# Patient Record
Sex: Male | Born: 1975
Health system: Southern US, Community
[De-identification: ages and names within clinical notes are randomized; demographics above are authoritative.]

## PROBLEM LIST (undated history)

## (undated) DIAGNOSIS — I10 Essential (primary) hypertension: Secondary | ICD-10-CM

## (undated) DIAGNOSIS — J45909 Unspecified asthma, uncomplicated: Secondary | ICD-10-CM

## (undated) DIAGNOSIS — Z9119 Patient's noncompliance with other medical treatment and regimen: Secondary | ICD-10-CM

## (undated) DIAGNOSIS — Z91199 Patient's noncompliance with other medical treatment and regimen due to unspecified reason: Secondary | ICD-10-CM

## (undated) DIAGNOSIS — I5042 Chronic combined systolic (congestive) and diastolic (congestive) heart failure: Secondary | ICD-10-CM

## (undated) DIAGNOSIS — F1910 Other psychoactive substance abuse, uncomplicated: Secondary | ICD-10-CM

## (undated) DIAGNOSIS — I2510 Atherosclerotic heart disease of native coronary artery without angina pectoris: Secondary | ICD-10-CM

## (undated) HISTORY — PX: NECK SURGERY: SHX720

## (undated) HISTORY — PX: WISDOM TOOTH EXTRACTION: SHX21

---

## 2001-04-10 ENCOUNTER — Emergency Department (HOSPITAL_COMMUNITY): Admission: EM | Admit: 2001-04-10 | Discharge: 2001-04-10 | Payer: Self-pay | Admitting: Emergency Medicine

## 2002-04-14 ENCOUNTER — Emergency Department (HOSPITAL_COMMUNITY): Admission: EM | Admit: 2002-04-14 | Discharge: 2002-04-14 | Payer: Self-pay

## 2003-06-18 ENCOUNTER — Emergency Department (HOSPITAL_COMMUNITY): Admission: EM | Admit: 2003-06-18 | Discharge: 2003-06-18 | Payer: Self-pay | Admitting: Emergency Medicine

## 2005-04-10 ENCOUNTER — Emergency Department (HOSPITAL_COMMUNITY): Admission: EM | Admit: 2005-04-10 | Discharge: 2005-04-11 | Payer: Self-pay | Admitting: Emergency Medicine

## 2007-03-03 ENCOUNTER — Emergency Department (HOSPITAL_COMMUNITY): Admission: EM | Admit: 2007-03-03 | Discharge: 2007-03-03 | Payer: Self-pay | Admitting: Emergency Medicine

## 2007-08-27 ENCOUNTER — Other Ambulatory Visit: Payer: Self-pay

## 2007-08-27 ENCOUNTER — Emergency Department: Payer: Self-pay | Admitting: Emergency Medicine

## 2011-10-02 ENCOUNTER — Encounter (HOSPITAL_COMMUNITY): Payer: Self-pay | Admitting: *Deleted

## 2011-10-02 ENCOUNTER — Emergency Department (HOSPITAL_COMMUNITY)
Admission: EM | Admit: 2011-10-02 | Discharge: 2011-10-02 | Disposition: A | Discharge status: Home / Self Care | Payer: Self-pay | Attending: Emergency Medicine | Admitting: Emergency Medicine

## 2011-10-02 DIAGNOSIS — J020 Streptococcal pharyngitis: Secondary | ICD-10-CM

## 2011-10-02 DIAGNOSIS — I10 Essential (primary) hypertension: Secondary | ICD-10-CM

## 2011-10-02 DIAGNOSIS — J029 Acute pharyngitis, unspecified: Secondary | ICD-10-CM | POA: Insufficient documentation

## 2011-10-02 LAB — RAPID STREP SCREEN (MED CTR MEBANE ONLY): Streptococcus, Group A Screen (Direct): POSITIVE — AB

## 2011-10-02 MED ORDER — PENICILLIN G BENZATHINE 1200000 UNIT/2ML IM SUSP
1.2000 10*6.[IU] | Freq: Once | INTRAMUSCULAR | Status: AC
  Administered 2011-10-02: 1.2 10*6.[IU] via INTRAMUSCULAR
  Filled 2011-10-02: qty 2

## 2011-10-02 MED ORDER — SODIUM CHLORIDE 0.9 % IV BOLUS (SEPSIS): 1000.0000 mL | Freq: Once | INTRAVENOUS | Status: DC

## 2011-10-02 MED ORDER — LIDOCAINE VISCOUS 2 % MT SOLN: 20.0000 mL | OROMUCOSAL | Status: AC | PRN

## 2011-10-02 MED ORDER — HYDROCHLOROTHIAZIDE 25 MG PO TABS: 25.0000 mg | ORAL_TABLET | Freq: Every day | ORAL | Status: DC

## 2011-10-02 MED ORDER — METOPROLOL TARTRATE 25 MG PO TABS: 25.0000 mg | ORAL_TABLET | Freq: Two times a day (BID) | ORAL | Status: DC

## 2011-10-02 NOTE — ED Provider Notes (Signed)
Medical screening examination/treatment/procedure(s) were performed by non-physician practitioner and as supervising physician I was immediately available for consultation/collaboration.   Lyanne Co, MD 10/02/11 [phone]

## 2011-10-02 NOTE — ED Notes (Signed)
C/o sore throat, fever, night sweat, chills. Out of BP med x 1 month, bp 159/110 at ED

## 2011-10-02 NOTE — Discharge Instructions (Signed)
Strep Throat Strep throat is an infection of the throat caused by a bacteria named Streptococcus pyogenes. Your caregiver may call the infection streptococcal "tonsillitis" or "pharyngitis" depending on whether there are signs of inflammation in the tonsils or back of the throat. Strep throat is most common in children from 75 to 38 years old during the cold months of the year, but it can occur in people of any age during any season. This infection is spread from person to person (contagious) through coughing, sneezing, or other close contact. SYMPTOMS   Fever or chills.   Painful, swollen, red tonsils or throat.   Pain or difficulty when swallowing.   White or yellow spots on the tonsils or throat.   Swollen, tender lymph nodes or "glands" of the neck or under the jaw.   Red rash all over the body (rare).  DIAGNOSIS  Many different infections can cause the same symptoms. A test must be done to confirm the diagnosis so the right treatment can be given. A "rapid strep test" can help your caregiver make the diagnosis in a few minutes. If this test is not available, a light swab of the infected area can be used for a throat culture test. If a throat culture test is done, results are usually available in a day or two. TREATMENT  Strep throat is treated with antibiotic medicine. HOME CARE INSTRUCTIONS   Gargle with 1 tsp of salt in 1 cup of warm water, 3 to 4 times per day or as needed for comfort.   Family members who also have a sore throat or fever should be tested for strep throat and treated with antibiotics if they have the strep infection.   Make sure everyone in your household washes their hands well.   Do not share food, drinking cups, or personal items that could cause the infection to spread to others.   You may need to eat a soft food diet until your sore throat gets better.   Drink enough water and fluids to keep your urine clear or pale yellow. This will help prevent  dehydration.   Get plenty of rest.   Stay home from school, daycare, or work until you have been on antibiotics for 24 hours.   Only take over-the-counter or prescription medicines for pain, discomfort, or fever as directed by your caregiver.   If antibiotics are prescribed, take them as directed. Finish them even if you start to feel better.  SEEK MEDICAL CARE IF:   The glands in your neck continue to enlarge.   You develop a rash, cough, or earache.   You cough up green, yellow-brown, or bloody sputum.   You have pain or discomfort not controlled by medicines.   Your problems seem to be getting worse rather than better.  SEEK IMMEDIATE MEDICAL CARE IF:   You develop any new symptoms such as vomiting, severe headache, stiff or painful neck, chest pain, shortness of breath, or trouble swallowing.   You develop severe throat pain, drooling, or changes in your voice.   You develop swelling of the neck, or the skin on the neck becomes red and tender.   You have a fever.   You develop signs of dehydration, such as fatigue, dry mouth, and decreased urination.   You become increasingly sleepy, or you cannot wake up completely.  Document Released: 03/25/2000 Document Revised: 03/17/2011 Document Reviewed: 05/27/2010 [facility] Patient Information 2012 [place], [place].   RESOURCE GUIDE  Dental Problems  Patients with Medicaid:  [facility]                     [phone] [address]                                           Phone:  [phone]                                                  If unable to pay or uninsured, contact:  [facility] or [facility] to become qualified for the adult dental clinic.  Chronic Pain Problems Contact [facility]  [phone] Patients need to be referred by their primary care doctor.  Insufficient Money for Medicine Contact [employer] Way:  call "211" or [facility] (747)383-6987.  No  Primary Care Doctor Call [facility]  [phone] Other agencies that provide inexpensive medical care    [facility]  [phone]    [facility]  [phone]    [facility]  [phone]    [facility]  [phone]    [facility]  [phone]    [facility]  [phone]  Substance Abuse Resources [facility]  [phone] [facility] [phone] [address] [phone] Floydene Flock [phone] Residential & Outpatient Substance Abuse Program  [phone]  Psychological Services [facility] Behavioral Health  [phone] [facility]  [phone] [facility] Mental Health   [phone] (emergency services 4175512906)  Abuse/Neglect [facility] Child Abuse Hotline (772) 274-6349 [facility] Child Abuse Hotline 939-158-2053 (After Hours)  Emergency Shelter [facility] [phone]  Maternity [facility] [facility] [phone] [facility] [phone]  MRSA Hotline #:   434 441 2446    [facility] Resources  [facility]  [employer]                           [facility] [address] [place]                     [address]         [address]  Blondell Reveal Phone:  [phone]                                  Phone:  [phone]                   Phone:  [phone]  [facility] Phone:  [phone]  [facility] Child Abuse Hotline 518-612-2142 [phone] (After Hours)

## 2011-10-02 NOTE — ED Provider Notes (Signed)
History     CSN: [id number]  Arrival date & time 10/02/11  1715   First MD Initiated Contact with Patient 10/02/11 1734      5:45 PM HPI Patient reports a sore throat difficulty swallowing and fever for several days. Denies cough chest pain or shortness of breath. Also reports he is out of his blood pressure medication and he does not have a primary care doctor yet.  Patient is a 36 y.o. male presenting with pharyngitis. The history is provided by the patient.  Sore Throat This is a new problem. The current episode started in the past 7 days. The problem occurs constantly. The problem has been gradually worsening. Associated symptoms include chills, a fever, neck pain and a sore throat. Pertinent negatives include no abdominal pain, congestion, coughing, fatigue, headaches, nausea, rash, vomiting or weakness. Nothing aggravates the symptoms. He has tried nothing for the symptoms.    No past medical history on file.  No past surgical history on file.  No family history on file.  History  Substance Use Topics  . Smoking status: Not on file  . Smokeless tobacco: Not on file  . Alcohol Use: Not on file      Review of Systems  Constitutional: Positive for fever and chills. Negative for fatigue.  HENT: Positive for sore throat, trouble swallowing and neck pain. Negative for congestion.   Respiratory: Negative for cough.   Gastrointestinal: Negative for nausea, vomiting and abdominal pain.  Skin: Negative for rash.  Neurological: Negative for weakness and headaches.  All other systems reviewed and are negative.    Allergies  Review of patient's allergies indicates no known allergies.  Home Medications   Current Outpatient Rx  Name Route Sig Dispense Refill  . ALBUTEROL SULFATE HFA 108 (90 BASE) MCG/ACT IN AERS Inhalation Inhale 2 puffs into the lungs every 6 (six) hours as needed. Shortness of breath      BP 159/110  Pulse 119  Temp 100.3 F (37.9 C) (Oral)  Resp 20   SpO2 98%  Physical Exam  Constitutional: He is oriented to person, place, and time. He appears well-developed and well-nourished.  HENT:  Head: Normocephalic and atraumatic.  Right Ear: Tympanic membrane, external ear and ear canal normal.  Left Ear: Tympanic membrane, external ear and ear canal normal.  Nose: Nose normal.  Mouth/Throat: Uvula is midline and mucous membranes are normal. Posterior oropharyngeal erythema present. No oropharyngeal exudate.  Eyes: Conjunctivae are normal. Pupils are equal, round, and reactive to light.  Neck: Normal range of motion. Neck supple.  Cardiovascular: Normal rate, regular rhythm and normal heart sounds.   Pulmonary/Chest: Effort normal and breath sounds normal.  Abdominal: Soft. Bowel sounds are normal.  Lymphadenopathy:    He has no cervical adenopathy.  Neurological: He is alert and oriented to person, place, and time.  Skin: Skin is warm and dry. No rash noted. No erythema. No pallor.  Psychiatric: He has a normal mood and affect. His behavior is normal.    ED Course  Procedures  Results for orders placed during the hospital encounter of 10/02/11  RAPID STREP SCREEN      Component Value Range   Streptococcus, Group A Screen (Direct) POSITIVE (*) NEGATIVE    MDM   Patient given Bicillin. Also given prescription for Lopressor and HCTZ. Have given resource guide to find primary care Patient voices understanding and is ready for d/c      Thomasene Lot, PA-C 10/02/11 1822

## 2011-11-03 ENCOUNTER — Encounter (HOSPITAL_COMMUNITY): Payer: Self-pay | Admitting: *Deleted

## 2011-11-03 ENCOUNTER — Emergency Department (INDEPENDENT_AMBULATORY_CARE_PROVIDER_SITE_OTHER)
Admission: EM | Admit: 2011-11-03 | Discharge: 2011-11-03 | Disposition: A | Discharge status: Home / Self Care | Payer: Self-pay | Source: Home / Self Care | Attending: Emergency Medicine | Admitting: Emergency Medicine

## 2011-11-03 DIAGNOSIS — N342 Other urethritis: Secondary | ICD-10-CM

## 2011-11-03 MED ORDER — AZITHROMYCIN 250 MG PO TABS
ORAL_TABLET | ORAL | Status: AC
  Filled 2011-11-03: qty 4

## 2011-11-03 MED ORDER — CEFTRIAXONE SODIUM 250 MG IJ SOLR
250.0000 mg | Freq: Once | INTRAMUSCULAR | Status: AC
  Administered 2011-11-03: 250 mg via INTRAMUSCULAR

## 2011-11-03 MED ORDER — AZITHROMYCIN 250 MG PO TABS
1000.0000 mg | ORAL_TABLET | Freq: Once | ORAL | Status: AC
  Administered 2011-11-03: 1000 mg via ORAL

## 2011-11-03 MED ORDER — CEFTRIAXONE SODIUM 250 MG IJ SOLR
INTRAMUSCULAR | Status: AC
  Filled 2011-11-03: qty 250

## 2011-11-03 NOTE — ED Notes (Signed)
Pt  Has  Symptoms  Of  Clear  Penile  Discharge    X  1  Week           Wants  To  Be  rx  For poss  std

## 2011-11-03 NOTE — ED Provider Notes (Signed)
History     CSN: [id number]  Arrival date & time 11/03/11  1904   First MD Initiated Contact with Patient 11/03/11 2004      Chief Complaint  Patient presents with  . Penile Discharge    (Consider location/radiation/quality/duration/timing/severity/associated sxs/prior treatment) HPI Comments: Patient presents tonight at urgent care concerned that he might have acquired her a sexual transmitted illness as he is aware now that she a previous girlfriend was "falling around", patient describes that about a week ago he did notice a little bit of a clear discharge but sometimes expresses a tingling sensation when he urinates and some discomfort, although he describes as not every day. He is now with a new girlfriend that has actually been seen tonight here at urgent care for some gynecological concerns. He describes it he thinks he could have been exposed to an STD and was to be treated today. Patient denies any rashes, or pelvic pain.  Patient is a 36 y.o. male presenting with penile discharge. The history is provided by the patient.  Penile Discharge This is a new problem. The current episode started more than 1 week ago. Pertinent negatives include no abdominal pain. Exacerbated by: Urinating sometimes. He has tried nothing for the symptoms. The treatment provided no relief.    History reviewed. No pertinent past medical history.  History reviewed. No pertinent past surgical history.  No family history on file.  History  Substance Use Topics  . Smoking status: Unknown If Ever Smoked  . Smokeless tobacco: Not on file  . Alcohol Use: Yes      Review of Systems  Constitutional: Negative for chills, activity change and appetite change.  Gastrointestinal: Negative for abdominal pain.  Genitourinary: Positive for discharge. Negative for urgency, decreased urine volume, penile swelling, scrotal swelling, penile pain and testicular pain.    Allergies  Review of patient's allergies  indicates no known allergies.  Home Medications   Current Outpatient Rx  Name Route Sig Dispense Refill  . ALBUTEROL SULFATE HFA 108 (90 BASE) MCG/ACT IN AERS Inhalation Inhale 2 puffs into the lungs every 6 (six) hours as needed. Shortness of breath    . HYDROCHLOROTHIAZIDE 25 MG PO TABS Oral Take 1 tablet (25 mg total) by mouth daily. 30 tablet 0  . METOPROLOL TARTRATE 25 MG PO TABS Oral Take 1 tablet (25 mg total) by mouth 2 (two) times daily. 30 tablet 0    BP 131/97  Pulse 74  Temp 98.4 F (36.9 C) (Oral)  Resp 20  SpO2 100%  Physical Exam  Vitals reviewed. Constitutional: He appears well-developed and well-nourished.  Genitourinary: Penis normal. No penile tenderness.  Skin: No rash noted. No erythema.    ED Course  Procedures (including critical care time)   Labs Reviewed  GC/CHLAMYDIA PROBE AMP, GENITAL   No results found.   1. Urethritis       MDM  Patient reports penile discharge of clear character. Patient opted and wanted to be treated empirically. DNA probe obtained for both gonorrhea and chlamydia. Patient was treated today with both azithromycin 1 g and Septra zone 250 mg IM.        Jimmie Molly, MD 11/03/11 2052

## 2012-03-12 ENCOUNTER — Encounter (HOSPITAL_COMMUNITY): Payer: Self-pay | Admitting: Emergency Medicine

## 2012-03-12 ENCOUNTER — Emergency Department (HOSPITAL_COMMUNITY)
Admission: EM | Admit: 2012-03-12 | Discharge: 2012-03-12 | Disposition: A | Discharge status: Home / Self Care | Payer: Self-pay | Attending: Emergency Medicine | Admitting: Emergency Medicine

## 2012-03-12 DIAGNOSIS — G479 Sleep disorder, unspecified: Secondary | ICD-10-CM | POA: Insufficient documentation

## 2012-03-12 DIAGNOSIS — F17200 Nicotine dependence, unspecified, uncomplicated: Secondary | ICD-10-CM | POA: Insufficient documentation

## 2012-03-12 DIAGNOSIS — R110 Nausea: Secondary | ICD-10-CM | POA: Insufficient documentation

## 2012-03-12 DIAGNOSIS — J069 Acute upper respiratory infection, unspecified: Secondary | ICD-10-CM | POA: Insufficient documentation

## 2012-03-12 DIAGNOSIS — Z79899 Other long term (current) drug therapy: Secondary | ICD-10-CM | POA: Insufficient documentation

## 2012-03-12 DIAGNOSIS — R509 Fever, unspecified: Secondary | ICD-10-CM | POA: Insufficient documentation

## 2012-03-12 DIAGNOSIS — R05 Cough: Secondary | ICD-10-CM | POA: Insufficient documentation

## 2012-03-12 DIAGNOSIS — R630 Anorexia: Secondary | ICD-10-CM | POA: Insufficient documentation

## 2012-03-12 DIAGNOSIS — J3489 Other specified disorders of nose and nasal sinuses: Secondary | ICD-10-CM | POA: Insufficient documentation

## 2012-03-12 DIAGNOSIS — J029 Acute pharyngitis, unspecified: Secondary | ICD-10-CM | POA: Insufficient documentation

## 2012-03-12 DIAGNOSIS — I10 Essential (primary) hypertension: Secondary | ICD-10-CM | POA: Insufficient documentation

## 2012-03-12 DIAGNOSIS — R059 Cough, unspecified: Secondary | ICD-10-CM | POA: Insufficient documentation

## 2012-03-12 DIAGNOSIS — R5381 Other malaise: Secondary | ICD-10-CM | POA: Insufficient documentation

## 2012-03-12 HISTORY — DX: Essential (primary) hypertension: I10

## 2012-03-12 MED ORDER — ONDANSETRON 8 MG PO TBDP
8.0000 mg | ORAL_TABLET | Freq: Once | ORAL | Status: AC
Start: 2012-03-12 — End: 2012-03-12
  Administered 2012-03-12: 8 mg via ORAL
  Filled 2012-03-12: qty 1

## 2012-03-12 MED ORDER — KETOROLAC TROMETHAMINE 30 MG/ML IJ SOLN
30.0000 mg | Freq: Once | INTRAMUSCULAR | Status: AC
  Administered 2012-03-12: 30 mg via INTRAVENOUS
  Filled 2012-03-12: qty 1

## 2012-03-12 MED ORDER — SODIUM CHLORIDE 0.9 % IV BOLUS (SEPSIS)
1000.0000 mL | Freq: Once | INTRAVENOUS | Status: AC
  Administered 2012-03-12: 1000 mL via INTRAVENOUS

## 2012-03-12 MED ORDER — DEXAMETHASONE SODIUM PHOSPHATE 10 MG/ML IJ SOLN
10.0000 mg | Freq: Once | INTRAMUSCULAR | Status: AC
  Administered 2012-03-12: 10 mg via INTRAVENOUS
  Filled 2012-03-12: qty 1

## 2012-03-12 MED ORDER — BENZOCAINE (TOPICAL) 20 % EX AERO: INHALATION_SPRAY | Freq: Four times a day (QID) | CUTANEOUS | Status: DC | PRN

## 2012-03-12 MED ORDER — PREDNISONE 10 MG PO TABS: ORAL_TABLET | ORAL | Status: DC

## 2012-03-12 MED ORDER — HYDROCODONE-HOMATROPINE 5-1.5 MG/5ML PO SYRP
5.0000 mL | ORAL_SOLUTION | Freq: Four times a day (QID) | ORAL | Status: DC | PRN
Start: 2012-03-12 — End: 2012-05-10

## 2012-03-12 NOTE — ED Notes (Signed)
Acuity changed d/t pt needing IV access for IVF and meds.  Pt unable to swallow at this time d/t sore throat.

## 2012-03-12 NOTE — ED Provider Notes (Signed)
Medical screening examination/treatment/procedure(s) were performed by non-physician practitioner and as supervising physician I was immediately available for consultation/collaboration  Lydian Chavous, MD 03/12/12 1804 

## 2012-03-12 NOTE — ED Provider Notes (Signed)
History     CSN: [id number]  Arrival date & time 03/12/12  1235   First MD Initiated Contact with Patient 03/12/12 1309      Chief Complaint  Patient presents with  . Sore Throat  . Nausea    (Consider location/radiation/quality/duration/timing/severity/associated sxs/prior treatment) The history is provided by the patient. No language interpreter was used.   36 year old male presents the emergency room with 2 days of worsening sore throat and nausea.  Patient states that he is pain with swallowing and difficulty sleeping.  He has had some subjective fevers and generalized malaise, fatigue and decreased appetite.  He denies difficulty breathing, shortness of breath.  He denies otalgia.  He does have some rhinorrhea.  He has a dry cough.  Denies fevers, chills, myalgias, arthralgias. Denies DOE, SOB, chest tightness or pressure, radiation to left arm, jaw or back, or diaphoresis. Denies dysuria, flank pain, suprapubic pain, frequency, urgency, or hematuria. Denies headaches, light headedness, weakness, visual disturbances. Denies abdominal pain, nausea, vomiting, diarrhea or constipation.    Past Medical History  Diagnosis Date  . Hypertension     History reviewed. No pertinent past surgical history.  No family history on file.  History  Substance Use Topics  . Smoking status: Current Every Day Smoker -- 1.0 packs/day    Types: Cigarettes  . Smokeless tobacco: Not on file  . Alcohol Use: Yes     Comment: occassionally      Review of Systems Ten systems are reviewed and are negative for acute change except as noted in the HPI.   Allergies  Review of patient's allergies indicates no known allergies.  Home Medications   Current Outpatient Rx  Name  Route  Sig  Dispense  Refill  . ALBUTEROL SULFATE HFA 108 (90 BASE) MCG/ACT IN AERS   Inhalation   Inhale 2 puffs into the lungs every 6 (six) hours as needed. Shortness of breath         . HYDROCHLOROTHIAZIDE 25 MG  PO TABS   Oral   Take 1 tablet (25 mg total) by mouth daily.   30 tablet   0   . METOPROLOL TARTRATE 25 MG PO TABS   Oral   Take 1 tablet (25 mg total) by mouth 2 (two) times daily.   30 tablet   0     BP 140/104  Pulse 110  Temp 98.9 F (37.2 C) (Oral)  Resp 20  SpO2 97%  Physical Exam  HENT:  Head: No trismus in the jaw.  Mouth/Throat: Uvula is midline. No uvula swelling. Oropharyngeal exudate and posterior oropharyngeal erythema present. No posterior oropharyngeal edema or tonsillar abscesses.   Physical Exam  Nursing note and vitals reviewed. Constitutional: He appears well-developed and well-nourished. No distress.  HENT:  Head: Normocephalic and atraumatic.  Eyes: Conjunctivae normal are normal. No scleral icterus.  Neck: Normal range of motion. Neck supple.  Cardiovascular: Normal rate, regular rhythm and normal heart sounds.   Pulmonary/Chest: Effort normal and breath sounds normal. No respiratory distress.  Abdominal: Soft. There is no tenderness.  Musculoskeletal: He exhibits no edema.  Neurological: He is alert.  Skin: Skin is warm and dry. He is not diaphoretic.  Psychiatric: His behavior is normal.     ED Course  Procedures (including critical care time)   Labs Reviewed  RAPID STREP SCREEN   No results found.   1. Sore throat   2. URI (upper respiratory infection)       MDM  Filed Vitals:   03/12/12 1254 03/12/12 1400  BP: 140/104 147/98  Pulse: 110 103  Temp: 98.9 F (37.2 C)   TempSrc: Oral   Resp: 20 20  SpO2: 97% 100%   Patient with some HTN and tachycardia. He was given decadron and Toradol along with a liter of fluid.  He had significant relief of symptoms.  Patient's pulse was 80 by my palpation just before discharge.  I will treat patient with supportive care and symptom relief.  Discussed return precautions.         Arthor Captain, PA-C 03/12/12 1628

## 2012-03-12 NOTE — ED Notes (Signed)
Pt presenting to ed with c/o sore throat pt states difficulty with swallowing pt states he has positive nausea no vomiting pt states his stomach feels upset but denies pain. Pt denies diarrhea at this time. Pt states onset x 2 days

## 2012-03-12 NOTE — ED Notes (Signed)
Pt reports sore throat x 2 days with difficulty swallowing and nausea.

## 2012-03-12 NOTE — Progress Notes (Signed)
CARE MANAGEMENT ED NOTE 03/12/2012  Patient:  Tim [name], Tim [name]   Account Number:  [id number]  Date Initiated:  03/12/2012  Documentation initiated by:  Edd Arbour  Subjective/Objective Assessment:   36 yr old male self pay     Subjective/Objective Assessment Detail:   [facility] liaison spoke with him Pt refused offered  [facility] services to assist with finding a [place] self pay provider     Action/Plan:   referral to [facility] liaison   Action/Plan Detail:   Anticipated DC Date:  03/12/2012     Status Recommendation to Physician:   Result of Recommendation:    Other ED Services  Consult Working Plan    DC Planning Services  Other  GCCN / P4[facility] (established/new)  PCP issues  Outpatient Services - Pt will follow up    Choice offered to / List presented to:            Status of service:  Completed, signed off  ED Comments:   ED Comments Detail:

## 2012-05-10 ENCOUNTER — Encounter (HOSPITAL_COMMUNITY): Payer: Self-pay | Admitting: Emergency Medicine

## 2012-05-10 ENCOUNTER — Emergency Department (HOSPITAL_COMMUNITY)
Admission: EM | Admit: 2012-05-10 | Discharge: 2012-05-10 | Disposition: A | Discharge status: Home / Self Care | Payer: Self-pay | Attending: Emergency Medicine | Admitting: Emergency Medicine

## 2012-05-10 DIAGNOSIS — I10 Essential (primary) hypertension: Secondary | ICD-10-CM | POA: Insufficient documentation

## 2012-05-10 DIAGNOSIS — F17200 Nicotine dependence, unspecified, uncomplicated: Secondary | ICD-10-CM | POA: Insufficient documentation

## 2012-05-10 DIAGNOSIS — R369 Urethral discharge, unspecified: Secondary | ICD-10-CM | POA: Insufficient documentation

## 2012-05-10 DIAGNOSIS — Z79899 Other long term (current) drug therapy: Secondary | ICD-10-CM | POA: Insufficient documentation

## 2012-05-10 MED ORDER — LIDOCAINE HCL (PF) 1 % IJ SOLN
INTRAMUSCULAR | Status: AC
  Administered 2012-05-10: 2 mL
  Filled 2012-05-10: qty 5

## 2012-05-10 MED ORDER — AZITHROMYCIN 250 MG PO TABS
1000.0000 mg | ORAL_TABLET | Freq: Once | ORAL | Status: AC
  Administered 2012-05-10: 1000 mg via ORAL
  Filled 2012-05-10: qty 4

## 2012-05-10 MED ORDER — CEFTRIAXONE SODIUM 250 MG IJ SOLR
250.0000 mg | Freq: Once | INTRAMUSCULAR | Status: AC
  Administered 2012-05-10: 250 mg via INTRAMUSCULAR
  Filled 2012-05-10: qty 250

## 2012-05-10 MED ORDER — METRONIDAZOLE 500 MG PO TABS
2000.0000 mg | ORAL_TABLET | Freq: Once | ORAL | Status: AC
  Administered 2012-05-10: 2000 mg via ORAL
  Filled 2012-05-10: qty 4

## 2012-05-10 NOTE — ED Notes (Addendum)
Pt requesting STD check. Pt reports had sex some time in October and the condom came off. Pt reports having clear discharge from penis ever since. Pt also request prescription for his blood pressure medications.

## 2012-05-10 NOTE — ED Provider Notes (Signed)
History     CSN: [id number]  Arrival date & time 05/10/12  1030   First MD Initiated Contact with Patient 05/10/12 1045      Chief Complaint  Patient presents with  . Penile Discharge    (Consider location/radiation/quality/duration/timing/severity/associated sxs/prior treatment) HPI Tim [name] is a 37 y.o. male who presents with complaint of clear penile discharge. States symptoms started about a week ago. States no pain with urination, no penile pain, no pain with intercourse, no pain or swelling of the scrotum. Nothing makes symptoms better or worse. States his girlfriend is having vaginal discharge, and they are both here to be evaluated. Pt has no other complaints.  Past Medical History  Diagnosis Date  . Hypertension     History reviewed. No pertinent past surgical history.  No family history on file.  History  Substance Use Topics  . Smoking status: Current Every Day Smoker -- 1.0 packs/day    Types: Cigarettes  . Smokeless tobacco: Not on file  . Alcohol Use: Yes     Comment: occassionally      Review of Systems  Constitutional: Negative for fever and chills.  Respiratory: Negative.   Cardiovascular: Negative.   Gastrointestinal: Negative for abdominal pain.  Genitourinary: Positive for discharge. Negative for dysuria, hematuria, flank pain, penile swelling, scrotal swelling, penile pain and testicular pain.    Allergies  Review of patient's allergies indicates no known allergies.  Home Medications   Current Outpatient Rx  Name  Route  Sig  Dispense  Refill  . ALBUTEROL SULFATE HFA 108 (90 BASE) MCG/ACT IN AERS   Inhalation   Inhale 2 puffs into the lungs every 6 (six) hours as needed. Shortness of breath         . BENZOCAINE 20 % EX AERO   Topical   Apply topically 4 (four) times daily as needed for pain.   57 mL   0   . HYDROCHLOROTHIAZIDE 25 MG PO TABS   Oral   Take 1 tablet (25 mg total) by mouth daily.   30 tablet   0   .  HYDROCODONE-HOMATROPINE 5-1.5 MG/5ML PO SYRP   Oral   Take 5 mLs by mouth every 6 (six) hours as needed for cough.   120 mL   0   . METOPROLOL TARTRATE 25 MG PO TABS   Oral   Take 1 tablet (25 mg total) by mouth 2 (two) times daily.   30 tablet   0   . PREDNISONE 10 MG PO TABS      Take 3 tablets starting wed 12/4. Take 2 tablets every day for the next 2 days Take 1 tablet  For the last 2 days.   9 tablet   0     BP 141/87  Pulse 86  Temp 97.4 F (36.3 C) (Oral)  Resp 18  SpO2 99%  Physical Exam  Nursing note and vitals reviewed. Constitutional: He is oriented to person, place, and time. He appears well-developed and well-nourished. No distress.  Cardiovascular: Normal rate, regular rhythm and normal heart sounds.   Pulmonary/Chest: Effort normal and breath sounds normal. No respiratory distress. He has no wheezes. He has no rales.  Abdominal: Soft. Bowel sounds are normal. He exhibits no distension. There is no tenderness.  Genitourinary: No penile tenderness.       Normal bilateral scrotum and testes. Clear penile discharge  Neurological: He is alert and oriented to person, place, and time.  Skin: Skin is warm and  dry.  Psychiatric: He has a normal mood and affect.    ED Course  Procedures (including critical care time)    1. Penile discharge       MDM  Pt with clear penile discharge. GC/chlamydia cultures sent. He is otherwise non toxic. No pain in the scrotum or testes. Treated for potential STD with rocephin 250mg  IM and zithromax 1g PO. Will d/c home with follow up.         Lottie Mussel, PA 05/10/12 1559

## 2012-05-10 NOTE — ED Notes (Signed)
CC as per triage.

## 2012-05-11 LAB — GC/CHLAMYDIA PROBE AMP
CT Probe RNA: NEGATIVE
GC Probe RNA: NEGATIVE

## 2012-05-11 NOTE — ED Provider Notes (Signed)
Medical screening examination/treatment/procedure(s) were performed by non-physician practitioner and as supervising physician I was immediately available for consultation/collaboration.   Hanya Guerin, MD 05/11/12 0908 

## 2014-02-05 ENCOUNTER — Encounter (HOSPITAL_COMMUNITY): Payer: Self-pay | Admitting: Emergency Medicine

## 2014-02-05 ENCOUNTER — Other Ambulatory Visit (HOSPITAL_COMMUNITY)
Admission: RE | Admit: 2014-02-05 | Discharge: 2014-02-05 | Disposition: A | Discharge status: Home / Self Care | Payer: Self-pay | Source: Ambulatory Visit | Attending: Emergency Medicine | Admitting: Emergency Medicine

## 2014-02-05 ENCOUNTER — Emergency Department (INDEPENDENT_AMBULATORY_CARE_PROVIDER_SITE_OTHER)
Admission: EM | Admit: 2014-02-05 | Discharge: 2014-02-05 | Disposition: A | Discharge status: Home / Self Care | Payer: Self-pay | Source: Home / Self Care | Attending: Emergency Medicine | Admitting: Emergency Medicine

## 2014-02-05 DIAGNOSIS — R369 Urethral discharge, unspecified: Secondary | ICD-10-CM

## 2014-02-05 DIAGNOSIS — Z113 Encounter for screening for infections with a predominantly sexual mode of transmission: Secondary | ICD-10-CM | POA: Insufficient documentation

## 2014-02-05 DIAGNOSIS — G8929 Other chronic pain: Secondary | ICD-10-CM

## 2014-02-05 DIAGNOSIS — M25531 Pain in right wrist: Secondary | ICD-10-CM

## 2014-02-05 DIAGNOSIS — I10 Essential (primary) hypertension: Secondary | ICD-10-CM

## 2014-02-05 LAB — POCT URINALYSIS DIP (DEVICE)
BILIRUBIN URINE: NEGATIVE
Glucose, UA: NEGATIVE mg/dL
HGB URINE DIPSTICK: NEGATIVE
KETONES UR: NEGATIVE mg/dL
LEUKOCYTES UA: NEGATIVE
Nitrite: NEGATIVE
PH: 6.5 (ref 5.0–8.0)
Protein, ur: NEGATIVE mg/dL
SPECIFIC GRAVITY, URINE: 1.02 (ref 1.005–1.030)
Urobilinogen, UA: 1 mg/dL (ref 0.0–1.0)

## 2014-02-05 MED ORDER — AZITHROMYCIN 250 MG PO TABS
1000.0000 mg | ORAL_TABLET | Freq: Once | ORAL | Status: AC
  Administered 2014-02-05: 1000 mg via ORAL

## 2014-02-05 MED ORDER — AZITHROMYCIN 250 MG PO TABS
ORAL_TABLET | ORAL | Status: AC
  Filled 2014-02-05: qty 4

## 2014-02-05 MED ORDER — CEFTRIAXONE SODIUM 250 MG IJ SOLR
INTRAMUSCULAR | Status: AC
  Filled 2014-02-05: qty 250

## 2014-02-05 MED ORDER — MELOXICAM 15 MG PO TABS: 7.5000 mg | ORAL_TABLET | Freq: Every day | ORAL | Status: DC

## 2014-02-05 MED ORDER — AMLODIPINE BESYLATE 10 MG PO TABS: 10.0000 mg | ORAL_TABLET | Freq: Every day | ORAL | Status: DC

## 2014-02-05 MED ORDER — LIDOCAINE HCL (PF) 1 % IJ SOLN
INTRAMUSCULAR | Status: AC
  Filled 2014-02-05: qty 5

## 2014-02-05 MED ORDER — CEFTRIAXONE SODIUM 250 MG IJ SOLR
250.0000 mg | Freq: Once | INTRAMUSCULAR | Status: AC
  Administered 2014-02-05: 250 mg via INTRAMUSCULAR

## 2014-02-05 MED ORDER — HYDROCODONE-ACETAMINOPHEN 5-325 MG PO TABS
1.0000 | ORAL_TABLET | Freq: Four times a day (QID) | ORAL | Status: DC | PRN
Start: 2014-02-05 — End: 2017-08-31

## 2014-02-05 NOTE — ED Notes (Signed)
Pt reports  Pain  r    Wrist        X  1  Year      States  Old  Fracture  In past   Pt  Stated   Did  Not  Follow the  Plan of  Care  At that time    -            Pt  Also   Has  High  Blood pressure in  Past      Not  On  Any      meds      At this  Time        No      followup         He  Stated       Pt  Also  Reports      Clear  Penile  Discharge  For  sev        For   Several  Weeks

## 2014-02-05 NOTE — Discharge Instructions (Signed)
Wrist pain - Take meloxicam daily as needed.  Do not take with ibuprofen, advil, aleve, or motrin. - Use the norco as needed for severe pain. - Ice the wrist 2-3 times a day.  Discharge - We treated you today for gonorrhea and chlamydia. - If any of your tests come back positive, we will call you.  Blood pressure - Take amlodipine 10mg  once a day. - Please work on getting a primary care provider who can monitor your blood pressure.  Primary Care Offices that work with patients without [employer] [facility] - [phone] [facility] [phone] Internal Medicine - [phone]

## 2014-02-05 NOTE — ED Provider Notes (Signed)
CSN: [id number]     Arrival date & time 02/05/14  1406 History   First MD Initiated Contact with Patient 02/05/14 1723     Chief Complaint  Patient presents with  . Wrist Pain   (Consider location/radiation/quality/duration/timing/severity/associated sxs/prior Treatment) HPI He is a 38 year old man here for evaluation of right wrist pain, high blood pressure, penile discharge.  He reports having right wrist pain for over one year. About a year ago [place] and [place] done. They told him that one of the bones in his wrist had been broken and healed incorrectly. He states the wrist was doing well until about a month ago when it started to hurt. The last day or 2 it has been worse, with swelling. The pain is located on the radial aspect of the wrist. It will radiate to the ulnar side. He states he has been doing more heavy lifting in the last month. He denies any weakness, numbness, tingling in his hand. He does sometimes have a numb sensation in his wrist. Pain is worse with movement of the wrist. He has tried Tylenol and Motrin with minimal help. He has been icing the wrist which has helped with the swelling.  He states he has a history of high blood pressure. He has not been on any medication for over 18 months. He denies any chest pain, shortness of breath, headaches, vision changes.  He describes a clear penile discharge for the last 3 weeks. He denies any dysuria, pelvic pain, fevers, chills. He denies any rashes or ulcers on his genitals. He states he had similar symptoms several years ago when he was diagnosed with chlamydia.  Past Medical History  Diagnosis Date  . Hypertension    History reviewed. No pertinent past surgical history. History reviewed. No pertinent family history. History  Substance Use Topics  . Smoking status: Current Every Day Smoker -- 1.00 packs/day    Types: Cigarettes  . Smokeless tobacco: Not on file  . Alcohol Use: Yes    Comment: occassionally    Review of Systems  Constitutional: Negative.   HENT: Negative.   Respiratory: Negative.   Cardiovascular: Negative.   Gastrointestinal: Negative.   Genitourinary: Positive for discharge. Negative for dysuria, frequency, hematuria, penile pain and testicular pain.  Musculoskeletal:       Right wrist pain    Allergies  Review of patient's allergies indicates no known allergies.  Home Medications   Prior to Admission medications   Medication Sig Start Date End Date Taking? Authorizing Provider  albuterol (PROVENTIL HFA;VENTOLIN HFA) 108 (90 BASE) MCG/ACT inhaler Inhale 2 puffs into the lungs every 6 (six) hours as needed. Shortness of breath    Historical Provider, MD  amLODipine (NORVASC) 10 MG tablet Take 1 tablet (10 mg total) by mouth daily. 02/05/14   Charm RingsErin J Cashius Grandstaff, MD  HYDROcodone-acetaminophen (NORCO) 5-325 MG per tablet Take 1 tablet by mouth every 6 (six) hours as needed for moderate pain. 02/05/14   Charm RingsErin J Elliyah Liszewski, MD  meloxicam (MOBIC) 15 MG tablet Take 0.5 tablets (7.5 mg total) by mouth daily. 02/05/14   Charm RingsErin J Myrtle Haller, MD   BP 155/116  Pulse 71  Temp(Src) 98.1 F (36.7 C) (Oral)  Resp 16  Ht 6\' 2"  (1.88 m)  Wt 240 lb (108.863 kg)  BMI 30.80 kg/m2  SpO2 100% Physical Exam  Constitutional: He is oriented to person, place, and time. He appears well-developed and well-nourished. No distress.  HENT:  Head: Normocephalic and atraumatic.  Neck: Normal range of motion.  Cardiovascular: Normal rate, regular rhythm and normal heart sounds.   No murmur heard. Pulmonary/Chest: Effort normal and breath sounds normal. No respiratory distress. He has no wheezes. He has no rales.  Musculoskeletal:  Right wrist: Minimal swelling.  No erythema.  Full range of motion but with pain (both active and passive).  5/5 grip strength.  2+ radial pulse.  Brisk cap refill in digits.  Neurological: He is alert and oriented to person, place, and time.  Skin: Skin is  warm and dry.    ED Course  Procedures (including critical care time) Labs Review Labs Reviewed  URINALYSIS, DIPSTICK ONLY  POCT URINALYSIS DIP (DEVICE)  URINE CYTOLOGY ANCILLARY ONLY    Imaging Review No results found.   MDM   1. Chronic wrist pain, right   2. Essential hypertension   3. Penile discharge    Discussed symptomatic treatment of the wrist pain with meloxicam and ice. Provided prescription for Norco 5-3 25 mg, #20 to use as needed for pain. Strongly encouraged him to follow-up with an orthopedic surgeon to discuss definitive management.  We'll start amlodipine 10 mg daily for his high blood pressure. Provided phone numbers for several primary care offices that work with uninsured patients.  We'll presumptively treat him for gonorrhea and chlamydia with Rocephin 250 mg IM and azithromycin 1 g by mouth. Urine sent for gonorrhea and chlamydia. He declined HIV and RPR, stating he had these done within the last year.  Follow-up as needed.    Charm RingsErin J Chaye Misch, MD 02/05/14 1739

## 2014-02-06 LAB — URINE CYTOLOGY ANCILLARY ONLY
Chlamydia: NEGATIVE
Neisseria Gonorrhea: NEGATIVE

## 2014-06-19 ENCOUNTER — Other Ambulatory Visit (HOSPITAL_COMMUNITY)
Admission: RE | Admit: 2014-06-19 | Discharge: 2014-06-19 | Disposition: A | Discharge status: Home / Self Care | Payer: Self-pay | Source: Ambulatory Visit | Attending: Family Medicine | Admitting: Family Medicine

## 2014-06-19 ENCOUNTER — Emergency Department (INDEPENDENT_AMBULATORY_CARE_PROVIDER_SITE_OTHER)
Admission: EM | Admit: 2014-06-19 | Discharge: 2014-06-19 | Disposition: A | Discharge status: Home / Self Care | Payer: Self-pay | Source: Home / Self Care | Attending: Family Medicine | Admitting: Family Medicine

## 2014-06-19 ENCOUNTER — Encounter (HOSPITAL_COMMUNITY): Payer: Self-pay | Admitting: Emergency Medicine

## 2014-06-19 DIAGNOSIS — I10 Essential (primary) hypertension: Secondary | ICD-10-CM

## 2014-06-19 DIAGNOSIS — N342 Other urethritis: Secondary | ICD-10-CM

## 2014-06-19 DIAGNOSIS — M25531 Pain in right wrist: Secondary | ICD-10-CM

## 2014-06-19 DIAGNOSIS — Z113 Encounter for screening for infections with a predominantly sexual mode of transmission: Secondary | ICD-10-CM | POA: Insufficient documentation

## 2014-06-19 DIAGNOSIS — G8929 Other chronic pain: Secondary | ICD-10-CM

## 2014-06-19 MED ORDER — LIDOCAINE HCL (PF) 1 % IJ SOLN
INTRAMUSCULAR | Status: AC
  Filled 2014-06-19: qty 5

## 2014-06-19 MED ORDER — CEFTRIAXONE SODIUM 250 MG IJ SOLR
250.0000 mg | Freq: Once | INTRAMUSCULAR | Status: AC
  Administered 2014-06-19: 250 mg via INTRAMUSCULAR

## 2014-06-19 MED ORDER — AZITHROMYCIN 250 MG PO TABS
1000.0000 mg | ORAL_TABLET | Freq: Once | ORAL | Status: AC
  Administered 2014-06-19: 1000 mg via ORAL

## 2014-06-19 MED ORDER — CEFTRIAXONE SODIUM 250 MG IJ SOLR
INTRAMUSCULAR | Status: AC
  Filled 2014-06-19: qty 250

## 2014-06-19 MED ORDER — AMLODIPINE BESYLATE 10 MG PO TABS: 10.0000 mg | ORAL_TABLET | Freq: Every day | ORAL | Status: DC

## 2014-06-19 MED ORDER — AZITHROMYCIN 250 MG PO TABS
ORAL_TABLET | ORAL | Status: AC
  Filled 2014-06-19: qty 4

## 2014-06-19 NOTE — ED Notes (Signed)
Reports recently released from prison.   Pt is c/o right wrist pain with swelling. Unable to lift things with right hand.  States was to have surgery after release from prison but had no one to follow up with.  C/o  Penile discharge.  Nausea.  X 2 wks.  Possible exposure to std's.      Pt was taking BP meds but does not remember name.  Pt has been with out for several months.

## 2014-06-19 NOTE — ED Provider Notes (Signed)
CSN: [id number]     Arrival date & time 06/19/14  1200 History   First MD Initiated Contact with Patient 06/19/14 1324     Chief Complaint  Patient presents with  . Exposure to STD  . Hypertension  . Wrist Pain   (Consider location/radiation/quality/duration/timing/severity/associated sxs/prior Treatment) HPI Comments: Patient is a 39 year old man here for evaluation of chronic right wrist pain, high blood pressure, penile discharge.  He reports having right wrist pain for over one year. About a year ago he was seen by a physician in [place] while in prison and had an x-ray done. They told him that one of the bones in his wrist had been broken and healed incorrectly. The pain is located on the radial aspect of the wrist. It will radiate to the ulnar side.  He denies any weakness, numbness, tingling in his hand. He does sometimes have a numb sensation in his wrist. Pain is worse with movement of the wrist. He has tried Tylenol and Motrin with minimal help. Was seen for same at [facility] in Oct. 2015 and referred to orthopedist and has yet to follow up with suggested provider.  He states he has a history of high blood pressure. He has not been on any medication since a limited prescription of amlodipine was provided in Oct. 2015. At that time patient was provided with printed information regarding low cost options for primary care and he has yet to pursue any options for establishment with primary care provider. He denies any chest pain, shortness of breath, headaches, vision changes.  He describes a clear penile discharge for the last 2 weeks following unprotected intercourse. He denies any dysuria, pelvic pain, fevers, chills. He denies any rashes or ulcers on his genitals. He states he had similar symptoms several years ago when he was diagnosed with chlamydia.  Patient is a 39 y.o. male presenting with STD exposure, hypertension, and wrist pain. The history is provided by the patient.  Exposure to  STD  Hypertension  Wrist Pain    Past Medical History  Diagnosis Date  . Hypertension    History reviewed. No pertinent past surgical history. History reviewed. No pertinent family history. History  Substance Use Topics  . Smoking status: Current Every Day Smoker -- 1.00 packs/day    Types: Cigarettes  . Smokeless tobacco: Not on file  . Alcohol Use: Yes     Comment: occassionally    Review of Systems  All other systems reviewed and are negative.   Allergies  Review of patient's allergies indicates no known allergies.  Home Medications   Prior to Admission medications   Medication Sig Start Date End Date Taking? Authorizing Provider  albuterol (PROVENTIL HFA;VENTOLIN HFA) 108 (90 BASE) MCG/ACT inhaler Inhale 2 puffs into the lungs every 6 (six) hours as needed. Shortness of breath    Historical Provider, MD  amLODipine (NORVASC) 10 MG tablet Take 1 tablet (10 mg total) by mouth daily. 06/19/14   Audelia Hives Brahim Dolman, PA  HYDROcodone-acetaminophen (NORCO) 5-325 MG per tablet Take 1 tablet by mouth every 6 (six) hours as needed for moderate pain. 02/05/14   Melony Overly, MD  meloxicam (MOBIC) 15 MG tablet Take 0.5 tablets (7.5 mg total) by mouth daily. 02/05/14   Melony Overly, MD   BP 175/112 mmHg  Pulse 84  Temp(Src) 98.2 F (36.8 C) (Oral)  Resp 16  SpO2 100% Physical Exam  Constitutional: He appears well-developed and well-nourished. No distress.  Nursing note and vitals  reviewed. Physical Exam  Constitutional: He is oriented to person, place, and time. He appears well-developed and well-nourished. No distress.  HENT: normal Head: Normocephalic and atraumatic.  Neck: Normal range of motion.  Cardiovascular: Normal rate, regular rhythm and normal heart sounds.  No murmur heard. Pulmonary/Chest: Effort normal and breath sounds normal. No respiratory distress. He has no wheezes. He has no rales.  Musculoskeletal:  Right wrist: No swelling. No erythema.  Full range of motion but with pain (both active and passive). 5/5 grip strength. 2+ radial pulse. Brisk cap refill in digits. No CSM deficits. Neurological: He is alert and oriented to person, place, and time.  Skin: Skin is warm and dry.   ED Course  Procedures (including critical care time) Labs Review Labs Reviewed  URINE CYTOLOGY ANCILLARY ONLY    Imaging Review No results found.   MDM   1. Essential hypertension   2. Urethritis   3. Chronic wrist pain, right    Discussed symptomatic treatment of the wrist pain with tylenol or ibuprofen and ice. Strongly encouraged him to follow-up with an orthopedic surgeon or sports medicine to discuss definitive management. Ambulatory referral sent to [place] Patient met with patient access counselor while in clinic today  We'll restart amlodipine 10 mg daily for his high blood pressure. Provided phone numbers for several primary care offices that work with uninsured patients.  We'll presumptively treat him for gonorrhea and chlamydia with Rocephin 250 mg IM and azithromycin 1 g by mouth. Advised regarding safe sex practices Urine sent for gonorrhea and chlamydia. He declined HIV and RPR testing, stating he had these done within the last year.   Lutricia Feil, [place] 06/19/14 1410

## 2014-06-19 NOTE — Discharge Instructions (Signed)
Chronic Pain Chronic pain can be defined as pain that is off and on and lasts for 3-6 months or longer. Many things cause chronic pain, which can make it difficult to make a diagnosis. There are many treatment options available for chronic pain. However, finding a treatment that works well for you may require trying various approaches until the right one is found. Many people benefit from a combination of two or more types of treatment to control their pain. SYMPTOMS  Chronic pain can occur anywhere in the body and can range from mild to very severe. Some types of chronic pain include:  Headache.  Low back pain.  Cancer pain.  Arthritis pain.  Neurogenic pain. This is pain resulting from damage to nerves. People with chronic pain may also have other symptoms such as:  Depression.  Anger.  Insomnia.  Anxiety. DIAGNOSIS  Your health care provider will help diagnose your condition over time. In many cases, the initial focus will be on excluding possible conditions that could be causing the pain. Depending on your symptoms, your health care provider may order tests to diagnose your condition. Some of these tests may include:   Blood tests.   CT scan.   MRI.   X-rays.   Ultrasounds.   Nerve conduction studies.  You may need to see a specialist.  TREATMENT  Finding treatment that works well may take time. You may be referred to a pain specialist. He or she may prescribe medicine or therapies, such as:   Mindful meditation or yoga.  Shots (injections) of numbing or pain-relieving medicines into the spine or area of pain.  Local electrical stimulation.  Acupuncture.   Massage therapy.   Aroma, color, light, or sound therapy.   Biofeedback.   Working with a physical therapist to keep from getting stiff.   Regular, gentle exercise.   Cognitive or behavioral therapy.   Group support.  Sometimes, surgery may be recommended.  HOME CARE INSTRUCTIONS    Take all medicines as directed by your health care provider.   Lessen stress in your life by relaxing and doing things such as listening to calming music.   Exercise or be active as directed by your health care provider.   Eat a healthy diet and include things such as vegetables, fruits, fish, and lean meats in your diet.   Keep all follow-up appointments with your health care provider.   Attend a support group with others suffering from chronic pain. SEEK MEDICAL CARE IF:   Your pain gets worse.   You develop a new pain that was not there before.   You cannot tolerate medicines given to you by your health care provider.   You have new symptoms since your last visit with your health care provider.  SEEK IMMEDIATE MEDICAL CARE IF:   You feel weak.   You have decreased sensation or numbness.   You lose control of bowel or bladder function.   Your pain suddenly gets much worse.   You develop shaking.  You develop chills.  You develop confusion.  You develop chest pain.  You develop shortness of breath.  MAKE SURE YOU:  Understand these instructions.  Will watch your condition.  Will get help right away if you are not doing well or get worse. Document Released: 12/18/2001 Document Revised: 11/28/2012 Document Reviewed: 09/21/2012 [facility] Patient Information 2015 [place], [place]. This information is not intended to replace advice given to you by your health care provider. Make sure you discuss any  questions you have with your health care provider.  Hypertension Hypertension, commonly called high blood pressure, is when the force of blood pumping through your arteries is too strong. Your arteries are the blood vessels that carry blood from your heart throughout your body. A blood pressure reading consists of a higher number over a lower number, such as 110/72. The higher number (systolic) is the pressure inside your arteries when your heart pumps. The  lower number (diastolic) is the pressure inside your arteries when your heart relaxes. Ideally you want your blood pressure below 120/80. Hypertension forces your heart to work harder to pump blood. Your arteries may become narrow or stiff. Having hypertension puts you at risk for heart disease, stroke, and other problems.  RISK FACTORS Some risk factors for high blood pressure are controllable. Others are not.  Risk factors you cannot control include:   Race. You may be at higher risk if you are African American.  Age. Risk increases with age.  Gender. Men are at higher risk than women before age 68 years. After age 17, women are at higher risk than men. Risk factors you can control include:  Not getting enough exercise or physical activity.  Being overweight.  Getting too much fat, sugar, calories, or salt in your diet.  Drinking too much alcohol. SIGNS AND SYMPTOMS Hypertension does not usually cause signs or symptoms. Extremely high blood pressure (hypertensive crisis) may cause headache, anxiety, shortness of breath, and nosebleed. DIAGNOSIS  To check if you have hypertension, your health care provider will measure your blood pressure while you are seated, with your arm held at the level of your heart. It should be measured at least twice using the same arm. Certain conditions can cause a difference in blood pressure between your right and left arms. A blood pressure reading that is higher than normal on one occasion does not mean that you need treatment. If one blood pressure reading is high, ask your health care provider about having it checked again. TREATMENT  Treating high blood pressure includes making lifestyle changes and possibly taking medicine. Living a healthy lifestyle can help lower high blood pressure. You may need to change some of your habits. Lifestyle changes may include:  Following the DASH diet. This diet is high in fruits, vegetables, and whole grains. It is low  in salt, red meat, and added sugars.  Getting at least 2 hours of brisk physical activity every week.  Losing weight if necessary.  Not smoking.  Limiting alcoholic beverages.  Learning ways to reduce stress. If lifestyle changes are not enough to get your blood pressure under control, your health care provider may prescribe medicine. You may need to take more than one. Work closely with your health care provider to understand the risks and benefits. HOME CARE INSTRUCTIONS  Have your blood pressure rechecked as directed by your health care provider.   Take medicines only as directed by your health care provider. Follow the directions carefully. Blood pressure medicines must be taken as prescribed. The medicine does not work as well when you skip doses. Skipping doses also puts you at risk for problems.   Do not smoke.   Monitor your blood pressure at home as directed by your health care provider. SEEK MEDICAL CARE IF:   You think you are having a reaction to medicines taken.  You have recurrent headaches or feel dizzy.  You have swelling in your ankles.  You have trouble with your vision. [place]  CARE IF:  You develop a severe headache or confusion.  You have unusual weakness, numbness, or feel faint.  You have severe chest or abdominal pain.  You vomit repeatedly.  You have trouble breathing. MAKE SURE YOU:   Understand these instructions.  Will watch your condition.  Will get help right away if you are not doing well or get worse. Document Released: 03/28/2005 Document Revised: 08/12/2013 Document Reviewed: 01/18/2013 [facility] Patient Information 2015 [place], [place]. This information is not intended to replace advice given to you by your health care provider. Make sure you discuss any questions you have with your health care provider.  Managing Your High Blood Pressure Blood pressure is a measurement of how forceful your blood is pressing  against the walls of the arteries. Arteries are muscular tubes within the circulatory system. Blood pressure does not stay the same. Blood pressure rises when you are active, excited, or nervous; and it lowers during sleep and relaxation. If the numbers measuring your blood pressure stay above normal most of the time, you are at risk for health problems. High blood pressure (hypertension) is a long-term (chronic) condition in which blood pressure is elevated. A blood pressure reading is recorded as two numbers, such as 120 over 80 (or 120/80). The first, higher number is called the systolic pressure. It is a measure of the pressure in your arteries as the heart beats. The second, lower number is called the diastolic pressure. It is a measure of the pressure in your arteries as the heart relaxes between beats.  Keeping your blood pressure in a normal range is important to your overall health and prevention of health problems, such as heart disease and stroke. When your blood pressure is uncontrolled, your heart has to work harder than normal. High blood pressure is a very common condition in adults because blood pressure tends to rise with age. Men and women are equally likely to have hypertension but at different times in life. Before age 56, men are more likely to have hypertension. After 41 years of age, women are more likely to have it. Hypertension is especially common in African Americans. This condition often has no signs or symptoms. The cause of the condition is usually not known. Your caregiver can help you come up with a plan to keep your blood pressure in a normal, healthy range. BLOOD PRESSURE STAGES Blood pressure is classified into four stages: normal, prehypertension, stage 1, and stage 2. Your blood pressure reading will be used to determine what type of treatment, if any, is necessary. Appropriate treatment options are tied to these four stages:  Normal  Systolic pressure (mm Hg): below  120.  Diastolic pressure (mm Hg): below 80. Prehypertension  Systolic pressure (mm Hg): 120 to 139.  Diastolic pressure (mm Hg): 80 to 89. Stage1  Systolic pressure (mm Hg): 140 to 159.  Diastolic pressure (mm Hg): 90 to 99. Stage2  Systolic pressure (mm Hg): 160 or above.  Diastolic pressure (mm Hg): 100 or above. RISKS RELATED TO HIGH BLOOD PRESSURE Managing your blood pressure is an important responsibility. Uncontrolled high blood pressure can lead to:  A heart attack.  A stroke.  A weakened blood vessel (aneurysm).  Heart failure.  Kidney damage.  Eye damage.  Metabolic syndrome.  Memory and concentration problems. HOW TO MANAGE YOUR BLOOD PRESSURE Blood pressure can be managed effectively with lifestyle changes and medicines (if needed). Your caregiver will help you come up with a plan to bring your blood pressure  within a normal range. Your plan should include the following: Education  Read all information provided by your caregivers about how to control blood pressure.  Educate yourself on the latest guidelines and treatment recommendations. New research is always being done to further define the risks and treatments for high blood pressure. Lifestylechanges  Control your weight.  Avoid smoking.  Stay physically active.  Reduce the amount of salt in your diet.  Reduce stress.  Control any chronic conditions, such as high cholesterol or diabetes.  Reduce your alcohol intake. Medicines  Several medicines (antihypertensive medicines) are available, if needed, to bring blood pressure within a normal range. Communication  Review all the medicines you take with your caregiver because there may be side effects or interactions.  Talk with your caregiver about your diet, exercise habits, and other lifestyle factors that may be contributing to high blood pressure.  See your caregiver regularly. Your caregiver can help you create and adjust your plan  for managing high blood pressure. RECOMMENDATIONS FOR TREATMENT AND FOLLOW-UP  The following recommendations are based on current guidelines for managing high blood pressure in nonpregnant adults. Use these recommendations to identify the proper follow-up period or treatment option based on your blood pressure reading. You can discuss these options with your caregiver.  Systolic pressure of 120 to 139 or diastolic pressure of 80 to 89: Follow up with your caregiver as directed.  Systolic pressure of 140 to 160 or diastolic pressure of 90 to 100: Follow up with your caregiver within 2 months.  Systolic pressure above 160 or diastolic pressure above 100: Follow up with your caregiver within 1 month.  Systolic pressure above 180 or diastolic pressure above 110: Consider antihypertensive therapy; follow up with your caregiver within 1 week.  Systolic pressure above 200 or diastolic pressure above 120: Begin antihypertensive therapy; follow up with your caregiver within 1 week. Document Released: 12/21/2011 Document Reviewed: 12/21/2011 [facility] Patient Information 2015 [place], [place]. This information is not intended to replace advice given to you by your health care provider. Make sure you discuss any questions you have with your health care provider.  Urethritis Urethritis is an inflammation of the tube through which urine exits your bladder (urethra).  CAUSES Urethritis is often caused by an infection in your urethra. The infection can be viral, like herpes. The infection can also be bacterial, like gonorrhea. RISK FACTORS Risk factors of urethritis include:  Having sex without using a condom.  Having multiple sexual partners.  Having poor hygiene. SIGNS AND SYMPTOMS Symptoms of urethritis are less noticeable in women than in men. These symptoms include:  Burning feeling when you urinate (dysuria).  Discharge from your urethra.  Blood in your urine (hematuria).  Urinating more  than usual. DIAGNOSIS  To confirm a diagnosis of urethritis, your health care provider will do the following:  Ask about your sexual history.  Perform a physical exam.  Have you provide a sample of your urine for lab testing.  Use a cotton swab to gently collect a sample from your urethra for lab testing. TREATMENT  It is important to treat urethritis. Depending on the cause, untreated urethritis may lead to serious genital infections and possibly infertility. Urethritis caused by a bacterial infection is treated with antibiotic medicine. All sexual partners must be treated.  HOME CARE INSTRUCTIONS  Do not have sex until the test results are known and treatment is completed, even if your symptoms go away before you finish treatment.  If you were prescribed  an antibiotic, finish it all even if you start to feel better. SEEK MEDICAL CARE IF:   Your symptoms are not improved in 3 days.  Your symptoms are getting worse.  You develop abdominal pain or pelvic pain (in women).  You develop joint pain.  You have a fever. SEEK IMMEDIATE MEDICAL CARE IF:   You have severe pain in the belly, back, or side.  You have repeated vomiting. MAKE SURE YOU:  Understand these instructions.  Will watch your condition.  Will get help right away if you are not doing well or get worse. Document Released: 09/21/2000 Document Revised: 08/12/2013 Document Reviewed: 11/26/2012 [facility] Patient Information 2015 [place]. This information is not intended to replace advice given to you by your health care provider. Make sure you discuss any questions you have with your health care provider.

## 2014-06-20 LAB — URINE CYTOLOGY ANCILLARY ONLY
Chlamydia: NEGATIVE
Neisseria Gonorrhea: NEGATIVE
Trichomonas: NEGATIVE

## 2016-02-02 ENCOUNTER — Encounter (HOSPITAL_COMMUNITY): Payer: Self-pay | Admitting: Emergency Medicine

## 2016-02-02 ENCOUNTER — Emergency Department (HOSPITAL_COMMUNITY)
Admission: EM | Admit: 2016-02-02 | Discharge: 2016-02-02 | Disposition: A | Discharge status: Home / Self Care | Payer: Self-pay | Attending: Emergency Medicine | Admitting: Emergency Medicine

## 2016-02-02 DIAGNOSIS — Z23 Encounter for immunization: Secondary | ICD-10-CM | POA: Insufficient documentation

## 2016-02-02 DIAGNOSIS — Y939 Activity, unspecified: Secondary | ICD-10-CM | POA: Insufficient documentation

## 2016-02-02 DIAGNOSIS — Y999 Unspecified external cause status: Secondary | ICD-10-CM | POA: Insufficient documentation

## 2016-02-02 DIAGNOSIS — I10 Essential (primary) hypertension: Secondary | ICD-10-CM | POA: Insufficient documentation

## 2016-02-02 DIAGNOSIS — Y929 Unspecified place or not applicable: Secondary | ICD-10-CM | POA: Insufficient documentation

## 2016-02-02 DIAGNOSIS — F17210 Nicotine dependence, cigarettes, uncomplicated: Secondary | ICD-10-CM | POA: Insufficient documentation

## 2016-02-02 DIAGNOSIS — S51811A Laceration without foreign body of right forearm, initial encounter: Secondary | ICD-10-CM | POA: Insufficient documentation

## 2016-02-02 MED ORDER — BACITRACIN ZINC 500 UNIT/GM EX OINT
TOPICAL_OINTMENT | CUTANEOUS | Status: AC
  Filled 2016-02-02: qty 1.8

## 2016-02-02 MED ORDER — LIDOCAINE-EPINEPHRINE (PF) 2 %-1:200000 IJ SOLN
20.0000 mL | Freq: Once | INTRAMUSCULAR | Status: AC
  Administered 2016-02-02: 20 mL

## 2016-02-02 MED ORDER — TETANUS-DIPHTH-ACELL PERTUSSIS 5-2.5-18.5 LF-MCG/0.5 IM SUSP
0.5000 mL | Freq: Once | INTRAMUSCULAR | Status: AC
  Administered 2016-02-02: 0.5 mL via INTRAMUSCULAR
  Filled 2016-02-02: qty 0.5

## 2016-02-02 MED ORDER — LIDOCAINE-EPINEPHRINE (PF) 2 %-1:200000 IJ SOLN
INTRAMUSCULAR | Status: DC
Start: 2016-02-02 — End: 2016-02-02
  Filled 2016-02-02: qty 20
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
## 2016-02-02 NOTE — ED Provider Notes (Signed)
[facility]-EMERGENCY DEPT Provider Note   CSN: [id number] Arrival date & time: 02/02/16  1513     History   Chief Complaint Chief Complaint  Patient presents with  . Extremity Laceration    HPI Tim [name] is a 40 y.o. male.  The history is provided by the patient.  Laceration   The incident occurred less than 1 hour ago. The laceration is located on the right arm. The laceration is 3 cm in size. The laceration mechanism was a a razor. The pain is mild. He reports no foreign bodies present. His tetanus status is unknown.    Past Medical History:  Diagnosis Date  . Hypertension     There are no active problems to display for this patient.   History reviewed. No pertinent surgical history.     Home Medications    Prior to Admission medications   Medication Sig Start Date End Date Taking? Authorizing Provider  amLODipine (NORVASC) 10 MG tablet Take 1 tablet (10 mg total) by mouth daily. Patient not taking: Reported on 02/02/2016 06/19/14   Ria ClockJennifer Lee H Presson, PA  HYDROcodone-acetaminophen (NORCO) 5-325 MG per tablet Take 1 tablet by mouth every 6 (six) hours as needed for moderate pain. Patient not taking: Reported on 02/02/2016 02/05/14   Charm RingsErin J Honig, MD  meloxicam (MOBIC) 15 MG tablet Take 0.5 tablets (7.5 mg total) by mouth daily. Patient not taking: Reported on 02/02/2016 02/05/14   Charm RingsErin J Honig, MD    Family History No family history on file.  Social History Social History  Substance Use Topics  . Smoking status: Current Every Day Smoker    Packs/day: 1.00    Types: Cigarettes  . Smokeless tobacco: Not on file  . Alcohol use Yes     Comment: occassionally     Allergies   Review of patient's allergies indicates no known allergies.   Review of Systems Review of Systems  Skin: Positive for wound.     Physical Exam Updated Vital Signs There were no vitals taken for this visit.  Physical Exam  Constitutional: He is oriented to person,  place, and time. He appears well-developed and well-nourished. No distress.  HENT:  Head: Normocephalic and atraumatic.  Right Ear: External ear normal.  Left Ear: External ear normal.  Nose: Nose normal.  Mouth/Throat: Mucous membranes are normal. No trismus in the jaw.  Eyes: Conjunctivae and EOM are normal. No scleral icterus.  Neck: Normal range of motion and phonation normal.  Cardiovascular: Normal rate and regular rhythm.   Pulmonary/Chest: Effort normal. No stridor. No respiratory distress.  Abdominal: He exhibits no distension.  Musculoskeletal: Normal range of motion. He exhibits no edema.  Neurological: He is alert and oriented to person, place, and time.  Skin: Laceration (3 cm superficial laceration to proximal right forearm. no FB. no joint involvement) noted. He is not diaphoretic.  Psychiatric: He has a normal mood and affect. His behavior is normal.  Vitals reviewed.    ED Treatments / Results  Labs (all labs ordered are listed, but only abnormal results are displayed) Labs Reviewed - No data to display  EKG  EKG Interpretation None       Radiology No results found.  Procedures .Marland Kitchen.Laceration Repair Date/Time: 02/02/2016 5:13 PM Performed by: Nira ConnARDAMA, Aleka Twitty EDUARDO Authorized by: Nira ConnARDAMA, Derrico Zhong EDUARDO   Consent:    Consent obtained:  Verbal   Consent given by:  Patient   Risks discussed:  Pain and poor cosmetic result   Alternatives discussed:  No treatment Anesthesia (see MAR for exact dosages):    Anesthesia method:  Local infiltration   Local anesthetic:  Lidocaine 1% WITH epi Laceration details:    Location:  Shoulder/arm   Shoulder/arm location:  R lower arm   Length (cm):  3 Repair type:    Repair type:  Simple Pre-procedure details:    Preparation:  Patient was prepped and draped in usual sterile fashion Exploration:    Hemostasis achieved with:  Direct pressure   Wound exploration: wound explored through full range of motion and  entire depth of wound probed and visualized     Wound extent: no foreign bodies/material noted, no muscle damage noted, no nerve damage noted, no tendon damage noted and no vascular damage noted     Contaminated: no   Treatment:    Area cleansed with:  Betadine   Amount of cleaning:  Extensive   Irrigation solution:  Sterile saline   Irrigation volume:  500   Irrigation method:  Syringe   Visualized foreign bodies/material removed: no   Skin repair:    Repair method:  Sutures   Suture size:  4-0   Suture material:  Prolene   Suture technique:  Horizontal mattress   Number of sutures:  4 Approximation:    Approximation:  Close   Vermilion border: well-aligned   Post-procedure details:    Dressing:  Antibiotic ointment and non-adherent dressing   Patient tolerance of procedure:  Tolerated well, no immediate complications   (including critical care time)  Medications Ordered in ED Medications  lidocaine-EPINEPHrine (XYLOCAINE W/EPI) 2 %-1:200000 (PF) injection 20 mL (not administered)  lidocaine-EPINEPHrine (XYLOCAINE W/EPI) 2 %-1:200000 (PF) injection (not administered)  bacitracin 500 UNIT/GM ointment (not administered)  Tdap (BOOSTRIX) injection 0.5 mL (0.5 mLs Intramuscular Given 02/02/16 1525)     Initial Impression / Assessment and Plan / ED Course  I have reviewed the triage vital signs and the nursing notes.  Pertinent labs & imaging results that were available during my care of the patient were reviewed by me and considered in my medical decision making (see chart for details).  Clinical Course    Forearm laceration. Tetanus updated. Thoroughly irrigated and closed as above. No other injuries on exam. Patient to follow-up in 10-14 days for suture removal.  The patient is safe for discharge with strict return precautions.   Final Clinical Impressions(s) / ED Diagnoses   Final diagnoses:  Laceration of right forearm, initial encounter   Disposition:  Discharge  Condition: Good  I have discussed the results, Dx and Tx plan with the patient who expressed understanding and agree(s) with the plan. Discharge instructions discussed at great length. The patient was given strict return precautions who verbalized understanding of the instructions. No further questions at time of discharge.    Current Discharge Medication List      Follow Up: [facility] [address] [id number] mc [place] [address] [phone]  For suture removal      Nira Conn, MD 02/02/16 [phone]

## 2016-02-22 ENCOUNTER — Encounter (HOSPITAL_COMMUNITY): Payer: Self-pay | Admitting: *Deleted

## 2016-02-22 ENCOUNTER — Emergency Department (HOSPITAL_COMMUNITY)
Admission: EM | Admit: 2016-02-22 | Discharge: 2016-02-22 | Disposition: A | Discharge status: Home / Self Care | Payer: Self-pay | Attending: Emergency Medicine | Admitting: Emergency Medicine

## 2016-02-22 DIAGNOSIS — F17210 Nicotine dependence, cigarettes, uncomplicated: Secondary | ICD-10-CM | POA: Insufficient documentation

## 2016-02-22 DIAGNOSIS — I10 Essential (primary) hypertension: Secondary | ICD-10-CM | POA: Insufficient documentation

## 2016-02-22 DIAGNOSIS — Z4802 Encounter for removal of sutures: Secondary | ICD-10-CM | POA: Insufficient documentation

## 2016-02-22 DIAGNOSIS — Z79899 Other long term (current) drug therapy: Secondary | ICD-10-CM | POA: Insufficient documentation

## 2016-02-22 NOTE — Discharge Instructions (Signed)
Keep wound clean and keep area covered/protected from the sun for the next 1 year to help with the scar healing process. Ice and elevate for additional pain relief and swelling. Alternate between Ibuprofen and Tylenol for additional pain relief. Your blood pressure was noted to be high, eat a low salt diet. [facility] next 1-2 weeks to establish care and for ongoing management of your high blood pressure. Return to emergency department for emergent changing or worsening symptoms.

## 2016-02-22 NOTE — ED Provider Notes (Signed)
[facility]-EMERGENCY DEPT Provider Note   CSN: [id number] Arrival date & time: 02/22/16  1120  By signing my name below, I, Tim [name], attest that this documentation has been prepared under the direction and in the presence of Courvoisier Hamblen Camprubi-Soms, PA-C. Electronically Signed: Placido SouLogan [name], ED Scribe. 02/22/16. 12:07 PM.   History   Chief Complaint Chief Complaint  Patient presents with  . Suture / Staple Removal    HPI HPI Comments: Tim GrieveOctavius K [name] is a 40 y.o. male who presents to the Emergency Department requesting removal of four sutures which were placed on his right lower arm on 02/02/2016. Pt reports mild itching across the site but denies any pain, redness, warmth, or drainage from the area, or fevers/chills. Of note, pt confirms his h/o HTN (160/113 mmHg in triage) and denies he has a regular PCP or takes medication for his HTN, requesting referral to PCP today so he can have ongoing management of this issue. He denies any other associated symptoms at this time.   The history is provided by the patient and medical records. No language interpreter was used.  Suture / Staple Removal  This is a new problem. The current episode started more than 1 week ago. The problem occurs rarely. The problem has been resolved. Nothing aggravates the symptoms. Nothing relieves the symptoms. He has tried nothing for the symptoms. The treatment provided no relief.    Past Medical History:  Diagnosis Date  . Hypertension     There are no active problems to display for this patient.   History reviewed. No pertinent surgical history.   Home Medications    Prior to Admission medications   Medication Sig Start Date End Date Taking? Authorizing Provider  amLODipine (NORVASC) 10 MG tablet Take 1 tablet (10 mg total) by mouth daily. Patient not taking: Reported on 02/02/2016 06/19/14   Ria ClockJennifer Lee H Presson, PA  HYDROcodone-acetaminophen (NORCO) 5-325 MG per tablet Take 1 tablet by mouth  every 6 (six) hours as needed for moderate pain. Patient not taking: Reported on 02/02/2016 02/05/14   Charm RingsErin J Honig, MD  meloxicam (MOBIC) 15 MG tablet Take 0.5 tablets (7.5 mg total) by mouth daily. Patient not taking: Reported on 02/02/2016 02/05/14   Charm RingsErin J Honig, MD    Family History No family history on file.  Social History Social History  Substance Use Topics  . Smoking status: Current Every Day Smoker    Packs/day: 1.00    Types: Cigarettes  . Smokeless tobacco: Never Used  . Alcohol use Yes     Comment: occassionally     Allergies   Patient has no known allergies.   Review of Systems Review of Systems  Constitutional: Negative for chills and fever.  Musculoskeletal: Negative for arthralgias and myalgias.  Skin: Positive for wound. Negative for color change.  Allergic/Immunologic: Negative for immunocompromised state.    Physical Exam Updated Vital Signs BP (!) 160/113 (BP Location: Left Arm)   Pulse 93   Temp 98.1 F (36.7 C) (Oral)   Resp 18   SpO2 97%   Physical Exam  Constitutional: He is oriented to person, place, and time. Vital signs are normal. He appears well-developed and well-nourished.  Non-toxic appearance. No distress.  Afebrile, nontoxic, NAD  HENT:  Head: Normocephalic and atraumatic.  Mouth/Throat: Mucous membranes are normal.  Eyes: Conjunctivae and EOM are normal. Right eye exhibits no discharge. Left eye exhibits no discharge.  Neck: Normal range of motion. Neck supple.  Cardiovascular: Normal rate and intact  distal pulses.   Pulmonary/Chest: Effort normal. No respiratory distress.  Abdominal: Normal appearance. He exhibits no distension.  Musculoskeletal: Normal range of motion.  Neurological: He is alert and oriented to person, place, and time. He has normal strength. No sensory deficit.  Skin: Skin is warm, dry and intact. No rash noted.  Right forearm with well healed wound, 4 sutures intact, no surrounding erythema or warmth, no  drainage  Psychiatric: He has a normal mood and affect.  Nursing note and vitals reviewed.  ED Treatments / Results  Labs (all labs ordered are listed, but only abnormal results are displayed) Labs Reviewed - No data to display  EKG  EKG Interpretation None       Radiology No results found.  Procedures .Suture Removal Date/Time: 02/22/2016 12:12 PM Performed by: Marjean DonnaAMPRUBI-SOMS, Caleen Taaffe Authorized by: Allen DerryAMPRUBI-SOMS, Geraldyne Barraclough   Consent:    Consent obtained:  Verbal   Consent given by:  Patient   Risks discussed:  Pain and wound separation Location:    Location:  Upper extremity   Upper extremity location:  Arm   Arm location:  R lower arm Procedure details:    Wound appearance:  No signs of infection   Number of sutures removed:  4 Post-procedure details:    Patient tolerance of procedure:  Tolerated well, no immediate complications    DIAGNOSTIC STUDIES: Oxygen Saturation is 97% on RA, normal by my interpretation.    COORDINATION OF CARE: 12:07 PM Discussed next steps with pt. Pt verbalized understanding and is agreeable with the plan.    Medications Ordered in ED Medications - No data to display   Initial Impression / Assessment and Plan / ED Course  I have reviewed the triage vital signs and the nursing notes.  Pertinent labs & imaging results that were available during my care of the patient were reviewed by me and considered in my medical decision making (see chart for details).  Clinical Course     40 y.o. male here for staple/suture removal and wound check as above. Wound well appearing, ready for suture removal. Procedure tolerated well. Noted to have BP elevated, discussed f/up with [facility] to establish care for ongoing management of this. Scar minimization & return precautions given at dc. I explained the diagnosis and have given explicit precautions to return to the ER including for any other new or worsening symptoms. The patient understands and  accepts the medical plan as it's been dictated and I have answered their questions. Discharge instructions concerning home care and prescriptions have been given. The patient is STABLE and is discharged to home in good condition.   I personally performed the services described in this documentation, which was scribed in my presence. The recorded information has been reviewed and is accurate.   Final Clinical Impressions(s) / ED Diagnoses   Final diagnoses:  Visit for suture removal  Hypertension, unspecified type    New Prescriptions New Prescriptions   No medications on file     Allen DerryMercedes Camprubi-Soms, PA-C 02/22/16 1217    Tilden FossaElizabeth Rees, MD 02/24/16 [phone]

## 2016-02-22 NOTE — ED Triage Notes (Signed)
Pt here for suture removal from right forearm. Pt had 4 sutures placed 2 weeks ago. Pt states he has mild itching, denies pain.

## 2016-02-29 ENCOUNTER — Emergency Department (HOSPITAL_COMMUNITY)
Admission: EM | Admit: 2016-02-29 | Discharge: 2016-02-29 | Disposition: A | Discharge status: Home / Self Care | Payer: Self-pay | Attending: Emergency Medicine | Admitting: Emergency Medicine

## 2016-02-29 ENCOUNTER — Emergency Department (HOSPITAL_COMMUNITY): Payer: Self-pay

## 2016-02-29 ENCOUNTER — Encounter (HOSPITAL_COMMUNITY): Payer: Self-pay | Admitting: Emergency Medicine

## 2016-02-29 DIAGNOSIS — Y929 Unspecified place or not applicable: Secondary | ICD-10-CM | POA: Insufficient documentation

## 2016-02-29 DIAGNOSIS — Y939 Activity, unspecified: Secondary | ICD-10-CM | POA: Insufficient documentation

## 2016-02-29 DIAGNOSIS — Z79899 Other long term (current) drug therapy: Secondary | ICD-10-CM | POA: Insufficient documentation

## 2016-02-29 DIAGNOSIS — F17210 Nicotine dependence, cigarettes, uncomplicated: Secondary | ICD-10-CM | POA: Insufficient documentation

## 2016-02-29 DIAGNOSIS — Y999 Unspecified external cause status: Secondary | ICD-10-CM | POA: Insufficient documentation

## 2016-02-29 DIAGNOSIS — M79641 Pain in right hand: Secondary | ICD-10-CM

## 2016-02-29 DIAGNOSIS — I10 Essential (primary) hypertension: Secondary | ICD-10-CM | POA: Insufficient documentation

## 2016-02-29 DIAGNOSIS — S61451A Open bite of right hand, initial encounter: Secondary | ICD-10-CM | POA: Insufficient documentation

## 2016-02-29 DIAGNOSIS — W540XXA Bitten by dog, initial encounter: Secondary | ICD-10-CM | POA: Insufficient documentation

## 2016-02-29 MED ORDER — HYDROCODONE-ACETAMINOPHEN 5-325 MG PO TABS
1.0000 | ORAL_TABLET | Freq: Once | ORAL | Status: AC
  Administered 2016-02-29: 1 via ORAL
  Filled 2016-02-29: qty 1

## 2016-02-29 MED ORDER — RABIES VACCINE, PCEC IM SUSR
1.0000 mL | Freq: Once | INTRAMUSCULAR | Status: AC
  Administered 2016-02-29: 1 mL via INTRAMUSCULAR
  Filled 2016-02-29: qty 1

## 2016-02-29 MED ORDER — AMOXICILLIN-POT CLAVULANATE 875-125 MG PO TABS: 1.0000 | ORAL_TABLET | Freq: Two times a day (BID) | ORAL | 0 refills | Status: DC

## 2016-02-29 MED ORDER — HYDROCODONE-ACETAMINOPHEN 5-325 MG PO TABS: 1.0000 | ORAL_TABLET | Freq: Four times a day (QID) | ORAL | 0 refills | Status: DC | PRN

## 2016-02-29 MED ORDER — RABIES IMMUNE GLOBULIN 150 UNIT/ML IM INJ
20.0000 [IU]/kg | INJECTION | Freq: Once | INTRAMUSCULAR | Status: AC
  Administered 2016-02-29: 2325 [IU] via INTRAMUSCULAR
  Filled 2016-02-29: qty 15.5

## 2016-02-29 MED ORDER — NAPROXEN 500 MG PO TABS: 500.0000 mg | ORAL_TABLET | Freq: Two times a day (BID) | ORAL | 0 refills | Status: DC | PRN

## 2016-02-29 NOTE — ED Triage Notes (Signed)
Two puncture wounds to r/hand, -bleeding controlled. Pt stated that he was trying to break up a dog fight and the "pit bull" bit into r/hand, r/index linger. Large amount of swelling noted in r/hand and finger. Unable to move r/index finger. Does not know anything about the dog.

## 2016-02-29 NOTE — ED Provider Notes (Signed)
[facility] DEPT Provider Note   CSN: [id number] Arrival date & time: 02/29/16  1237   By signing my name below, I, Teofilo PodMatthew P. Jamison, attest that this documentation has been prepared under the direction and in the presence of [address]Lola Czerwonka, [employer] CorporationPA-C. Electronically Signed: Teofilo PodMatthew P. Jamison, ED Scribe. 02/29/2016. 1:04 PM.   History   Chief Complaint Chief Complaint  Patient presents with  . Animal Bite    dog bite to r/hand  . Hand Pain    puncture wounds to r/hand    The history is provided by the patient and medical records. No language interpreter was used.  Animal Bite  Contact animal:  Dog Location:  Hand Hand injury location:  R hand Time since incident:  2 hours Pain details:    Quality:  Aching and burning   Severity:  Severe   Timing:  Constant   Progression:  Unchanged Incident location:  Outside Provoked: provoked (breaking up a fight)   Notifications:  None Animal's rabies vaccination status:  Unknown Animal in possession: no   Tetanus status:  Up to date Relieved by:  Nothing Worsened by:  Activity Ineffective treatments:  None tried Associated symptoms: swelling   Associated symptoms: no numbness    HPI Comments:  Judie GrieveOctavius K Rogoff is a 40 y.o. male with a PMHx of HTN, who presents to the Emergency Department for evaluation of a dog bite to his right hand that he sustained 2 hours ago. Pt reports that he was trying to separate 2 dogs from fighting and another person's pit bull bit the lateral aspect of his right hand. Pt notes puncture wounds to the palm and dorsum of his right hand. Pt is unsure if the dog is up to date on its shots, and the animal is not in his possession; he doesn't know the owner; animal control has not been contacted. Pt describes the pain as 10/10 constant, "aching, burning" non-radiating R hand pain, worse with hand movement, and has used peroxide to clean the wound but with no other tx tried PTA. No other alleviating factors  noted. Pt reports associated weakness due to pain and swelling to the bite area. Tetanus updated 02/02/16. No known allergies to medication. Pt denies numbness, tingling, or bruising. Denies other injuries or complaints.    Past Medical History:  Diagnosis Date  . Hypertension     There are no active problems to display for this patient.   Past Surgical History:  Procedure Laterality Date  . WISDOM TOOTH EXTRACTION         Home Medications    Prior to Admission medications   Medication Sig Start Date End Date Taking? Authorizing Provider  amLODipine (NORVASC) 10 MG tablet Take 1 tablet (10 mg total) by mouth daily. Patient not taking: Reported on 02/02/2016 06/19/14   Ria ClockJennifer Lee H Presson, PA  HYDROcodone-acetaminophen (NORCO) 5-325 MG per tablet Take 1 tablet by mouth every 6 (six) hours as needed for moderate pain. Patient not taking: Reported on 02/02/2016 02/05/14   Charm RingsErin J Honig, MD  meloxicam (MOBIC) 15 MG tablet Take 0.5 tablets (7.5 mg total) by mouth daily. Patient not taking: Reported on 02/02/2016 02/05/14   Charm RingsErin J Honig, MD    Family History Family History  Problem Relation Age of Onset  . Hypertension Mother     Social History Social History  Substance Use Topics  . Smoking status: Current Every Day Smoker    Packs/day: 1.00    Types: Cigarettes  . Smokeless  tobacco: Never Used  . Alcohol use Yes     Comment: occassionally     Allergies   Patient has no known allergies.   Review of Systems Review of Systems  Musculoskeletal: Positive for arthralgias and joint swelling.  Skin: Positive for wound. Negative for color change.  Allergic/Immunologic: Negative for immunocompromised state.  Neurological: Positive for weakness (Due to pain). Negative for numbness.  Hematological: Does not bruise/bleed easily.    Physical Exam Updated Vital Signs BP (!) 185/121 (BP Location: Left Arm)   Pulse 83   Temp 98.3 F (36.8 C) (Oral)   Resp 18   Ht 6'  2" (1.88 m)   Wt 260 lb (117.9 kg)   SpO2 100%   BMI 33.38 kg/m  Recheck VS @ 1:21 PM: BP (!) 171/130   Physical Exam  Constitutional: He is oriented to person, place, and time. Vital signs are normal. He appears well-developed and well-nourished.  Non-toxic appearance. No distress.  Afebrile, nontoxic, NAD although appears to be in pain during evaluation of R hand; mildly hypertensive, similar to prior exams, and likely related to pain.   HENT:  Head: Normocephalic and atraumatic.  Mouth/Throat: Mucous membranes are normal.  Eyes: Conjunctivae and EOM are normal. Right eye exhibits no discharge. Left eye exhibits no discharge.  Neck: Normal range of motion. Neck supple.  Cardiovascular: Normal rate and intact distal pulses.   Pulmonary/Chest: Effort normal. No respiratory distress.  Abdominal: Normal appearance. He exhibits no distension.  Musculoskeletal:       Right hand: He exhibits decreased range of motion (due to pain), tenderness, bony tenderness, laceration and swelling. He exhibits normal capillary refill and no deformity. Normal sensation noted.  Right hand with moderate swelling around the thenar eminence, with 1 puncture wound to palmar aspect and 1 puncture wound to dorsal aspect of hand, bleeding controlled, with no bruising or erythema, no warmth, no crepitus or deformity felt, with moderate TTP along the first and second metacarpal region, and mildly limited ROM in the first and second digits due to pain although still able to flex and extend at all finger joints. Sensation grossly intact, distal pulses intact, grip strength mildly diminished due to pain, soft compartments. SEE PICTURES BELOW  Neurological: He is alert and oriented to person, place, and time. He has normal strength. No sensory deficit.  Skin: Skin is warm and dry. Laceration noted. No rash noted.  Right hand puncture wounds as mentioned above and pictured below  Psychiatric: He has a normal mood and affect.    Nursing note and vitals reviewed.        ED Treatments / Results  DIAGNOSTIC STUDIES:  Oxygen Saturation is 100% on RA, normal by my interpretation.    COORDINATION OF CARE:  1:04 PM Discussed treatment plan with pt at bedside and pt agreed to plan.   Labs (all labs ordered are listed, but only abnormal results are displayed) Labs Reviewed - No data to display  EKG  EKG Interpretation None       Radiology Dg Hand Complete Right  Result Date: 02/29/2016 CLINICAL DATA:  Dog bite to right hand between first and second fingers. EXAM: RIGHT HAND - COMPLETE 3+ VIEW COMPARISON:  None. FINDINGS: No acute bony abnormality. Specifically, no fracture, subluxation, or dislocation. Soft tissues are intact. No radiopaque foreign bodies. IMPRESSION: No acute bony abnormality or radiopaque foreign body. Electronically Signed   By: Charlett Nose M.D.   On: 02/29/2016 13:55    Procedures Procedures (  including critical care time)  Medications Ordered in ED Medications  rabies vaccine (RABAVERT) injection 1 mL (not administered)  rabies immune globulin (HYPERAB) injection 2,325 Units (not administered)  HYDROcodone-acetaminophen (NORCO/VICODIN) 5-325 MG per tablet 1 tablet (1 tablet Oral Given 02/29/16 1322)     Initial Impression / Assessment and Plan / ED Course  I have reviewed the triage vital signs and the nursing notes.  Pertinent labs & imaging results that were available during my care of the patient were reviewed by me and considered in my medical decision making (see chart for details).  Clinical Course     40 y.o. male here with dog bite to R hand sustained just PTA. Unknown if dog is UTD, unable to get ahold of the dog's owner, animal control not yet contacted. Will start rabies vaccines, discussed that if he can get ahold of the dog owner and the dog is UTD then he could discontinue shots, but if unable to do this then needs to complete rabies course. Tetanus UTD.  Moderate swelling and tenderness to R hand, 2 puncture wounds noted. Will irrigate thoroughly, give pain meds, and obtain xray, then reassess. Of note, pt hypertensive, likely from pain; will recheck BP shortly.   2:12 PM Xray neg. Wound irrigated with copious amount of saline through the puncture wounds. Will start on augmentin. Pain med rx given, discussed RICE therapy, f/up with [facility] or [facility] in 2-3 days for wound recheck and for ongoing management of injury, as well as [facility] so he can have ongoing management of his medical problems/HTN. Of note, BP improving on recheck after pain control. Discussed DASH diet. F/up with [facility] for rabies shots as previously mentioned. F/up with hand specialist in 1wk for recheck and ongoing management of hand injury. I explained the diagnosis and have given explicit precautions to return to the ER including for any other new or worsening symptoms. The patient understands and accepts the medical plan as it's been dictated and I have answered their questions. Discharge instructions concerning home care and prescriptions have been given. The patient is STABLE and is discharged to home in good condition.    I personally performed the services described in this documentation, which was scribed in my presence. The recorded information has been reviewed and is accurate.   Final Clinical Impressions(s) / ED Diagnoses   Final diagnoses:  Dog bite, initial encounter  Right hand pain  Essential hypertension    New Prescriptions New Prescriptions   AMOXICILLIN-CLAVULANATE (AUGMENTIN) 875-125 MG TABLET    Take 1 tablet by mouth 2 (two) times daily. One po bid x 7 days   HYDROCODONE-ACETAMINOPHEN (NORCO) 5-325 MG TABLET    Take 1 tablet by mouth every 6 (six) hours as needed for severe pain.   NAPROXEN (NAPROSYN) 500 MG TABLET    Take 1 tablet (500 mg total) by mouth 2 (two) times daily as needed for mild pain, moderate pain or headache (TAKE WITH  MEALS.).     Taima Rada Camprubi-Soms, PA-C 02/29/16 1414    Lorre NickAnthony Allen, MD 02/29/16 1700

## 2016-02-29 NOTE — Discharge Instructions (Signed)
Keep wounds clean with mild soap and water. Keep area covered with a topical antibiotic ointment and bandage, keep bandage dry. May use ace wrap to help with compression which will help with swelling. Ice and elevate for additional pain relief and swelling. Alternate between naprosyn and norco for additional pain relief, but don't drive while taking norco. Take antibiotic as directed and until completed. [facility] your primary care doctor or [facility] in approximately 2-3 days for wound recheck, and as directed on the schedule listed on the rabies hand out for your ongoing rabies shots (unless animal control can find the dog and quarantine it; keep in touch with animal control to find out if you need to continue your rabies shots). Follow up with [facility] in 1 week for recheck and to establish medical care, for ongoing management of your blood pressure and medical problems. Eat a low salt diet to help with your blood pressure control. Follow up with the hand specialist in 1 week for recheck of hand injury and ongoing management of injury. Monitor area for signs of infection to include, but not limited to: increasing pain, spreading redness, drainage/pus, worsening swelling, or fevers. Return to emergency department for emergent changing or worsening symptoms.

## 2016-02-29 NOTE — ED Triage Notes (Signed)
Pt tolerated rabies injections without reaction. Wound care reviewed with pt/family. Ice pack given with instructions. Animal control form completed at bedside.  Pt reports no relief from pain with paim medication

## 2016-03-02 NOTE — Progress Notes (Signed)
[facility] from [facility]'s wife.  Patient's wife reports unable to afford Augmentin and Naproxen.  EDCM found coupons from [email or web address] to assist with medication cost of which patient's wife was agreeable.  EDCM called and [facility] who provided fax number [phone].  [facility] with [facility] of receipt.  EDCM left voice message informing patient of above.  No further EDCM needs at this time.

## 2016-07-18 ENCOUNTER — Encounter (HOSPITAL_COMMUNITY): Payer: Self-pay | Admitting: Emergency Medicine

## 2016-07-18 DIAGNOSIS — J029 Acute pharyngitis, unspecified: Secondary | ICD-10-CM | POA: Insufficient documentation

## 2016-07-18 DIAGNOSIS — I10 Essential (primary) hypertension: Secondary | ICD-10-CM | POA: Insufficient documentation

## 2016-07-18 DIAGNOSIS — F17210 Nicotine dependence, cigarettes, uncomplicated: Secondary | ICD-10-CM | POA: Insufficient documentation

## 2016-07-18 DIAGNOSIS — Z79899 Other long term (current) drug therapy: Secondary | ICD-10-CM | POA: Insufficient documentation

## 2016-07-18 DIAGNOSIS — J45909 Unspecified asthma, uncomplicated: Secondary | ICD-10-CM | POA: Insufficient documentation

## 2016-07-18 DIAGNOSIS — N342 Other urethritis: Secondary | ICD-10-CM | POA: Insufficient documentation

## 2016-07-18 LAB — BASIC METABOLIC PANEL
Anion gap: 9 (ref 5–15)
BUN: 12 mg/dL (ref 6–20)
CHLORIDE: 104 mmol/L (ref 101–111)
CO2: 26 mmol/L (ref 22–32)
Calcium: 9.4 mg/dL (ref 8.9–10.3)
Creatinine, Ser: 1.38 mg/dL — ABNORMAL HIGH (ref 0.61–1.24)
GFR calc Af Amer: >60 mL/min (ref >60)
GFR calc non Af Amer: >60 mL/min (ref >60)
Glucose, Bld: 114 mg/dL — ABNORMAL HIGH (ref 65–99)
Potassium: 4.1 mmol/L (ref 3.5–5.1)
Sodium: 139 mmol/L (ref 135–145)

## 2016-07-18 LAB — CBC
HCT: 50.5 % (ref 39.0–52.0)
Hemoglobin: 17 g/dL (ref 13.0–17.0)
MCH: 30.7 pg (ref 26.0–34.0)
MCHC: 33.7 g/dL (ref 30.0–36.0)
MCV: 91.2 fL (ref 78.0–100.0)
PLATELETS: 257 10*3/uL (ref 150–400)
RBC: 5.54 MIL/uL (ref 4.22–5.81)
RDW: 16.3 % — AB (ref 11.5–15.5)
WBC: 15.1 10*3/uL — ABNORMAL HIGH (ref 4.0–10.5)

## 2016-07-18 LAB — RAPID STREP SCREEN (MED CTR MEBANE ONLY): STREPTOCOCCUS, GROUP A SCREEN (DIRECT): NEGATIVE

## 2016-07-18 MED ORDER — ACETAMINOPHEN 325 MG PO TABS
650.0000 mg | ORAL_TABLET | Freq: Once | ORAL | Status: AC | PRN
  Administered 2016-07-18: 650 mg via ORAL
  Filled 2016-07-18: qty 2

## 2016-07-18 NOTE — ED Triage Notes (Signed)
Pt c/o sore throat states he thinks he has strep throat. Difficulty swallowing onset this am. Pt reports swelling under his right arm. "I googled it and I think I have an STD or UTI." pt denies dysuria but has had some "leaking"

## 2016-07-19 ENCOUNTER — Emergency Department (HOSPITAL_COMMUNITY)
Admission: EM | Admit: 2016-07-19 | Discharge: 2016-07-19 | Disposition: A | Discharge status: Home / Self Care | Payer: Self-pay | Attending: Emergency Medicine | Admitting: Emergency Medicine

## 2016-07-19 DIAGNOSIS — R509 Fever, unspecified: Secondary | ICD-10-CM

## 2016-07-19 DIAGNOSIS — J029 Acute pharyngitis, unspecified: Secondary | ICD-10-CM

## 2016-07-19 DIAGNOSIS — N342 Other urethritis: Secondary | ICD-10-CM

## 2016-07-19 HISTORY — DX: Unspecified asthma, uncomplicated: J45.909

## 2016-07-19 LAB — URINALYSIS, ROUTINE W REFLEX MICROSCOPIC
Bilirubin Urine: NEGATIVE
GLUCOSE, UA: NEGATIVE mg/dL
HGB URINE DIPSTICK: NEGATIVE
Ketones, ur: NEGATIVE mg/dL
Leukocytes, UA: NEGATIVE
Nitrite: NEGATIVE
PROTEIN: NEGATIVE mg/dL
Specific Gravity, Urine: 1.003 — ABNORMAL LOW (ref 1.005–1.030)
pH: 6 (ref 5.0–8.0)

## 2016-07-19 LAB — RPR: RPR: NONREACTIVE

## 2016-07-19 LAB — HIV ANTIBODY (ROUTINE TESTING W REFLEX): HIV SCREEN 4TH GENERATION: NONREACTIVE

## 2016-07-19 MED ORDER — CEFTRIAXONE SODIUM 250 MG IJ SOLR
250.0000 mg | Freq: Once | INTRAMUSCULAR | Status: AC
  Administered 2016-07-19: 250 mg via INTRAMUSCULAR
  Filled 2016-07-19: qty 250

## 2016-07-19 MED ORDER — ACETAMINOPHEN 500 MG PO TABS
1000.0000 mg | ORAL_TABLET | Freq: Once | ORAL | Status: AC
  Administered 2016-07-19: 1000 mg via ORAL
  Filled 2016-07-19: qty 2

## 2016-07-19 MED ORDER — KETOROLAC TROMETHAMINE 30 MG/ML IJ SOLN
15.0000 mg | Freq: Once | INTRAMUSCULAR | Status: AC
  Administered 2016-07-19: 15 mg via INTRAVENOUS
  Filled 2016-07-19: qty 1

## 2016-07-19 MED ORDER — SODIUM CHLORIDE 0.9 % IV BOLUS (SEPSIS)
1000.0000 mL | Freq: Once | INTRAVENOUS | Status: AC
  Administered 2016-07-19: 1000 mL via INTRAVENOUS

## 2016-07-19 MED ORDER — AZITHROMYCIN 250 MG PO TABS
1000.0000 mg | ORAL_TABLET | Freq: Once | ORAL | Status: AC
  Administered 2016-07-19: 1000 mg via ORAL
  Filled 2016-07-19: qty 4

## 2016-07-19 MED ORDER — STERILE WATER FOR INJECTION IJ SOLN
INTRAMUSCULAR | Status: AC
  Administered 2016-07-19: 10 mL
  Filled 2016-07-19: qty 10

## 2016-07-19 NOTE — Discharge Instructions (Signed)
You can be discharged home and should treat your fever with Tylenol (1000 mg every 4 hours) and ibuprofen (600 mg every 8 hours). Push fluids. Return here with any persistent or worsening symptoms.

## 2016-07-19 NOTE — ED Provider Notes (Signed)
[facility]-EMERGENCY DEPT Provider Note   CSN: [id number] Arrival date & time: 07/18/16  2127     History   Chief Complaint Chief Complaint  Patient presents with  . Sore Throat  . Penile Discharge    HPI Tim [name] is a 41 y.o. male.  Patient presents with complaint of fever, sore throat, body aches and penile discharge for the past 3 days. No dysuria or flank pain. He reports abdominal discomfort across lower abdomen and extending to LUQ.  Sore throat started this morning without congestion or cough. No nausea or vomiting. No diarrhea.    The history is provided by the patient. No language interpreter was used.  Sore Throat  Associated symptoms include abdominal pain. Pertinent negatives include no chest pain and no shortness of breath.  Penile Discharge  Associated symptoms include abdominal pain. Pertinent negatives include no chest pain and no shortness of breath.    Past Medical History:  Diagnosis Date  . Asthma   . Hypertension     There are no active problems to display for this patient.   Past Surgical History:  Procedure Laterality Date  . WISDOM TOOTH EXTRACTION         Home Medications    Prior to Admission medications   Medication Sig Start Date End Date Taking? Authorizing Provider  amLODipine (NORVASC) 10 MG tablet Take 1 tablet (10 mg total) by mouth daily. Patient not taking: Reported on 02/02/2016 06/19/14   Mathis Fare Presson, PA  amoxicillin-clavulanate (AUGMENTIN) 875-125 MG tablet Take 1 tablet by mouth 2 (two) times daily. One po bid x 7 days 02/29/16   Kellogg, PA-C  HYDROcodone-acetaminophen (NORCO) 5-325 MG per tablet Take 1 tablet by mouth every 6 (six) hours as needed for moderate pain. Patient not taking: Reported on 02/02/2016 02/05/14   Charm Rings, MD  HYDROcodone-acetaminophen [facility]) 5-325 MG tablet Take 1 tablet by mouth every 6 (six) hours as needed for severe pain. 02/29/16   Mercedes Street, PA-C  meloxicam  (MOBIC) 15 MG tablet Take 0.5 tablets (7.5 mg total) by mouth daily. Patient not taking: Reported on 02/02/2016 02/05/14   Charm Rings, MD  naproxen (NAPROSYN) 500 MG tablet Take 1 tablet (500 mg total) by mouth 2 (two) times daily as needed for mild pain, moderate pain or headache (TAKE WITH MEALS.). 02/29/16   Mercedes Street, PA-C    Family History Family History  Problem Relation Age of Onset  . Hypertension Mother     Social History Social History  Substance Use Topics  . Smoking status: Current Every Day Smoker    Packs/day: 1.00    Types: Cigarettes  . Smokeless tobacco: Never Used  . Alcohol use Yes     Comment: occassionally     Allergies   Patient has no known allergies.   Review of Systems Review of Systems  Constitutional: Positive for chills and fever.  HENT: Positive for sore throat. Negative for congestion.   Eyes: Negative for discharge.  Respiratory: Negative.  Negative for cough and shortness of breath.   Cardiovascular: Negative.  Negative for chest pain.  Gastrointestinal: Positive for abdominal pain. Negative for nausea and vomiting.  Genitourinary: Positive for discharge. Negative for dysuria, scrotal swelling and testicular pain.  Musculoskeletal: Positive for myalgias. Negative for arthralgias.  Skin: Negative.  Negative for rash.  Neurological: Negative.  Negative for weakness.     Physical Exam Updated Vital Signs BP (!) 159/117 (BP Location: Right Arm)   Pulse  97   Temp (!) 101.4 F (38.6 C) (Oral)   Resp 18   SpO2 95%   Physical Exam  Constitutional: He is oriented to person, place, and time. He appears well-developed and well-nourished.  HENT:  Head: Normocephalic.  Mouth/Throat: Uvula is midline. No oropharyngeal exudate.  Eyes: Conjunctivae are normal.  Neck: Normal range of motion. Neck supple.  Cardiovascular: Normal rate and regular rhythm.   No murmur heard. Pulmonary/Chest: Effort normal and breath sounds normal. He has  no wheezes. He has no rales.  Abdominal: Soft. Bowel sounds are normal. There is tenderness (Minimal abdominal tenderness to palpation). There is no rebound and no guarding.  Genitourinary: Penis normal.  Genitourinary Comments: Circumcised penis. No discharge present. No testicular tenderness or scrotal swelling.   Musculoskeletal: Normal range of motion.  Lymphadenopathy:    He has no cervical adenopathy.       Right: No inguinal adenopathy present.       Left: No inguinal adenopathy present.  Neurological: He is alert and oriented to person, place, and time.  Skin: Skin is warm and dry. No rash noted.  Psychiatric: He has a normal mood and affect.     ED Treatments / Results  Labs (all labs ordered are listed, but only abnormal results are displayed) Labs Reviewed  URINALYSIS, ROUTINE W REFLEX MICROSCOPIC - Abnormal; Notable for the following:       Result Value   Color, Urine STRAW (*)    Specific Gravity, Urine 1.003 (*)    All other components within normal limits  BASIC METABOLIC PANEL - Abnormal; Notable for the following:    Glucose, Bld 114 (*)    Creatinine, Ser 1.38 (*)    All other components within normal limits  CBC - Abnormal; Notable for the following:    WBC 15.1 (*)    RDW 16.3 (*)    All other components within normal limits  RAPID STREP SCREEN (NOT AT [facility])  URINE CULTURE  CULTURE, GROUP A STREP ([facility])  RPR  HIV ANTIBODY (ROUTINE TESTING)  GC/CHLAMYDIA PROBE AMP ([place]) NOT AT [facility]    EKG  EKG Interpretation None       Radiology No results found.  Procedures Procedures (including critical care time)  Medications Ordered in ED Medications  sodium chloride 0.9 % bolus 1,000 mL (not administered)  ketorolac (TORADOL) 30 MG/ML injection 15 mg (not administered)  acetaminophen (TYLENOL) tablet 650 mg (650 mg Oral Given 07/18/16 2237)     Initial Impression / Assessment and Plan / ED Course  I have reviewed the triage vital signs and  the nursing notes.  Pertinent labs & imaging results that were available during my care of the patient were reviewed by me and considered in my medical decision making (see chart for details).     Patient seen and evaluated for sore throat, fever and penile discharge. Strep is negative. No purulent oropharyngeal exudates to suggest GC infection. His body aches are muscular and not specifically joint. He will be treated for STD given penile discharge and instructed on fever and sore throat care. REturn precautions discussed.   Final Clinical Impressions(s) / ED Diagnoses   Final diagnoses:  None   1. Fever 2. Urethritis 3. Pharyngitis  New Prescriptions New Prescriptions   No medications on file     Elpidio Anis, PA-C 07/27/16 0804    Layla Maw Ward, DO 07/29/16 2317

## 2016-07-20 LAB — GC/CHLAMYDIA PROBE AMP (~~LOC~~) NOT AT ARMC
Chlamydia: NEGATIVE
Neisseria Gonorrhea: NEGATIVE

## 2016-07-20 LAB — CULTURE, GROUP A STREP (THRC)

## 2016-07-20 LAB — URINE CULTURE: CULTURE: NO GROWTH

## 2016-11-30 ENCOUNTER — Emergency Department (HOSPITAL_COMMUNITY)
Admission: EM | Admit: 2016-11-30 | Discharge: 2016-11-30 | Disposition: A | Discharge status: Home / Self Care | Payer: Self-pay | Attending: Emergency Medicine | Admitting: Emergency Medicine

## 2016-11-30 ENCOUNTER — Encounter (HOSPITAL_COMMUNITY): Payer: Self-pay | Admitting: Emergency Medicine

## 2016-11-30 DIAGNOSIS — I10 Essential (primary) hypertension: Secondary | ICD-10-CM | POA: Insufficient documentation

## 2016-11-30 DIAGNOSIS — J45909 Unspecified asthma, uncomplicated: Secondary | ICD-10-CM | POA: Insufficient documentation

## 2016-11-30 DIAGNOSIS — K0889 Other specified disorders of teeth and supporting structures: Secondary | ICD-10-CM | POA: Insufficient documentation

## 2016-11-30 DIAGNOSIS — F17210 Nicotine dependence, cigarettes, uncomplicated: Secondary | ICD-10-CM | POA: Insufficient documentation

## 2016-11-30 DIAGNOSIS — R369 Urethral discharge, unspecified: Secondary | ICD-10-CM | POA: Insufficient documentation

## 2016-11-30 MED ORDER — KETOROLAC TROMETHAMINE 30 MG/ML IJ SOLN
30.0000 mg | Freq: Once | INTRAMUSCULAR | Status: AC
  Administered 2016-11-30: 30 mg via INTRAMUSCULAR
  Filled 2016-11-30: qty 1

## 2016-11-30 MED ORDER — CEFTRIAXONE SODIUM 250 MG IJ SOLR
250.0000 mg | Freq: Once | INTRAMUSCULAR | Status: AC
  Administered 2016-11-30: 250 mg via INTRAMUSCULAR
  Filled 2016-11-30: qty 250

## 2016-11-30 MED ORDER — AZITHROMYCIN 250 MG PO TABS
1000.0000 mg | ORAL_TABLET | Freq: Once | ORAL | Status: AC
  Administered 2016-11-30: 1000 mg via ORAL
  Filled 2016-11-30: qty 4

## 2016-11-30 MED ORDER — LIDOCAINE HCL (PF) 1 % IJ SOLN
INTRAMUSCULAR | Status: AC
  Administered 2016-11-30: 0.9 mL
  Filled 2016-11-30: qty 5

## 2016-11-30 MED ORDER — OXYCODONE-ACETAMINOPHEN 5-325 MG PO TABS
1.0000 | ORAL_TABLET | Freq: Once | ORAL | Status: AC
  Administered 2016-11-30: 1 via ORAL
  Filled 2016-11-30: qty 1

## 2016-11-30 MED ORDER — AMLODIPINE BESYLATE 10 MG PO TABS: 10.0000 mg | ORAL_TABLET | Freq: Every day | ORAL | 0 refills | Status: DC

## 2016-11-30 NOTE — ED Notes (Signed)
EDP aware of patient's blood pressure  

## 2016-11-30 NOTE — ED Provider Notes (Signed)
[facility]-EMERGENCY DEPT Provider Note   CSN: [id number] Arrival date & time: 11/30/16  1138     History   Chief Complaint Chief Complaint  Patient presents with  . Dental Pain  . Penile Discharge    HPI Tim [name] is a 41 y.o. male.  HPI   41 year old male presents today with complaints of dental pain. Patient reports right lower dental pain. He notes she's had problems in the past with an impacted tooth, but has not had removed. Patient notes symptoms worsened today, home medications not improving symptoms. Patient denies any swelling, fever, nausea or vomiting, or any other signs of infection.  Patient also noting 3 weeks of penile discharge, he denies testicular pain, abdominal pain.   Past Medical History:  Diagnosis Date  . Asthma   . Hypertension     There are no active problems to display for this patient.   Past Surgical History:  Procedure Laterality Date  . WISDOM TOOTH EXTRACTION         Home Medications    Prior to Admission medications   Medication Sig Start Date End Date Taking? Authorizing Provider  amLODipine (NORVASC) 10 MG tablet Take 1 tablet (10 mg total) by mouth daily. 11/30/16   Ephriam Turman, Tinnie Gens, PA-C  amoxicillin-clavulanate (AUGMENTIN) 875-125 MG tablet Take 1 tablet by mouth 2 (two) times daily. One po bid x 7 days Patient not taking: Reported on 07/19/2016 02/29/16   Street, [place], PA-C  HYDROcodone-acetaminophen (NORCO) 5-325 MG per tablet Take 1 tablet by mouth every 6 (six) hours as needed for moderate pain. Patient not taking: Reported on 02/02/2016 02/05/14   Charm Rings, MD  HYDROcodone-acetaminophen [facility]) 5-325 MG tablet Take 1 tablet by mouth every 6 (six) hours as needed for severe pain. Patient not taking: Reported on 07/19/2016 02/29/16   Street, [place], PA-C  meloxicam (MOBIC) 15 MG tablet Take 0.5 tablets (7.5 mg total) by mouth daily. Patient not taking: Reported on 02/02/2016 02/05/14   Charm Rings, MD    naproxen (NAPROSYN) 500 MG tablet Take 1 tablet (500 mg total) by mouth 2 (two) times daily as needed for mild pain, moderate pain or headache (TAKE WITH MEALS.). Patient not taking: Reported on 07/19/2016 02/29/16   Street, [place], PA-C    Family History Family History  Problem Relation Age of Onset  . Hypertension Mother     Social History Social History  Substance Use Topics  . Smoking status: Current Every Day Smoker    Packs/day: 1.00    Types: Cigarettes  . Smokeless tobacco: Never Used  . Alcohol use No     Comment: occassionally     Allergies   Patient has no known allergies.   Review of Systems Review of Systems  All other systems reviewed and are negative.    Physical Exam Updated Vital Signs BP (!) 170/124 (BP Location: Right Arm)   Pulse 77   Temp 98.9 F (37.2 C) (Oral)   Resp 16   Ht 6\' 2"  (1.88 m)   Wt 113.4 kg (250 lb)   SpO2 98%   BMI 32.10 kg/m   Physical Exam  Constitutional: He is oriented to person, place, and time. He appears well-developed and well-nourished.  HENT:  Head: Normocephalic and atraumatic.  Third molar impacted right lower nose surrounding tenderness, no surrounding fluctuance gumline palpated with no abnormalities full active range of motion of the jaw  Eyes: Pupils are equal, round, and reactive to light. Conjunctivae are normal. Right eye  exhibits no discharge. Left eye exhibits no discharge. No scleral icterus.  Neck: Normal range of motion. No JVD present. No tracheal deviation present.  Pulmonary/Chest: Effort normal. No stridor.  Neurological: He is alert and oriented to person, place, and time. Coordination normal.  Psychiatric: He has a normal mood and affect. His behavior is normal. Judgment and thought content normal.  Nursing note and vitals reviewed.    ED Treatments / Results  Labs (all labs ordered are listed, but only abnormal results are displayed) Labs Reviewed  GC/CHLAMYDIA PROBE AMP ([place])  NOT AT [facility]    EKG  EKG Interpretation None       Radiology No results found.  Procedures Procedures (including critical care time)  Medications Ordered in ED Medications  ketorolac (TORADOL) 30 MG/ML injection 30 mg (30 mg Intramuscular Given 11/30/16 1235)  oxyCODONE-acetaminophen (PERCOCET/ROXICET) 5-325 MG per tablet 1 tablet (1 tablet Oral Given 11/30/16 1234)  cefTRIAXone (ROCEPHIN) injection 250 mg (250 mg Intramuscular Given 11/30/16 1245)  azithromycin (ZITHROMAX) tablet 1,000 mg (1,000 mg Oral Given 11/30/16 1243)  lidocaine (PF) (XYLOCAINE) 1 % injection (0.9 mLs  Given 11/30/16 1245)  oxyCODONE-acetaminophen (PERCOCET/ROXICET) 5-325 MG per tablet 1 tablet (1 tablet Oral Given 11/30/16 1420)     Initial Impression / Assessment and Plan / ED Course  I have reviewed the triage vital signs and the nursing notes.  Pertinent labs & imaging results that were available during my care of the patient were reviewed by me and considered in my medical decision making (see chart for details).     41 year old male presents today with uncomplicated dental pain. He was treated here in the emergency room with improvement in symptoms. He will use over-the-counter medications at home and follow-up with dental resources. He has no signs of infection here today. Patient is also having penile discharge similar to previous STDs. He'll be treated prophylactically  patient given return precautions verbalized understanding and agreement to today's plan.  Final Clinical Impressions(s) / ED Diagnoses   Final diagnoses:  Penile discharge  Pain, dental  Hypertension, unspecified type    New Prescriptions Discharge Medication List as of 11/30/2016  2:06 PM       Eyvonne Mechanic, PA-C 11/30/16 1721    Geoffery Lyons, MD 12/01/16 [phone]

## 2016-11-30 NOTE — Discharge Instructions (Signed)
Please read attached information. If you experience any new or worsening signs or symptoms please return to the emergency room for evaluation. Please follow-up with your primary care provider or specialist as discussed. Please use medication prescribed only as directed and discontinue taking if you have any concerning signs or symptoms.   °

## 2016-11-30 NOTE — ED Triage Notes (Signed)
Patient complaining of dental pain x 2 weeks. Patient reports needing wisdom tooth extraction but never following up. Patient also reports clear penile discharge when he becomes aroused. Denies dysuria.Patient notably hypertensive. Reports a history of hypertension but hasn't followed up for medication.

## 2016-11-30 NOTE — ED Notes (Signed)
EDP at bedside  

## 2016-12-01 LAB — GC/CHLAMYDIA PROBE AMP (~~LOC~~) NOT AT ARMC
Chlamydia: NEGATIVE
Neisseria Gonorrhea: NEGATIVE

## 2017-03-28 ENCOUNTER — Ambulatory Visit (HOSPITAL_COMMUNITY)
Admission: EM | Admit: 2017-03-28 | Discharge: 2017-03-28 | Disposition: A | Discharge status: Home / Self Care | Payer: Self-pay | Attending: Family Medicine | Admitting: Family Medicine

## 2017-03-28 ENCOUNTER — Encounter (HOSPITAL_COMMUNITY): Payer: Self-pay | Admitting: Emergency Medicine

## 2017-03-28 DIAGNOSIS — R369 Urethral discharge, unspecified: Secondary | ICD-10-CM

## 2017-03-28 DIAGNOSIS — Z202 Contact with and (suspected) exposure to infections with a predominantly sexual mode of transmission: Secondary | ICD-10-CM | POA: Insufficient documentation

## 2017-03-28 DIAGNOSIS — I10 Essential (primary) hypertension: Secondary | ICD-10-CM

## 2017-03-28 DIAGNOSIS — F17210 Nicotine dependence, cigarettes, uncomplicated: Secondary | ICD-10-CM | POA: Insufficient documentation

## 2017-03-28 DIAGNOSIS — M79641 Pain in right hand: Secondary | ICD-10-CM

## 2017-03-28 MED ORDER — AZITHROMYCIN 250 MG PO TABS
1000.0000 mg | ORAL_TABLET | Freq: Once | ORAL | Status: AC
  Administered 2017-03-28: 1000 mg via ORAL

## 2017-03-28 MED ORDER — CEFTRIAXONE SODIUM 250 MG IJ SOLR
250.0000 mg | Freq: Once | INTRAMUSCULAR | Status: AC
  Administered 2017-03-28: 250 mg via INTRAMUSCULAR

## 2017-03-28 MED ORDER — AMLODIPINE BESYLATE 10 MG PO TABS: 10.0000 mg | ORAL_TABLET | Freq: Every day | ORAL | 3 refills | Status: DC

## 2017-03-28 MED ORDER — LIDOCAINE HCL (PF) 1 % IJ SOLN
INTRAMUSCULAR | Status: AC
  Filled 2017-03-28: qty 2

## 2017-03-28 MED ORDER — CEFTRIAXONE SODIUM 250 MG IJ SOLR
INTRAMUSCULAR | Status: AC
  Filled 2017-03-28: qty 250

## 2017-03-28 MED ORDER — AZITHROMYCIN 250 MG PO TABS
ORAL_TABLET | ORAL | Status: AC
  Filled 2017-03-28: qty 4

## 2017-03-28 NOTE — ED Provider Notes (Signed)
[facility]    CSN: [id number] Arrival date & time: 03/28/17  1405     History   Chief Complaint Chief Complaint  Patient presents with  . Exposure to STD  . Hand Pain  . Hypertension    HPI Tim [name] is a 41 y.o. male.   The history is provided by the patient. No language interpreter was used.  Exposure to STD  This is a new problem. The current episode started 2 days ago. The problem occurs constantly. The problem has been gradually worsening. Pertinent negatives include no headaches and no shortness of breath. Nothing aggravates the symptoms. Nothing relieves the symptoms. He has tried nothing for the symptoms.  Hand Pain  Pertinent negatives include no headaches and no shortness of breath.  Hypertension  This is a recurrent problem. The problem occurs constantly. The problem has been gradually worsening. Pertinent negatives include no headaches and no shortness of breath. Nothing aggravates the symptoms. Nothing relieves the symptoms. He has tried nothing for the symptoms. The treatment provided no relief.  Pt complains of being out of his blood pressure medications.  Pt also has a penile discharge.  He admits to possible std exposure. Pt also has chronic hand pain  Past Medical History:  Diagnosis Date  . Asthma   . Hypertension     There are no active problems to display for this patient.   Past Surgical History:  Procedure Laterality Date  . WISDOM TOOTH EXTRACTION         Home Medications    Prior to Admission medications   Medication Sig Start Date End Date Taking? Authorizing Provider  amLODipine (NORVASC) 10 MG tablet Take 1 tablet (10 mg total) by mouth daily. 11/30/16   Hedges, Tinnie GensJeffrey, PA-C  amoxicillin-clavulanate (AUGMENTIN) 875-125 MG tablet Take 1 tablet by mouth 2 (two) times daily. One po bid x 7 days Patient not taking: Reported on 07/19/2016 02/29/16   Street, [place], PA-C  HYDROcodone-acetaminophen (NORCO) 5-325 MG per  tablet Take 1 tablet by mouth every 6 (six) hours as needed for moderate pain. Patient not taking: Reported on 02/02/2016 02/05/14   Charm RingsHonig, Erin J, MD  HYDROcodone-acetaminophen [facility](NORCO) 5-325 MG tablet Take 1 tablet by mouth every 6 (six) hours as needed for severe pain. Patient not taking: Reported on 07/19/2016 02/29/16   Street, [place], PA-C  meloxicam (MOBIC) 15 MG tablet Take 0.5 tablets (7.5 mg total) by mouth daily. Patient not taking: Reported on 02/02/2016 02/05/14   Charm RingsHonig, Erin J, MD  naproxen (NAPROSYN) 500 MG tablet Take 1 tablet (500 mg total) by mouth 2 (two) times daily as needed for mild pain, moderate pain or headache (TAKE WITH MEALS.). Patient not taking: Reported on 07/19/2016 02/29/16   Street, [place], PA-C    Family History Family History  Problem Relation Age of Onset  . Hypertension Mother     Social History Social History   Tobacco Use  . Smoking status: Current Every Day Smoker    Packs/day: 1.00    Types: Cigarettes  . Smokeless tobacco: Never Used  Substance Use Topics  . Alcohol use: No    Comment: occassionally  . Drug use: No     Allergies   Patient has no known allergies.   Review of Systems Review of Systems  Respiratory: Negative for shortness of breath.   Neurological: Negative for headaches.  All other systems reviewed and are negative.    Physical Exam Triage Vital Signs ED Triage Vitals  Enc  Vitals Group     BP 03/28/17 1440 (!) 167/107     Pulse Rate 03/28/17 1440 73     Resp 03/28/17 1440 18     Temp 03/28/17 1440 98.1 F (36.7 C)     Temp Source 03/28/17 1440 Oral     SpO2 03/28/17 1440 97 %     Weight --      Height --      Head Circumference --      Peak Flow --      Pain Score 03/28/17 1441 7     Pain Loc --      Pain Edu? --      Excl. in GC? --    No data found.  Updated Vital Signs BP (!) 167/107 (BP Location: Right Arm)   Pulse 73   Temp 98.1 F (36.7 C) (Oral)   Resp 18   SpO2 97%   Visual  Acuity Right Eye Distance:   Left Eye Distance:   Bilateral Distance:    Right Eye Near:   Left Eye Near:    Bilateral Near:     Physical Exam  Constitutional: He appears well-developed and well-nourished.  HENT:  Head: Normocephalic and atraumatic.  Eyes: Conjunctivae are normal.  Neck: Neck supple.  Cardiovascular: Normal rate and regular rhythm.  No murmur heard. Pulmonary/Chest: Effort normal and breath sounds normal. No respiratory distress.  Abdominal: Soft. There is no tenderness.  Musculoskeletal: He exhibits no edema.  Neurological: He is alert.  Skin: Skin is warm and dry.  Psychiatric: He has a normal mood and affect.  Nursing note and vitals reviewed.    UC Treatments / Results  Labs (all labs ordered are listed, but only abnormal results are displayed) Labs Reviewed  URINE CYTOLOGY ANCILLARY ONLY    EKG  EKG Interpretation None       Radiology No results found.  Procedures Procedures (including critical care time)  Medications Ordered in UC Medications - No data to display   Initial Impression / Assessment and Plan / UC Course  I have reviewed the triage vital signs and the nursing notes.  Pertinent labs & imaging results that were available during my care of the patient were reviewed by me and considered in my medical decision making (see chart for details).       Final Clinical Impressions(s) / UC Diagnoses   Final diagnoses:  Penile discharge  Hypertension, unspecified type    ED Discharge Orders    None       Controlled Substance Prescriptions [place] Controlled Substance Registry consulted? Not Applicable  An After Visit Summary was printed and given to the patient. An After Visit Summary was printed and given to the patient. Meds ordered this encounter  Medications  . cefTRIAXone (ROCEPHIN) injection 250 mg    Order Specific Question:   Antibiotic Indication:    Answer:   STD  . azithromycin (ZITHROMAX) tablet 1,000 mg    . amLODipine (NORVASC) 10 MG tablet    Sig: Take 1 tablet (10 mg total) by mouth daily.    Dispense:  30 tablet    Refill:  3    Order Specific Question:   Supervising Provider    Answer:   Eustace MooreMURRAY, LAURA W [[id number]]     Elson AreasSofia, Aleta Manternach K, PA-C 03/28/17 1517

## 2017-03-28 NOTE — ED Triage Notes (Signed)
Pt sts out of htn meds, having right hand pain after having dog bite 3 months ago; pt also sts penile discharge

## 2017-03-29 LAB — URINE CYTOLOGY ANCILLARY ONLY
Chlamydia: NEGATIVE
NEISSERIA GONORRHEA: NEGATIVE

## 2017-08-06 ENCOUNTER — Other Ambulatory Visit: Payer: Self-pay

## 2017-08-06 ENCOUNTER — Encounter (HOSPITAL_COMMUNITY): Payer: Self-pay | Admitting: Emergency Medicine

## 2017-08-06 ENCOUNTER — Emergency Department (HOSPITAL_COMMUNITY): Payer: Self-pay

## 2017-08-06 ENCOUNTER — Emergency Department (HOSPITAL_COMMUNITY)
Admission: EM | Admit: 2017-08-06 | Discharge: 2017-08-07 | Disposition: A | Discharge status: Home / Self Care | Payer: Self-pay | Attending: Emergency Medicine | Admitting: Emergency Medicine

## 2017-08-06 DIAGNOSIS — I10 Essential (primary) hypertension: Secondary | ICD-10-CM | POA: Insufficient documentation

## 2017-08-06 DIAGNOSIS — S61512A Laceration without foreign body of left wrist, initial encounter: Secondary | ICD-10-CM | POA: Insufficient documentation

## 2017-08-06 DIAGNOSIS — S62347A Nondisplaced fracture of base of fifth metacarpal bone. left hand, initial encounter for closed fracture: Secondary | ICD-10-CM | POA: Insufficient documentation

## 2017-08-06 DIAGNOSIS — Y929 Unspecified place or not applicable: Secondary | ICD-10-CM | POA: Insufficient documentation

## 2017-08-06 DIAGNOSIS — Z79899 Other long term (current) drug therapy: Secondary | ICD-10-CM | POA: Insufficient documentation

## 2017-08-06 DIAGNOSIS — J45909 Unspecified asthma, uncomplicated: Secondary | ICD-10-CM | POA: Insufficient documentation

## 2017-08-06 DIAGNOSIS — Y999 Unspecified external cause status: Secondary | ICD-10-CM | POA: Insufficient documentation

## 2017-08-06 DIAGNOSIS — S62307A Unspecified fracture of fifth metacarpal bone, left hand, initial encounter for closed fracture: Secondary | ICD-10-CM

## 2017-08-06 DIAGNOSIS — F17210 Nicotine dependence, cigarettes, uncomplicated: Secondary | ICD-10-CM | POA: Insufficient documentation

## 2017-08-06 DIAGNOSIS — Y939 Activity, unspecified: Secondary | ICD-10-CM | POA: Insufficient documentation

## 2017-08-06 MED ORDER — LIDOCAINE-EPINEPHRINE (PF) 2 %-1:200000 IJ SOLN
30.0000 mL | Freq: Once | INTRAMUSCULAR | Status: AC
  Administered 2017-08-06: 30 mL via INTRADERMAL
  Filled 2017-08-06: qty 40

## 2017-08-06 MED ORDER — ACETAMINOPHEN 500 MG PO TABS
1000.0000 mg | ORAL_TABLET | Freq: Once | ORAL | Status: AC
Start: 2017-08-06 — End: 2017-08-06
  Administered 2017-08-06: 1000 mg via ORAL
  Filled 2017-08-06: qty 2

## 2017-08-06 MED ORDER — IBUPROFEN 800 MG PO TABS
800.0000 mg | ORAL_TABLET | Freq: Once | ORAL | Status: AC
  Administered 2017-08-06: 800 mg via ORAL
  Filled 2017-08-06: qty 1
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
## 2017-08-07 MED ORDER — AMLODIPINE BESYLATE 10 MG PO TABS: 10.0000 mg | ORAL_TABLET | Freq: Every day | ORAL | 0 refills | Status: DC

## 2017-08-07 MED ORDER — DICLOFENAC SODIUM ER 100 MG PO TB24: 100.0000 mg | ORAL_TABLET | Freq: Every day | ORAL | 0 refills | Status: DC

## 2017-08-07 MED ORDER — BACITRACIN ZINC 500 UNIT/GM EX OINT
TOPICAL_OINTMENT | CUTANEOUS | Status: AC
  Administered 2017-08-07: 2
  Filled 2017-08-07: qty 1.8

## 2017-08-07 NOTE — ED Provider Notes (Signed)
LACERATION REPAIR Performed by: Demetrios Loll Authorized by: Demetrios Loll Consent: Verbal consent obtained. Risks and benefits: risks, benefits and alternatives were discussed Consent given by: patient Patient identity confirmed: provided demographic data Prepped and Draped in normal sterile fashion Wound explored  Laceration Location: Left wrist  Laceration Length: 6.5 cm  No Foreign Bodies seen or palpated  Anesthesia: local infiltration  Local anesthetic: lidocaine 1 % with epinephrine  Anesthetic total: 10 ml  Irrigation method: syringe Amount of cleaning: standard  Skin closure: Complex.  Required 2 figure-of-eight internal sutures for hemostasis.  Injection of lidocaine to help with hemostasis.  Number of sutures: Two 4-0 Vicryl Rapide figure-of-eight and internal.  Seven 4-0 Prolene horizontal mattress.  Technique: Figure-of-eight and horizontal mattress  Patient tolerance: Patient tolerated the procedure well with no immediate complications.    Wallace Keller 08/07/17 2158    Palumbo, April, MD 08/08/17 0104

## 2017-08-31 ENCOUNTER — Encounter (HOSPITAL_COMMUNITY): Payer: Self-pay | Admitting: Emergency Medicine

## 2017-08-31 ENCOUNTER — Ambulatory Visit (INDEPENDENT_AMBULATORY_CARE_PROVIDER_SITE_OTHER): Payer: Self-pay

## 2017-08-31 ENCOUNTER — Ambulatory Visit (HOSPITAL_COMMUNITY)
Admission: EM | Admit: 2017-08-31 | Discharge: 2017-08-31 | Disposition: A | Discharge status: Home / Self Care | Payer: Self-pay | Attending: Family Medicine | Admitting: Family Medicine

## 2017-08-31 DIAGNOSIS — I10 Essential (primary) hypertension: Secondary | ICD-10-CM

## 2017-08-31 DIAGNOSIS — F17210 Nicotine dependence, cigarettes, uncomplicated: Secondary | ICD-10-CM | POA: Insufficient documentation

## 2017-08-31 DIAGNOSIS — Z791 Long term (current) use of non-steroidal anti-inflammatories (NSAID): Secondary | ICD-10-CM | POA: Insufficient documentation

## 2017-08-31 DIAGNOSIS — Z760 Encounter for issue of repeat prescription: Secondary | ICD-10-CM

## 2017-08-31 DIAGNOSIS — R369 Urethral discharge, unspecified: Secondary | ICD-10-CM

## 2017-08-31 DIAGNOSIS — Z8249 Family history of ischemic heart disease and other diseases of the circulatory system: Secondary | ICD-10-CM | POA: Insufficient documentation

## 2017-08-31 DIAGNOSIS — Z113 Encounter for screening for infections with a predominantly sexual mode of transmission: Secondary | ICD-10-CM

## 2017-08-31 DIAGNOSIS — Z79899 Other long term (current) drug therapy: Secondary | ICD-10-CM | POA: Insufficient documentation

## 2017-08-31 DIAGNOSIS — X58XXXA Exposure to other specified factors, initial encounter: Secondary | ICD-10-CM | POA: Insufficient documentation

## 2017-08-31 DIAGNOSIS — M25532 Pain in left wrist: Secondary | ICD-10-CM

## 2017-08-31 DIAGNOSIS — S62347A Nondisplaced fracture of base of fifth metacarpal bone. left hand, initial encounter for closed fracture: Secondary | ICD-10-CM

## 2017-08-31 DIAGNOSIS — J45909 Unspecified asthma, uncomplicated: Secondary | ICD-10-CM | POA: Insufficient documentation

## 2017-08-31 MED ORDER — IBUPROFEN 800 MG PO TABS
ORAL_TABLET | ORAL | Status: AC
  Filled 2017-08-31: qty 1

## 2017-08-31 MED ORDER — HYDROCODONE-ACETAMINOPHEN 5-325 MG PO TABS: 1.0000 | ORAL_TABLET | Freq: Four times a day (QID) | ORAL | 0 refills | Status: DC | PRN

## 2017-08-31 MED ORDER — KETOROLAC TROMETHAMINE 60 MG/2ML IM SOLN: 60.0000 mg | Freq: Once | INTRAMUSCULAR | Status: DC

## 2017-08-31 MED ORDER — DEXAMETHASONE SODIUM PHOSPHATE 10 MG/ML IJ SOLN: 10.0000 mg | Freq: Once | INTRAMUSCULAR | Status: DC

## 2017-08-31 MED ORDER — IBUPROFEN 800 MG PO TABS: 800.0000 mg | ORAL_TABLET | Freq: Three times a day (TID) | ORAL | 0 refills | Status: DC

## 2017-08-31 MED ORDER — AZITHROMYCIN 250 MG PO TABS
1000.0000 mg | ORAL_TABLET | Freq: Once | ORAL | Status: AC
  Administered 2017-08-31: 1000 mg via ORAL

## 2017-08-31 MED ORDER — STERILE WATER FOR INJECTION IJ SOLN
INTRAMUSCULAR | Status: AC
  Filled 2017-08-31: qty 10

## 2017-08-31 MED ORDER — CEFTRIAXONE SODIUM 250 MG IJ SOLR
INTRAMUSCULAR | Status: AC
  Filled 2017-08-31: qty 250

## 2017-08-31 MED ORDER — AMLODIPINE BESYLATE 10 MG PO TABS: 10.0000 mg | ORAL_TABLET | Freq: Every day | ORAL | 2 refills | Status: DC

## 2017-08-31 MED ORDER — CEFTRIAXONE SODIUM 250 MG IJ SOLR
250.0000 mg | Freq: Once | INTRAMUSCULAR | Status: AC
  Administered 2017-08-31: 250 mg via INTRAMUSCULAR

## 2017-08-31 MED ORDER — AZITHROMYCIN 250 MG PO TABS
ORAL_TABLET | ORAL | Status: AC
  Filled 2017-08-31: qty 4

## 2017-08-31 MED ORDER — IBUPROFEN 800 MG PO TABS
800.0000 mg | ORAL_TABLET | Freq: Once | ORAL | Status: AC
  Administered 2017-08-31: 800 mg via ORAL

## 2017-08-31 NOTE — Discharge Instructions (Signed)
Hand Pain Fracture is not fully healed, please wear splint for the next 4 weeks.  Follow-up here or with orthopedics to ensure healing.  Please take anti-inflammatories-Tylenol and ibuprofen to help with swelling and pain.  Please use hydrocodone for severe pain sparingly Blood Pressure I have refilled your blood pressure medicine. Your blood pressure was elevated today in clinic. Please be sure to take blood pressure medications as prescribed. Please monitor your blood pressure at home or when you go to a [facility]. Please follow up with your primary care doctor to recheck blood pressure and discuss any need for medication changes.   Please go to Emergency Room if you start to experience severe headache, vision changes, decreased urine production, chest pain, shortness of breath, speech slurring, one sided weakness.
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
## 2017-08-31 NOTE — ED Notes (Signed)
Ortho tech contacted. 

## 2017-08-31 NOTE — ED Triage Notes (Signed)
Pt broke his wrist a month ago and supposed to follow up with ortho but cant afford it. Pt also c/o penile discharge. Pt also out of his BP meds.

## 2017-09-01 LAB — URINE CYTOLOGY ANCILLARY ONLY
CHLAMYDIA, DNA PROBE: NEGATIVE
NEISSERIA GONORRHEA: NEGATIVE
Trichomonas: NEGATIVE

## 2017-09-18 ENCOUNTER — Encounter (HOSPITAL_COMMUNITY): Payer: Self-pay

## 2017-09-18 ENCOUNTER — Ambulatory Visit (HOSPITAL_COMMUNITY)
Admission: EM | Admit: 2017-09-18 | Discharge: 2017-09-18 | Disposition: A | Discharge status: Home / Self Care | Payer: Self-pay | Attending: Family Medicine | Admitting: Family Medicine

## 2017-09-18 DIAGNOSIS — Z202 Contact with and (suspected) exposure to infections with a predominantly sexual mode of transmission: Secondary | ICD-10-CM | POA: Insufficient documentation

## 2017-09-18 DIAGNOSIS — M79643 Pain in unspecified hand: Secondary | ICD-10-CM | POA: Insufficient documentation

## 2017-09-18 DIAGNOSIS — Z79899 Other long term (current) drug therapy: Secondary | ICD-10-CM | POA: Insufficient documentation

## 2017-09-18 DIAGNOSIS — Z8249 Family history of ischemic heart disease and other diseases of the circulatory system: Secondary | ICD-10-CM | POA: Insufficient documentation

## 2017-09-18 DIAGNOSIS — F17210 Nicotine dependence, cigarettes, uncomplicated: Secondary | ICD-10-CM | POA: Insufficient documentation

## 2017-09-18 DIAGNOSIS — I10 Essential (primary) hypertension: Secondary | ICD-10-CM | POA: Insufficient documentation

## 2017-09-18 DIAGNOSIS — Z9889 Other specified postprocedural states: Secondary | ICD-10-CM | POA: Insufficient documentation

## 2017-09-18 DIAGNOSIS — M79642 Pain in left hand: Secondary | ICD-10-CM

## 2017-09-18 MED ORDER — CEFTRIAXONE SODIUM 250 MG IJ SOLR
INTRAMUSCULAR | Status: AC
  Filled 2017-09-18: qty 250

## 2017-09-18 MED ORDER — AZITHROMYCIN 250 MG PO TABS
ORAL_TABLET | ORAL | Status: AC
  Filled 2017-09-18: qty 4

## 2017-09-18 MED ORDER — AZITHROMYCIN 250 MG PO TABS
1000.0000 mg | ORAL_TABLET | Freq: Once | ORAL | Status: AC
  Administered 2017-09-18: 1000 mg via ORAL

## 2017-09-18 MED ORDER — CEFTRIAXONE SODIUM 250 MG IJ SOLR
250.0000 mg | Freq: Once | INTRAMUSCULAR | Status: AC
  Administered 2017-09-18: 250 mg via INTRAMUSCULAR
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
## 2017-09-18 NOTE — ED Provider Notes (Signed)
[facility]    CSN: [id number] Arrival date & time: 09/18/17  1152     History   Chief Complaint Chief Complaint  Patient presents with  . Exposure to STD  . Hand Pain    HPI Tim [name] is a 42 y.o. male.   HPI  Patient was seen recently for hand pain.  He had a fracture.  He will wear his brace.  Is not healing properly.  He is here today with no brace.  He states his hand still hurts. He was also treated for STD exposure.  He was given Rocephin and Zithromax.  His testing was negative for GC and chlamydia.  He wishes testing again.  He states his significant other was not tested, and she is claiming symptoms.  He feels well.  No discharge, dysuria, fever, or rash.  Past Medical History:  Diagnosis Date  . Asthma   . Hypertension     There are no active problems to display for this patient.   Past Surgical History:  Procedure Laterality Date  . WISDOM TOOTH EXTRACTION         Home Medications    Prior to Admission medications   Medication Sig Start Date End Date Taking? Authorizing Provider  amLODipine (NORVASC) 10 MG tablet Take 1 tablet (10 mg total) by mouth daily. 08/31/17 09/30/17 Yes Wieters, Hallie C, PA-C  ibuprofen (ADVIL,MOTRIN) 800 MG tablet Take 1 tablet (800 mg total) by mouth 3 (three) times daily. 08/31/17   Wieters, Junius CreamerHallie C, PA-C    Family History Family History  Problem Relation Age of Onset  . Hypertension Mother     Social History Social History   Tobacco Use  . Smoking status: Current Every Day Smoker    Packs/day: 1.00    Types: Cigarettes  . Smokeless tobacco: Never Used  Substance Use Topics  . Alcohol use: Yes    Comment: occassionally  . Drug use: No     Allergies   Patient has no known allergies.   Review of Systems Review of Systems  Constitutional: Negative for chills and fever.  HENT: Negative for ear pain and sore throat.   Eyes: Negative for pain and visual disturbance.  Respiratory:  Negative for cough and shortness of breath.   Cardiovascular: Negative for chest pain and palpitations.  Gastrointestinal: Negative for abdominal pain and vomiting.  Genitourinary: Negative for dysuria and hematuria.  Musculoskeletal: Negative for arthralgias and back pain.       Pain left hand  Skin: Negative for color change and rash.  Neurological: Negative for seizures and syncope.  All other systems reviewed and are negative.    Physical Exam Triage Vital Signs ED Triage Vitals [09/18/17 1247]  Enc Vitals Group     BP (!) 168/107     Pulse Rate 73     Resp 18     Temp 97.7 F (36.5 C)     Temp src      SpO2 100 %     Weight      Height      Head Circumference      Peak Flow      Pain Score 7     Pain Loc      Pain Edu?      Excl. in GC?    No data found.  Updated Vital Signs BP (!) 168/107   Pulse 73   Temp 97.7 F (36.5 C)   Resp 18   SpO2 100%  Visual Acuity Right Eye Distance:   Left Eye Distance:   Bilateral Distance:    Right Eye Near:   Left Eye Near:    Bilateral Near:     Physical Exam  Constitutional: He appears well-developed and well-nourished. No distress.  HENT:  Head: Normocephalic and atraumatic.  Mouth/Throat: Oropharynx is clear and moist.  Eyes: Pupils are equal, round, and reactive to light. Conjunctivae are normal.  Neck: Normal range of motion.  Cardiovascular: Normal rate, regular rhythm and normal heart sounds.  Pulmonary/Chest: Effort normal and breath sounds normal. No respiratory distress.  Abdominal: Soft. He exhibits no distension.  Musculoskeletal: Normal range of motion. He exhibits no edema.  Left hand has some minor swelling and deformity of the fifth metacarpal, volar.  Grip strength is good.  No rotation of the finger.  Neurological: He is alert.  Skin: Skin is warm and dry.     UC Treatments / Results  Labs (all labs ordered are listed, but only abnormal results are displayed) Labs Reviewed  URINE  CYTOLOGY ANCILLARY ONLY    EKG None  Radiology No results found.  Procedures Procedures (including critical care time)  Medications Ordered in UC Medications  cefTRIAXone (ROCEPHIN) injection 250 mg (250 mg Intramuscular Given 09/18/17 1345)  azithromycin (ZITHROMAX) tablet 1,000 mg (1,000 mg Oral Given 09/18/17 1345)    Initial Impression / Assessment and Plan / UC Course  I have reviewed the triage vital signs and the nursing notes.  Pertinent labs & imaging results that were available during my care of the patient were reviewed by me and considered in my medical decision making (see chart for details).     Explained that at this point is probably not indicated to put him in any other ulnar gutter splint.  He does not seem to wear them anyway.  I am going to put him in a wrist brace to discourage him with heavy use of his hand. Final Clinical Impressions(s) / UC Diagnoses   Final diagnoses:  Exposure to STD     Discharge Instructions     Wear brace as needed for comfort We did lab testing during this visit.  If there are any abnormal findings that require change in medicine or indicate a positive result, you will be notified.  If all of your tests are normal, you will not be called.      ED Prescriptions    None     Controlled Substance Prescriptions [place] Controlled Substance Registry consulted? Not Applicable   Eustace Moore, MD 09/18/17 1415

## 2017-09-18 NOTE — Discharge Instructions (Signed)
Wear brace as needed for comfort We did lab testing during this visit.  If there are any abnormal findings that require change in medicine or indicate a positive result, you will be notified.  If all of your tests are normal, you will not be called.

## 2017-09-19 LAB — URINE CYTOLOGY ANCILLARY ONLY
CHLAMYDIA, DNA PROBE: NEGATIVE
NEISSERIA GONORRHEA: NEGATIVE
Trichomonas: NEGATIVE

## 2017-09-22 ENCOUNTER — Observation Stay (HOSPITAL_COMMUNITY)
Admission: EM | Admit: 2017-09-22 | Discharge: 2017-09-23 | Disposition: A | Discharge status: Home / Self Care | Payer: Self-pay | Attending: Family Medicine | Admitting: Family Medicine

## 2017-09-22 ENCOUNTER — Encounter (HOSPITAL_COMMUNITY): Payer: Self-pay

## 2017-09-22 ENCOUNTER — Other Ambulatory Visit: Payer: Self-pay

## 2017-09-22 DIAGNOSIS — T405X1A Poisoning by cocaine, accidental (unintentional), initial encounter: Principal | ICD-10-CM | POA: Insufficient documentation

## 2017-09-22 DIAGNOSIS — T510X1A Toxic effect of ethanol, accidental (unintentional), initial encounter: Secondary | ICD-10-CM | POA: Insufficient documentation

## 2017-09-22 DIAGNOSIS — R55 Syncope and collapse: Secondary | ICD-10-CM | POA: Insufficient documentation

## 2017-09-22 DIAGNOSIS — T50904A Poisoning by unspecified drugs, medicaments and biological substances, undetermined, initial encounter: Secondary | ICD-10-CM

## 2017-09-22 DIAGNOSIS — R7989 Other specified abnormal findings of blood chemistry: Secondary | ICD-10-CM

## 2017-09-22 DIAGNOSIS — Y92009 Unspecified place in unspecified non-institutional (private) residence as the place of occurrence of the external cause: Secondary | ICD-10-CM | POA: Insufficient documentation

## 2017-09-22 DIAGNOSIS — Z8249 Family history of ischemic heart disease and other diseases of the circulatory system: Secondary | ICD-10-CM | POA: Insufficient documentation

## 2017-09-22 DIAGNOSIS — I10 Essential (primary) hypertension: Secondary | ICD-10-CM | POA: Insufficient documentation

## 2017-09-22 DIAGNOSIS — F1410 Cocaine abuse, uncomplicated: Secondary | ICD-10-CM | POA: Insufficient documentation

## 2017-09-22 DIAGNOSIS — T50901A Poisoning by unspecified drugs, medicaments and biological substances, accidental (unintentional), initial encounter: Secondary | ICD-10-CM | POA: Diagnosis present

## 2017-09-22 DIAGNOSIS — I248 Other forms of acute ischemic heart disease: Secondary | ICD-10-CM | POA: Insufficient documentation

## 2017-09-22 DIAGNOSIS — R778 Other specified abnormalities of plasma proteins: Secondary | ICD-10-CM

## 2017-09-22 DIAGNOSIS — F17210 Nicotine dependence, cigarettes, uncomplicated: Secondary | ICD-10-CM | POA: Insufficient documentation

## 2017-09-22 DIAGNOSIS — I472 Ventricular tachycardia: Secondary | ICD-10-CM | POA: Insufficient documentation

## 2017-09-22 HISTORY — DX: Patient's noncompliance with other medical treatment and regimen: Z91.19

## 2017-09-22 HISTORY — DX: Other psychoactive substance abuse, uncomplicated: F19.10

## 2017-09-22 HISTORY — DX: Patient's noncompliance with other medical treatment and regimen due to unspecified reason: Z91.199

## 2017-09-22 LAB — COMPREHENSIVE METABOLIC PANEL
ALT: 28 U/L (ref 17–63)
AST: 30 U/L (ref 15–41)
Albumin: 4.6 g/dL (ref 3.5–5.0)
Alkaline Phosphatase: 71 U/L (ref 38–126)
Anion gap: 9 (ref 5–15)
BILIRUBIN TOTAL: 0.5 mg/dL (ref 0.3–1.2)
BUN: 11 mg/dL (ref 6–20)
CO2: 27 mmol/L (ref 22–32)
Calcium: 8.9 mg/dL (ref 8.9–10.3)
Chloride: 106 mmol/L (ref 101–111)
Creatinine, Ser: 1.09 mg/dL (ref 0.61–1.24)
GFR calc Af Amer: >60 mL/min (ref >60)
Glucose, Bld: 111 mg/dL — ABNORMAL HIGH (ref 65–99)
POTASSIUM: 3.5 mmol/L (ref 3.5–5.1)
Sodium: 142 mmol/L (ref 135–145)
TOTAL PROTEIN: 7.5 g/dL (ref 6.5–8.1)

## 2017-09-22 LAB — TROPONIN I
TROPONIN I: 0.05 ng/mL — AB (ref <0.03)
TROPONIN I: 0.06 ng/mL — AB (ref <0.03)
TROPONIN I: 0.04 ng/mL — AB (ref <0.03)
Troponin I: 0.08 ng/mL (ref <0.03)

## 2017-09-22 LAB — CBC WITH DIFFERENTIAL/PLATELET
Basophils Absolute: 0 10*3/uL (ref 0.0–0.1)
Basophils Relative: 0 %
Eosinophils Absolute: 0.1 10*3/uL (ref 0.0–0.7)
Eosinophils Relative: 2 %
HEMATOCRIT: 50.1 % (ref 39.0–52.0)
Hemoglobin: 17.1 g/dL — ABNORMAL HIGH (ref 13.0–17.0)
Lymphocytes Relative: 16 %
Lymphs Abs: 1.2 10*3/uL (ref 0.7–4.0)
MCH: 32.4 pg (ref 26.0–34.0)
MCHC: 34.1 g/dL (ref 30.0–36.0)
MCV: 95.1 fL (ref 78.0–100.0)
MONO ABS: 0.6 10*3/uL (ref 0.1–1.0)
Monocytes Relative: 9 %
NEUTROS ABS: 5.3 10*3/uL (ref 1.7–7.7)
Neutrophils Relative %: 73 %
Platelets: 202 10*3/uL (ref 150–400)
RBC: 5.27 MIL/uL (ref 4.22–5.81)
RDW: 16.5 % — AB (ref 11.5–15.5)
WBC: 7.2 10*3/uL (ref 4.0–10.5)

## 2017-09-22 LAB — RAPID URINE DRUG SCREEN, HOSP PERFORMED
Amphetamines: NOT DETECTED
BENZODIAZEPINES: NOT DETECTED
Cocaine: POSITIVE — AB
Opiates: NOT DETECTED
Tetrahydrocannabinol: NOT DETECTED

## 2017-09-22 LAB — ETHANOL: ALCOHOL ETHYL (B): 21 mg/dL — AB (ref <10)

## 2017-09-22 MED ORDER — HYDRALAZINE HCL 20 MG/ML IJ SOLN: 10.0000 mg | Freq: Four times a day (QID) | INTRAMUSCULAR | Status: DC | PRN

## 2017-09-22 MED ORDER — CLONIDINE HCL 0.1 MG PO TABS
0.2000 mg | ORAL_TABLET | Freq: Once | ORAL | Status: AC
  Administered 2017-09-22: 0.2 mg via ORAL
  Filled 2017-09-22: qty 2

## 2017-09-22 MED ORDER — ASPIRIN 325 MG PO TABS
325.0000 mg | ORAL_TABLET | Freq: Every day | ORAL | Status: DC
  Administered 2017-09-22: 325 mg via ORAL
  Filled 2017-09-22: qty 1

## 2017-09-22 MED ORDER — THIAMINE HCL 100 MG/ML IJ SOLN
INTRAVENOUS | Status: DC
  Administered 2017-09-22: 16:00:00 via INTRAVENOUS
  Filled 2017-09-22 (×3): qty 1000

## 2017-09-22 MED ORDER — FOLIC ACID 1 MG PO TABS: 1.0000 mg | ORAL_TABLET | Freq: Every day | ORAL | Status: DC

## 2017-09-22 MED ORDER — THIAMINE HCL 100 MG/ML IJ SOLN: Freq: Once | INTRAVENOUS | Status: DC

## 2017-09-22 MED ORDER — THIAMINE HCL 100 MG/ML IJ SOLN
Freq: Every day | INTRAVENOUS | Status: DC
  Filled 2017-09-22: qty 1000

## 2017-09-22 MED ORDER — VITAMIN B-1 100 MG PO TABS: 100.0000 mg | ORAL_TABLET | Freq: Every day | ORAL | Status: DC

## 2017-09-22 MED ORDER — AMLODIPINE BESYLATE 10 MG PO TABS
10.0000 mg | ORAL_TABLET | Freq: Every day | ORAL | Status: DC
  Administered 2017-09-22 – 2017-09-23 (×2): 10 mg via ORAL
  Filled 2017-09-22 (×2): qty 1
  Filled 2017-09-22: qty 2

## 2017-09-22 MED ORDER — ENOXAPARIN SODIUM 40 MG/0.4ML ~~LOC~~ SOLN
40.0000 mg | SUBCUTANEOUS | Status: DC
  Administered 2017-09-22: 40 mg via SUBCUTANEOUS
  Filled 2017-09-22: qty 0.4

## 2017-09-22 MED ORDER — ADULT MULTIVITAMIN W/MINERALS CH
1.0000 | ORAL_TABLET | Freq: Every day | ORAL | Status: DC
  Administered 2017-09-22 – 2017-09-23 (×2): 1 via ORAL
  Filled 2017-09-22 (×2): qty 1

## 2017-09-22 NOTE — H&P (Signed)
History and Physical    Tim [name]:[id number] DOB: [birth year] DOA: 09/22/2017  PCP: Patient, No Pcp Per Patient coming from: HOME  Chief Complaint: DRUG OD  HPI: Tim [name] is a 42 y.o. male with medical history significant of HTN,ASTHMA admitted with drug overdose.  Patient lives at home  with his girlfriend.  When I saw him he was awake and alert but he was not sure why he is in the hospital.  However he remembers drinking for cups of 4% alcohol last night and took some cocaine.  He reports that he normally does not do all this but he took both last night.  And then his girlfriend was trying to wake him up and he was unresponsive so she called EMS and EMS initiated CPR and gave him Narcan and brought him to the hospital.  Became more alert after the Narcan.  He does not remember anything else.  At this time he denies any chest pain shortness of breath nausea vomiting diarrhea fever chills.  No headaches changes with his vision.   ED Course: Patient received aspirin and was placed on a clonidine patch.  Labs were significant for sodium 142 potassium 3.5 BUN 11 creatinine 1.09 liver function test normal troponin level was 0.05 the second 1 was 0.08 white count 7.2 hemoglobin 17 platelet count 202.  The ED physician spoke to cardiology PA  on call and was told to admit the patient and they will see her tomorrow. Review of Systems: As per HPI otherwise all other systems reviewed and are negative  Ambulatory Status at baseline he ambulates he is not working at this time he used to work at [employer].  He smokes cigarettes, he does drink alcohol, and  he also uses drugs like cocaine.  Past Medical History:  Diagnosis Date  . Asthma   . Hypertension     Past Surgical History:  Procedure Laterality Date  . WISDOM TOOTH EXTRACTION      Social History   Socioeconomic History  . Marital status: Single    Spouse name: Not on file  . Number of children: Not on file    . Years of education: Not on file  . Highest education level: Not on file  Occupational History  . Not on file  Social Needs  . Financial resource strain: Not on file  . Food insecurity:    Worry: Not on file    Inability: Not on file  . Transportation needs:    Medical: Not on file    Non-medical: Not on file  Tobacco Use  . Smoking status: [name] Every Day Smoker    Packs/day: 1.00    Types: Cigarettes  . Smokeless tobacco: Never Used  Substance and Sexual Activity  . Alcohol use: Yes    Comment: occassionally  . Drug use: No  . Sexual activity: Not on file  Lifestyle  . Physical activity:    Days per week: Not on file    Minutes per session: Not on file  . Stress: Not on file  Relationships  . Social connections:    Talks on phone: Not on file    Gets together: Not on file    Attends religious service: Not on file    Active member of club or organization: Not on file    Attends meetings of clubs or organizations: Not on file    Relationship status: Not on file  . Intimate partner violence:    Fear of [name]  or ex partner: Not on file    Emotionally abused: Not on file    Physically abused: Not on file    Forced sexual activity: Not on file  Other Topics Concern  . Not on file  Social History Narrative  . Not on file    No Known Allergies  Family History  Problem Relation Age of Onset  . Hypertension Mother       Prior to Admission medications   Medication Sig Start Date End Date Taking? Authorizing Provider  amLODipine (NORVASC) 10 MG tablet Take 1 tablet (10 mg total) by mouth daily. 08/31/17 09/30/17 Yes Wieters, Hallie C, PA-C  ibuprofen (ADVIL,MOTRIN) 800 MG tablet Take 1 tablet (800 mg total) by mouth 3 (three) times daily. 08/31/17  Yes Lew DawesWieters, Hallie C, [place]PA-C    Physical Exam: Vitals:   09/22/17 0802 09/22/17 1057 09/22/17 1315 09/22/17 1356  BP:  (!) 175/129 (!) 131/91 (!) 143/105  Pulse:  (!) 112 (!) 101 89  Resp:  18 16 16   Temp:       TempSrc:      SpO2:  96% 97% 98%  Weight: 108.9 kg (240 lb)     Height:         General:  Appears calm and comfortable Eyes:  PERRL, EOMI, normal lids, iris ENT:  grossly normal hearing, lips & tongue.Oral  Mucosa dry Neck:  no LAD, masses or thyromegaly Cardiovascular:  RRR, no m/r/g. No LE edema.  Respiratory:  CTA bilaterally, no w/r/r. Normal respiratory effort. Abdomen: soft, ntnd, NABS Skin:  no rash or induration seen on limited exam Musculoskeletal:  grossly normal tone BUE/BLE, good ROM, no bony abnormality Psychiatric:  grossly normal mood and affect, speech fluent and appropriate, AOx3 Neurologic:  CN 2-12 grossly intact, moves all extremities in coordinated fashion, sensation intact  Labs on Admission: I have personally reviewed following labs and imaging studies  CBC: Recent Labs  Lab 09/22/17 0852  WBC 7.2  NEUTROABS 5.3  HGB 17.1*  HCT 50.1  MCV 95.1  PLT 202   Basic Metabolic Panel: Recent Labs  Lab 09/22/17 0852  NA 142  K 3.5  CL 106  CO2 27  GLUCOSE 111*  BUN 11  CREATININE 1.09  CALCIUM 8.9   GFR: Estimated Creatinine Clearance: 113.1 mL/min (by C-G formula based on SCr of 1.09 mg/dL). Liver Function Tests: Recent Labs  Lab 09/22/17 0852  AST 30  ALT 28  ALKPHOS 71  BILITOT 0.5  PROT 7.5  ALBUMIN 4.6   No results for input(s): LIPASE, AMYLASE in the last 168 hours. No results for input(s): AMMONIA in the last 168 hours. Coagulation Profile: No results for input(s): INR, PROTIME in the last 168 hours. Cardiac Enzymes: Recent Labs  Lab 09/22/17 0852 09/22/17 1215  TROPONINI 0.05* 0.08*   BNP (last 3 results) No results for input(s): PROBNP in the last 8760 hours. HbA1C: No results for input(s): HGBA1C in the last 72 hours. CBG: No results for input(s): GLUCAP in the last 168 hours. Lipid Profile: No results for input(s): CHOL, HDL, LDLCALC, TRIG, CHOLHDL, LDLDIRECT in the last 72 hours. Thyroid Function Tests: No  results for input(s): TSH, T4TOTAL, FREET4, T3FREE, THYROIDAB in the last 72 hours. Anemia Panel: No results for input(s): VITAMINB12, FOLATE, FERRITIN, TIBC, IRON, RETICCTPCT in the last 72 hours. Urine analysis:    Component Value Date/Time   COLORURINE STRAW (A) 07/19/2016 0126   APPEARANCEUR CLEAR 07/19/2016 0126   LABSPEC 1.003 (L) 07/19/2016 0126  PHURINE 6.0 07/19/2016 0126   GLUCOSEU NEGATIVE 07/19/2016 0126   HGBUR NEGATIVE 07/19/2016 0126   BILIRUBINUR NEGATIVE 07/19/2016 0126   KETONESUR NEGATIVE 07/19/2016 0126   PROTEINUR NEGATIVE 07/19/2016 0126   UROBILINOGEN 1.0 02/05/2014 1728   NITRITE NEGATIVE 07/19/2016 0126   LEUKOCYTESUR NEGATIVE 07/19/2016 0126    Creatinine Clearance: Estimated Creatinine Clearance: 113.1 mL/min (by C-G formula based on SCr of 1.09 mg/dL).  Sepsis Labs: @LABRCNTIP (procalcitonin:4,lacticidven:4) )No results found for this or any previous visit (from the past 240 hour(s)).   Radiological Exams on Admission: No results found.  EKG: Independently reviewed. T inversions in lateral leads Assessment/Plan Active Problems:   * No active hospital problems. *   1]Drug overdose with cocaine and alcohol-patient was unresponsive at home received CPR and Narcan by EMS.  Patient does not remember what happened.  However he remembers taking alcohol and cocaine prior to sleeping last night.  Reports that he normally does not do this but he did both last night.  He denies any chest pain at this time.  Will keep him on CIWA protocol.  Urine drug screen is positive for cocaine.  I will also given some fluid IV fluids.  2] elevated troponin/cocaine abuse-patient is asymptomatic.  EKG showed some T inversion in lateral  leads.  Will follow EKG and troponin levels.  [facility] has been called by ED physician Dr. Adriana Simas he spoke to [facility] physician assistant.  Who will see the patient tomorrow.  Avoid beta-blockers.  3] history of hypertension patient takes Norvasc at  home restart 10 mg daily.    DVT prophylaxis:lovenox Code Status: full Family Communication:sister and brother in the room Disposition Plan: if stable dc in 24 hours Consults called: edp called [facility] pa Admission status: observation   Alwyn Ren MD [facility]  If 7PM-7AM, please contact night-coverage [email or web address] Password [facility]  09/22/2017, 2:36 PM

## 2017-09-22 NOTE — ED Triage Notes (Signed)
EMS reports from home family called not responsive, apneic, on arrival EMS reports Fire gave 1 IM and 1 IV Narcan with response. Pt denies opiate use, family states possibly snorted cocaine and heroin, EMS reports pinpoint pupils on scene, currently A&O x 4.  BP 200/130 HR 90 Resp 20 Sp02 100 RA  CBG 200  18ga RAC

## 2017-09-22 NOTE — ED Notes (Signed)
ED TO INPATIENT HANDOFF REPORT  Name/Age/Gender Tim [name] 42 y.o. male  Code Status    Code Status Orders  (From admission, onward)        Start     Ordered   09/22/17 1459  Full code  Continuous     09/22/17 1502    Code Status History    This patient has a current code status but no historical code status.      Home/SNF/Other Home  Chief Complaint OD  Level of Care/Admitting Diagnosis ED Disposition    ED Disposition Condition Comment   Admit  Hospital Area: [facility] [[id number]]  Level of Care: Telemetry [5]  Admit to tele based on following criteria: Monitor for Ischemic changes  Diagnosis: OD (overdose of drug) [166063]  Admitting Physician: Georgette Shell [[id number]]  Attending Physician: Georgette Shell [[id number]]  PT Class (Do Not Modify): Observation [104]  PT Acc Code (Do Not Modify): Observation [10022]       Medical History Past Medical History:  Diagnosis Date  . Asthma   . Elevated troponin I level 09/22/2017  . Hypertension     Allergies No Known Allergies  IV Location/Drains/Wounds Patient Lines/Drains/Airways Status   Active Line/Drains/Airways    Name:   Placement date:   Placement time:   Site:   Days:   Peripheral IV 09/22/17 Right Antecubital   09/22/17    0803    Antecubital   less than 1          Labs/Imaging Results for orders placed or performed during the hospital encounter of 09/22/17 (from the past 48 hour(s))  Urine rapid drug screen (hosp performed)     Status: Abnormal   Collection Time: 09/22/17  8:37 AM  Result Value Ref Range   Opiates NONE DETECTED NONE DETECTED   Cocaine POSITIVE (A) NONE DETECTED   Benzodiazepines NONE DETECTED NONE DETECTED   Amphetamines NONE DETECTED NONE DETECTED   Tetrahydrocannabinol NONE DETECTED NONE DETECTED   Barbiturates (A) NONE DETECTED    Result not available. Reagent lot number recalled by manufacturer.    Comment: Performed at [facility], [place] [address] [place], [place] [address]  CBC with Differential     Status: Abnormal   Collection Time: 09/22/17  8:52 AM  Result Value Ref Range   WBC 7.2 4.0 - 10.5 K/uL   RBC 5.27 4.22 - 5.81 MIL/uL   Hemoglobin 17.1 (H) 13.0 - 17.0 g/dL   HCT 50.1 39.0 - 52.0 %   MCV 95.1 78.0 - 100.0 fL   MCH 32.4 26.0 - 34.0 pg   MCHC 34.1 30.0 - 36.0 g/dL   RDW 16.5 (H) 11.5 - 15.5 %   Platelets 202 150 - 400 K/uL   Neutrophils Relative % 73 %   Neutro Abs 5.3 1.7 - 7.7 K/uL   Lymphocytes Relative 16 %   Lymphs Abs 1.2 0.7 - 4.0 K/uL   Monocytes Relative 9 %   Monocytes Absolute 0.6 0.1 - 1.0 K/uL   Eosinophils Relative 2 %   Eosinophils Absolute 0.1 0.0 - 0.7 K/uL   Basophils Relative 0 %   Basophils Absolute 0.0 0.0 - 0.1 K/uL    Comment: Performed at [facility], [place] [address] [place], [place] [address]  Comprehensive metabolic panel     Status: Abnormal   Collection Time: 09/22/17  8:52 AM  Result Value Ref Range   Sodium 142 135 - 145 mmol/L  Potassium 3.5 3.5 - 5.1 mmol/L   Chloride 106 101 - 111 mmol/L   CO2 27 22 - 32 mmol/L   Glucose, Bld 111 (H) 65 - 99 mg/dL   BUN 11 6 - 20 mg/dL   Creatinine, Ser 1.09 0.61 - 1.24 mg/dL   Calcium 8.9 8.9 - 10.3 mg/dL   Total Protein 7.5 6.5 - 8.1 g/dL   Albumin 4.6 3.5 - 5.0 g/dL   AST 30 15 - 41 U/L   ALT 28 17 - 63 U/L   Alkaline Phosphatase 71 38 - 126 U/L   Total Bilirubin 0.5 0.3 - 1.2 mg/dL   GFR calc non Af Amer >60 >60 mL/min   GFR calc Af Amer >60 >60 mL/min    Comment: (NOTE) The eGFR has been calculated using the CKD EPI equation. This calculation has not been validated in all clinical situations. eGFR's persistently <60 mL/min signify possible Chronic Kidney Disease.    Anion gap 9 5 - 15    Comment: Performed at [facility], [place] [address], [place], [place] [address]  Ethanol     Status: Abnormal   Collection Time: 09/22/17  8:52 AM  Result Value Ref  Range   Alcohol, Ethyl (B) 21 (H) <10 mg/dL    Comment: (NOTE) Lowest detectable limit for serum alcohol is 10 mg/dL. For medical purposes only. Performed at [facility], [place] [address] [place], [place] [address]   Troponin I     Status: Abnormal   Collection Time: 09/22/17  8:52 AM  Result Value Ref Range   Troponin I 0.05 (HH) <0.03 ng/mL    Comment: CRITICAL RESULT CALLED TO, READ BACK BY AND VERIFIED WITH: Asher Muir [id number] 09/22/2017 HILL K Performed at [facility], [place] [address] [place], [place] [address]   Troponin I     Status: Abnormal   Collection Time: 09/22/17 12:15 PM  Result Value Ref Range   Troponin I 0.08 (HH) <0.03 ng/mL    Comment: CRITICAL VALUE NOTED.  VALUE IS CONSISTENT WITH PREVIOUSLY REPORTED AND CALLED VALUE. Performed at [facility], [place] [address], [place], [place] [address]   Troponin I     Status: Abnormal   Collection Time: 09/22/17  3:03 PM  Result Value Ref Range   Troponin I 0.06 (HH) <0.03 ng/mL    Comment: CRITICAL VALUE NOTED.  VALUE IS CONSISTENT WITH PREVIOUSLY REPORTED AND CALLED VALUE. Performed at [facility], [place] [address], [place], [place] [address]    No results found.  Pending Labs Unresulted Labs (From admission, onward)   Start     Ordered   09/23/17 0500  Comprehensive metabolic panel  Tomorrow morning,   R     09/22/17 1502   09/23/17 0500  CBC  Tomorrow morning,   R     09/22/17 1502   09/22/17 1500  Troponin I  Now then every 6 hours,   R     09/22/17 1502   09/22/17 1457  HIV antibody (Routine Testing)  Once,   R     09/22/17 1502      Vitals/Pain Today's Vitals   09/22/17 1356 09/22/17 1500 09/22/17 1501 09/22/17 1609  BP: (!) 143/105 119/75 119/75 (!) 171/111  Pulse: 89 100 98 (!) 103  Resp: _0 Temp:      TempSrc:      SpO2: 98% 93% 96% 99%  Weight:  Height:      PainSc:        Isolation Precautions No active  isolations  Medications Medications  aspirin tablet 325 mg (325 mg Oral Given 09/22/17 1346)  enoxaparin (LOVENOX) injection 40 mg (has no administration in time range)  multivitamin with minerals tablet 1 tablet (1 tablet Oral Given 09/22/17 1536)  hydrALAZINE (APRESOLINE) injection 10 mg (has no administration in time range)  sodium chloride 0.9 % 1,000 mL with thiamine 797 mg, folic acid 1 mg, multivitamins adult 10 mL infusion ( Intravenous New Bag/Given 09/22/17 1537)  amLODipine (NORVASC) tablet 10 mg (10 mg Oral Given 09/22/17 1536)  cloNIDine (CATAPRES) tablet 0.2 mg (0.2 mg Oral Given 09/22/17 1056)    Mobility walks

## 2017-09-22 NOTE — ED Notes (Signed)
[facility] Expected date: 09/22/17 Expected time: 7:54 AM Means of arrival: Ambulance Comments: EMS-OD

## 2017-09-22 NOTE — ED Provider Notes (Signed)
[facility]-EMERGENCY DEPT Provider Note   CSN: [id number] Arrival date & time: 09/22/17  0754     History   Chief Complaint Chief Complaint  Patient presents with  . Drug Overdose    HPI Tim GrieveOctavius K [name] is a 42 y.o. male.  Level 5 caveat for uncertainty of history.  History obtained from patient, girlfriend, EMS.  Patient said he drank alcohol last night and might have taken some drugs, but he is uncertain.  His girlfriend stated that he was not breathing in the middle the night.  CPR was initiated by roommate.  EMS arrived and patient was given Narcan IM and IV.  He seemed to have a positive response to this.  Patient does not remember using heroin last night.  He could have used cocaine.  Past medical history hypertension which is been poorly controlled.     Past Medical History:  Diagnosis Date  . Asthma   . Hypertension     There are no active problems to display for this patient.   Past Surgical History:  Procedure Laterality Date  . WISDOM TOOTH EXTRACTION          Home Medications    Prior to Admission medications   Medication Sig Start Date End Date Taking? Authorizing Provider  amLODipine (NORVASC) 10 MG tablet Take 1 tablet (10 mg total) by mouth daily. 08/31/17 09/30/17 Yes Wieters, Hallie C, PA-C  ibuprofen (ADVIL,MOTRIN) 800 MG tablet Take 1 tablet (800 mg total) by mouth 3 (three) times daily. 08/31/17  Yes Wieters, Junius CreamerHallie C, PA-C    Family History Family History  Problem Relation Age of Onset  . Hypertension Mother     Social History Social History   Tobacco Use  . Smoking status: Current Every Day Smoker    Packs/day: 1.00    Types: Cigarettes  . Smokeless tobacco: Never Used  Substance Use Topics  . Alcohol use: Yes    Comment: occassionally  . Drug use: No     Allergies   Patient has no known allergies.   Review of Systems Review of Systems  Unable to perform ROS: Acuity of condition     Physical  Exam Updated Vital Signs BP (!) 143/105   Pulse 89   Temp 98.2 F (36.8 C) (Oral)   Resp 16   Ht 6\' 1"  (1.854 m)   Wt 108.9 kg (240 lb)   SpO2 98%   BMI 31.66 kg/m   Physical Exam  Constitutional: He is oriented to person, place, and time.  Sleepy but easily aroused.  HENT:  Head: Normocephalic and atraumatic.  Eyes: Conjunctivae are normal.  Neck: Neck supple.  Cardiovascular: Normal rate and regular rhythm.  Pulmonary/Chest: Effort normal and breath sounds normal.  Abdominal: Soft. Bowel sounds are normal.  Musculoskeletal: Normal range of motion.  Neurological: He is alert and oriented to person, place, and time.  Skin: Skin is warm and dry.  Psychiatric:  Flat affect  Nursing note and vitals reviewed.    ED Treatments / Results  Labs (all labs ordered are listed, but only abnormal results are displayed) Labs Reviewed  CBC WITH DIFFERENTIAL/PLATELET - Abnormal; Notable for the following components:      Result Value   Hemoglobin 17.1 (*)    RDW 16.5 (*)    All other components within normal limits  COMPREHENSIVE METABOLIC PANEL - Abnormal; Notable for the following components:   Glucose, Bld 111 (*)    All other components within normal limits  ETHANOL - Abnormal; Notable for the following components:   Alcohol, Ethyl (B) 21 (*)    All other components within normal limits  RAPID URINE DRUG SCREEN, HOSP PERFORMED - Abnormal; Notable for the following components:   Cocaine POSITIVE (*)    Barbiturates   (*)    Value: Result not available. Reagent lot number recalled by manufacturer.   All other components within normal limits  TROPONIN I - Abnormal; Notable for the following components:   Troponin I 0.05 (*)    All other components within normal limits  TROPONIN I - Abnormal; Notable for the following components:   Troponin I 0.08 (*)    All other components within normal limits    EKG EKG Interpretation  Date/Time:  Friday September 22 2017 08:56:55  EDT Ventricular Rate:  106 PR Interval:  142 QRS Duration: 98 QT Interval:  350 QTC Calculation: 464 R Axis:   42 Text Interpretation:  Sinus tachycardia Moderate voltage criteria for LVH, may be normal variant ST & T wave abnormality, consider inferior ischemia Abnormal ECG Confirmed by Donnetta Hutching ([id number]) on 09/22/2017 1:22:17 PM   Radiology No results found.  Procedures Procedures (including critical care time)  Medications Ordered in ED Medications  aspirin tablet 325 mg (325 mg Oral Given 09/22/17 1346)  cloNIDine (CATAPRES) tablet 0.2 mg (0.2 mg Oral Given 09/22/17 1056)     Initial Impression / Assessment and Plan / ED Course  I have reviewed the triage vital signs and the nursing notes.  Pertinent labs & imaging results that were available during my care of the patient were reviewed by me and considered in my medical decision making (see chart for details).   Patient presents with apnea which resulted in CPR.  Uncertain etiology of apnea.  He appears normal on physical exam.  Drug screen positive for cocaine, but no opiates.  EKG shows new T wave inversion inferiorly and laterally.  Troponin elevated x2 different occasions, 0.05 and 0.08.  Discussed with cardiology.  Admit to general medicine.   CRITICAL CARE Performed by: Donnetta Hutching Total critical care time: 30 minutes Critical care time was exclusive of separately billable procedures and treating other patients. Critical care was necessary to treat or prevent imminent or life-threatening deterioration. Critical care was time spent personally by me on the following activities: development of treatment plan with patient and/or surrogate as well as nursing, discussions with consultants, evaluation of patient's response to treatment, examination of patient, obtaining history from patient or surrogate, ordering and performing treatments and interventions, ordering and review of laboratory studies, ordering and review of  radiographic studies, pulse oximetry and re-evaluation of patient's condition.    Final Clinical Impressions(s) / ED Diagnoses   Final diagnoses:  Elevated troponin  Hypertension, unspecified type    ED Discharge Orders    None       Donnetta Hutching, MD 09/22/17 1426

## 2017-09-23 ENCOUNTER — Observation Stay (HOSPITAL_BASED_OUTPATIENT_CLINIC_OR_DEPARTMENT_OTHER): Payer: Self-pay

## 2017-09-23 ENCOUNTER — Encounter (HOSPITAL_COMMUNITY): Payer: Self-pay | Admitting: Internal Medicine

## 2017-09-23 DIAGNOSIS — R748 Abnormal levels of other serum enzymes: Secondary | ICD-10-CM

## 2017-09-23 DIAGNOSIS — R55 Syncope and collapse: Secondary | ICD-10-CM

## 2017-09-23 DIAGNOSIS — I351 Nonrheumatic aortic (valve) insufficiency: Secondary | ICD-10-CM

## 2017-09-23 DIAGNOSIS — I10 Essential (primary) hypertension: Secondary | ICD-10-CM

## 2017-09-23 DIAGNOSIS — I472 Ventricular tachycardia: Secondary | ICD-10-CM

## 2017-09-23 DIAGNOSIS — I5040 Unspecified combined systolic (congestive) and diastolic (congestive) heart failure: Secondary | ICD-10-CM

## 2017-09-23 DIAGNOSIS — F1910 Other psychoactive substance abuse, uncomplicated: Secondary | ICD-10-CM

## 2017-09-23 HISTORY — PX: TRANSTHORACIC ECHOCARDIOGRAM: SHX275

## 2017-09-23 LAB — COMPREHENSIVE METABOLIC PANEL
ALBUMIN: 3.7 g/dL (ref 3.5–5.0)
ALT: 22 U/L (ref 17–63)
ANION GAP: 7 (ref 5–15)
AST: 23 U/L (ref 15–41)
Alkaline Phosphatase: 73 U/L (ref 38–126)
BUN: 13 mg/dL (ref 6–20)
CO2: 28 mmol/L (ref 22–32)
Calcium: 8.5 mg/dL — ABNORMAL LOW (ref 8.9–10.3)
Chloride: 106 mmol/L (ref 101–111)
Creatinine, Ser: 1.15 mg/dL (ref 0.61–1.24)
GFR calc Af Amer: >60 mL/min (ref >60)
GFR calc non Af Amer: >60 mL/min (ref >60)
GLUCOSE: 105 mg/dL — AB (ref 65–99)
POTASSIUM: 4.1 mmol/L (ref 3.5–5.1)
SODIUM: 141 mmol/L (ref 135–145)
TOTAL PROTEIN: 6.2 g/dL — AB (ref 6.5–8.1)
Total Bilirubin: 0.3 mg/dL (ref 0.3–1.2)

## 2017-09-23 LAB — CBC
HCT: 45.5 % (ref 39.0–52.0)
Hemoglobin: 14.8 g/dL (ref 13.0–17.0)
MCH: 31.5 pg (ref 26.0–34.0)
MCHC: 32.5 g/dL (ref 30.0–36.0)
MCV: 96.8 fL (ref 78.0–100.0)
PLATELETS: 195 10*3/uL (ref 150–400)
RBC: 4.7 MIL/uL (ref 4.22–5.81)
RDW: 16.7 % — AB (ref 11.5–15.5)
WBC: 6.3 10*3/uL (ref 4.0–10.5)

## 2017-09-23 LAB — ECHOCARDIOGRAM COMPLETE
HEIGHTINCHES: 73 in
WEIGHTICAEL: 3840 [oz_av]

## 2017-09-23 LAB — TROPONIN I: Troponin I: 0.03 ng/mL (ref <0.03)

## 2017-09-23 LAB — HIV ANTIBODY (ROUTINE TESTING W REFLEX): HIV SCREEN 4TH GENERATION: NONREACTIVE

## 2017-09-23 MED ORDER — LISINOPRIL 5 MG PO TABS: 5.0000 mg | ORAL_TABLET | Freq: Every day | ORAL | 0 refills | Status: DC

## 2017-09-23 NOTE — Progress Notes (Signed)
Tim [name] to be D/C'd Home per MD order.  Discussed prescriptions and follow up appointments with the patient. Prescriptions given to patient, medication list explained in detail. Pt verbalized understanding.  Allergies as of 09/23/2017   No Known Allergies     Medication List    TAKE these medications   amLODipine 10 MG tablet Commonly known as:  NORVASC Take 1 tablet (10 mg total) by mouth daily.   ibuprofen 800 MG tablet Commonly known as:  ADVIL,MOTRIN Take 1 tablet (800 mg total) by mouth 3 (three) times daily.   lisinopril 5 MG tablet Commonly known as:  PRINIVIL,ZESTRIL Take 1 tablet (5 mg total) by mouth daily.       Vitals:   09/23/17 0441 09/23/17 1336  BP: (!) 133/91 (!) 143/97  Pulse: 61 78  Resp: 16 16  Temp: 98.1 F (36.7 C) 98.2 F (36.8 C)  SpO2: 99% 100%    Skin clean, dry and intact without evidence of skin break down, no evidence of skin tears noted. IV catheter discontinued intact. Site without signs and symptoms of complications. Dressing and pressure applied. Pt denies pain at this time. No complaints noted.  An After Visit Summary was printed and given to the patient. Patient escorted via WC, and D/C home via private auto.  Tim [name] BSN, RN [employer] [facility] Phone [phone]

## 2017-09-23 NOTE — Consult Note (Signed)
CARDIOLOGY CONSULT NOTE     Primary Care Physician: Patient, No Pcp Per Referring Physician:  Dr Jerolyn CenterMathews  Admit Date: 09/22/2017  Reason for consultation:  + troponin  Tim BabinskiOctavius K XXXParker is a 42 y.o. male with a h/o hypertension, polysubstance abuse and medical noncompliance now admitted after overdose.  He reports "partying a little bit" yesterday.  He drank ETOH and used cocaine.  He then fell asleep.  He is clear that he did not have syncope. He was subsequently found unresponsive and difficult to arouse by his girlfriend.  [facility].  He was noted to have significant elevation in BP upon arrival.  He has been observed overnight, without symptoms.  He had asymptomatic 11 beat NSVT.  Today, he denies symptoms of palpitations, chest pain, shortness of breath, orthopnea, PND, lower extremity edema, dizziness, presyncope, syncope, or neurologic sequela. He reports being active typically without CV symptoms.  The patient is tolerating medications without difficulties and is otherwise without complaint today.   Past Medical History:  Diagnosis Date  . Asthma   . Hypertension   . Noncompliance   . Polysubstance abuse [facility](HCC)    Past Surgical History:  Procedure Laterality Date  . WISDOM TOOTH EXTRACTION      . amLODipine  10 mg Oral Daily  . aspirin  325 mg Oral Daily  . enoxaparin (LOVENOX) injection  40 mg Subcutaneous Q24H  . multivitamin with minerals  1 tablet Oral Daily   . banana bag IV 1000 mL      No Known Allergies  Social History   Socioeconomic History  . Marital status: Single    Spouse name: Not on file  . Number of children: Not on file  . Years of education: Not on file  . Highest education level: Not on file  Occupational History  . Not on file  Social Needs  . Financial resource strain: Not on file  . Food insecurity:    Worry: Not on file    Inability: Not on file  . Transportation needs:    Medical: Not on file   Non-medical: Not on file  Tobacco Use  . Smoking status: Current Every Day Smoker    Packs/day: 1.00    Types: Cigarettes  . Smokeless tobacco: Never Used  Substance and Sexual Activity  . Alcohol use: Yes    Comment: occassionally  . Drug use: No  . Sexual activity: Not on file  Lifestyle  . Physical activity:    Days per week: Not on file    Minutes per session: Not on file  . Stress: Not on file  Relationships  . Social connections:    Talks on phone: Not on file    Gets together: Not on file    Attends religious service: Not on file    Active member of club or organization: Not on file    Attends meetings of clubs or organizations: Not on file    Relationship status: Not on file  . Intimate partner violence:    Fear of current or ex partner: Not on file    Emotionally abused: Not on file    Physically abused: Not on file    Forced sexual activity: Not on file  Other Topics Concern  . Not on file  Social History Narrative   Previously incarcerated    Family History  Problem Relation Age of Onset  . Hypertension Mother     ROS- All systems are reviewed  and negative except as per the HPI above  Physical Exam: Telemetry: sinus rhythm, single 11 beat NSVT episode noted Vitals:   09/22/17 1609 09/22/17 1644 09/22/17 2159 09/23/17 0441  BP: (!) 171/111 (!) 142/99 (!) 147/101 (!) 133/91  Pulse: (!) 103 69 98 61  Resp: 16 19 18 16   Temp:  98.3 F (36.8 C) 98.4 F (36.9 C) 98.1 F (36.7 C)  TempSrc:  Oral Oral Oral  SpO2: 99% 97% 100% 99%  Weight:      Height:        GEN- The patient is well appearing, alert and oriented x 3 today.   Head- normocephalic, atraumatic Eyes-  Sclera clear, conjunctiva pink Ears- hearing intact Oropharynx- clear Neck- supple, no JVP Lymph- no cervical lymphadenopathy Lungs- Clear to ausculation bilaterally, normal work of breathing Heart- Regular rate and rhythm, no murmurs, rubs or gallops, PMI not laterally displaced GI-  soft, NT, ND, + BS Extremities- no clubbing, cyanosis, or edema MS- no significant deformity or atrophy Skin- no rash or lesion Psych- euthymic mood, full affect Neuro- strength and sensation are intact  EKG:  Sinus rhythm with LVH and repolarization changes  Labs:   Lab Results  Component Value Date   WBC 6.3 09/23/2017   HGB 14.8 09/23/2017   HCT 45.5 09/23/2017   MCV 96.8 09/23/2017   PLT 195 09/23/2017    Recent Labs  Lab 09/23/17 0314  NA 141  K 4.1  CL 106  CO2 28  BUN 13  CREATININE 1.15  CALCIUM 8.5*  PROT 6.2*  BILITOT 0.3  ALKPHOS 73  ALT 22  AST 23  GLUCOSE 105*   Lab Results  Component Value Date   TROPONINI 0.03 (HH) 09/23/2017   No results found for: CHOL No results found for: HDL No results found for: LDLCALC No results found for: TRIG No results found for: CHOLHDL No results found for: LDLDIRECT     ASSESSMENT AND PLAN:   1. Hypertension Improved with norvasc Would continue this on discharge Lifestyle modification with avoidance of ETOH and cocaine is necessary Echo to evaluate for structural changes  2. Polysubstance abuse Lifestyle modification with avoidance of ETOH and cocaine is necessary  3. Trivial elevation in trop Not consistent with ACS Likely demand event from cocaine No further inpatient CV workup planned Stop ASA  4. NSVT Asymptomatic Echo If low risk, no further CV workup planned  Anticipate discharge later today, post echo If normal echo, no further CV follow-up is advised  Hillis Range, MD 09/23/2017  8:22 AM

## 2017-09-23 NOTE — Progress Notes (Signed)
Echo reviewed, with significant reduction in cardiac function noted.  Pt now feels well and declines further inpatient workup per my discussion with Dr Sharion DoveZapata.  I will therefore schedule outpatient follow-up with general cardiology for further assessment.  Plans for discharge today are noted.  I have advised initiation of ACE inhibitor.  Once no longer using cocaine, would start coreg.  Hillis RangeJames Oyinkansola Truax MD, [facility] 09/23/2017 2:53 PM

## 2017-09-23 NOTE — Progress Notes (Signed)
  Echocardiogram 2D Echocardiogram has been performed.  Leta JunglingCooper, Shaylon Aden M 09/23/2017, 9:14 AM

## 2017-09-23 NOTE — Discharge Summary (Addendum)
Physician Discharge Summary  Tim [name]  LKG:[id number]  DOB: 09/13/1975  DOA: 09/22/2017 PCP: Patient, No Pcp Per  Admit date: 09/22/2017 Discharge date: 09/23/2017  Admitted From: Home  Disposition: Home   Recommendations for Outpatient Follow-up:  1. Need to establish care with PCP, information for the community health center given 2. Cardiology follow up, for CHF management   Discharge Condition: Stable CODE STATUS: Full code Diet recommendation: Heart Healthy   Brief/Interim Summary: For full details see H&P/Progress note, but in brief, Tim [name] is a 42 year old male with medical history of hypertension asthma who presented to the emergency department after his girlfriend found him unresponsive.  CPR was performed by roommate. EMS was alert, patient was given Narcan with positive response. Subsequently brought to the emergency department for further evaluation.  Patient reported using cocaine prior to the event, but denies heroine.  Upon ED evaluation appeared normal on physical exam, UDS positive for cocaine and opiates, EKG showed new T wave inversion, with mild elevated troponin.  Patient was admitted to rule out ACS induced by cocaine.  Subjective: Patient seen and examined, he is chest pain-free.  Denies shortness of breath, palpitation and weakness.  Tolerating diet well and ambulating with no issues.  Patient is anxious to go home.  Discharge Diagnoses/Hospital Course:  Drug overdose/syncope Patient was drinking alcohol and using cocaine. EMS provided Narcan with mild response.  Cocaine induced demand ischemia causing syncopal episode. Resolved.  Lifestyle modification with avoidance of EtOH and cocaine  Hypertension Stable with Norvasc Continue Norvasc, avoid beta-blockers for now given cocaine use.   Mild elevated troponin Felt to be secondary due to demand ischemia from cocaine Cardiology evaluated patient and no further work-up  recommended Echocardiogram diffuse hypokinesis, worse in anterior and anterolateral myocardium. LVEF 25-30%  New Systolic and diastolic dysfunction - not on acute exacerbation  Multifactorial EtOH, drug abuse, long standing uncontrolled HTN  Case discussed with cardiology, arranging follow up as outpatient this week  I have discussed results with patient and he wants to go home, since he is asymptomatic will discharge patient on Lisinopril 5 mg daily, will hold on beta-blockers for now given cocaine use, he eventually will benefit from beta-blockers. Stressed the importance of not drinking alcohol, using cocaine or any other drugs.  Patient will need cardiac work-up as an outpatient.  Patient verbalized understanding and assured that he will follow-up with cardiology.   Polysubstance abuse Cessation discussed  All other chronic medical condition were stable during the hospitalization.  On the day of the discharge the patient's vitals were stable, and no other acute medical condition were reported by patient. the patient was felt safe to be discharge to home  Discharge Instructions  You were cared for by a hospitalist during your hospital stay. If you have any questions about your discharge medications or the care you received while you were in the hospital after you are discharged, you can call the unit and asked to speak with the hospitalist on call if the hospitalist that took care of you is not available. Once you are discharged, your primary care physician will handle any further medical issues. Please note that NO REFILLS for any discharge medications will be authorized once you are discharged, as it is imperative that you return to your primary care physician (or establish a relationship with a primary care physician if you do not have one) for your aftercare needs so that they can reassess your need for medications and monitor your lab  values.  Discharge Instructions    Call MD for:   difficulty breathing, headache or visual disturbances   Complete by:  As directed    Call MD for:  extreme fatigue   Complete by:  As directed    Call MD for:  hives   Complete by:  As directed    Call MD for:  persistant dizziness or light-headedness   Complete by:  As directed    Call MD for:  persistant nausea and vomiting   Complete by:  As directed    Call MD for:  redness, tenderness, or signs of infection (pain, swelling, redness, odor or green/yellow discharge around incision site)   Complete by:  As directed    Call MD for:  severe uncontrolled pain   Complete by:  As directed    Call MD for:  temperature >100.4   Complete by:  As directed    Diet - low sodium heart healthy   Complete by:  As directed    Increase activity slowly   Complete by:  As directed      Allergies as of 09/23/2017   No Known Allergies     Medication List    TAKE these medications   amLODipine 10 MG tablet Commonly known as:  NORVASC Take 1 tablet (10 mg total) by mouth daily.   ibuprofen 800 MG tablet Commonly known as:  ADVIL,MOTRIN Take 1 tablet (800 mg total) by mouth 3 (three) times daily.   lisinopril 5 MG tablet Commonly known as:  PRINIVIL,ZESTRIL Take 1 tablet (5 mg total) by mouth daily.      Follow-up Information    Hillis Range, MD. Call on 09/25/2017.   Specialty:  Cardiology Why:  Call to comfirm appointmet. Hospital follow up for new CHF  Contact information: [address] [place] [address] [phone]        [facility]. Call on 09/25/2017.   Why:  Call to establish care with primary care doctor Contact information: [address] [place] [address] [phone]         No Known Allergies  Consultations:  Cardiology   Procedures/Studies: Dg Hand Complete Left  Result Date: 08/31/2017 CLINICAL DATA:  Fracture 1 month ago, metal bed railing fell off truck and struck his LEFT hand  while moving furniture, pain at fourth and fifth metacarpals EXAM: LEFT HAND - COMPLETE 3+ VIEW COMPARISON:  08/06/2017 FINDINGS: Osseous mineralization normal. Joint spaces preserved. Subacute minimally displaced fracture identified at base of fifth metacarpal. No additional fracture, dislocation, or bone destruction. IMPRESSION: Subacute minimally displaced fracture at base of LEFT fifth metacarpal. Electronically Signed   By: Ulyses Southward M.D.   On: 08/31/2017 16:54       Discharge Exam: Vitals:   09/23/17 0441 09/23/17 1336  BP: (!) 133/91 (!) 143/97  Pulse: 61 78  Resp: 16 16  Temp: 98.1 F (36.7 C) 98.2 F (36.8 C)  SpO2: 99% 100%   Vitals:   09/22/17 1644 09/22/17 2159 09/23/17 0441 09/23/17 1336  BP: (!) 142/99 (!) 147/101 (!) 133/91 (!) 143/97  Pulse: 69 98 61 78  Resp: 19 18 16 16   Temp: 98.3 F (36.8 C) 98.4 F (36.9 C) 98.1 F (36.7 C) 98.2 F (36.8 C)  TempSrc: Oral Oral Oral Oral  SpO2: 97% 100% 99% 100%  Weight:      Height:        General: Pt is alert, awake, not  in acute distress Cardiovascular: RRR, S1/S2 +, no rubs, no gallops Respiratory: CTA bilaterally, no wheezing, no rhonchi Abdominal: Soft, NT, ND, bowel sounds + Extremities: no edema, no cyanosis  The results of significant diagnostics from this hospitalization (including imaging, microbiology, ancillary and laboratory) are listed below for reference.     Microbiology: No results found for this or any previous visit (from the past 240 hour(s)).   Labs: BNP (last 3 results) No results for input(s): BNP in the last 8760 hours. Basic Metabolic Panel: Recent Labs  Lab 09/22/17 0852 09/23/17 0314  NA 142 141  K 3.5 4.1  CL 106 106  CO2 27 28  GLUCOSE 111* 105*  BUN 11 13  CREATININE 1.09 1.15  CALCIUM 8.9 8.5*   Liver Function Tests: Recent Labs  Lab 09/22/17 0852 09/23/17 0314  AST 30 23  ALT 28 22  ALKPHOS 71 73  BILITOT 0.5 0.3  PROT 7.5 6.2*  ALBUMIN 4.6 3.7   No results  for input(s): LIPASE, AMYLASE in the last 168 hours. No results for input(s): AMMONIA in the last 168 hours. CBC: Recent Labs  Lab 09/22/17 0852 09/23/17 0314  WBC 7.2 6.3  NEUTROABS 5.3  --   HGB 17.1* 14.8  HCT 50.1 45.5  MCV 95.1 96.8  PLT 202 195   Cardiac Enzymes: Recent Labs  Lab 09/22/17 0852 09/22/17 1215 09/22/17 1503 09/22/17 2111 09/23/17 0314  TROPONINI 0.05* 0.08* 0.06* 0.04* 0.03*   BNP: Invalid input(s): POCBNP CBG: No results for input(s): GLUCAP in the last 168 hours. D-Dimer No results for input(s): DDIMER in the last 72 hours. Hgb A1c No results for input(s): HGBA1C in the last 72 hours. Lipid Profile No results for input(s): CHOL, HDL, LDLCALC, TRIG, CHOLHDL, LDLDIRECT in the last 72 hours. Thyroid function studies No results for input(s): TSH, T4TOTAL, T3FREE, THYROIDAB in the last 72 hours.  Invalid input(s): FREET3 Anemia work up No results for input(s): VITAMINB12, FOLATE, FERRITIN, TIBC, IRON, RETICCTPCT in the last 72 hours. Urinalysis    Component Value Date/Time   COLORURINE STRAW (A) 07/19/2016 0126   APPEARANCEUR CLEAR 07/19/2016 0126   LABSPEC 1.003 (L) 07/19/2016 0126   PHURINE 6.0 07/19/2016 0126   GLUCOSEU NEGATIVE 07/19/2016 0126   HGBUR NEGATIVE 07/19/2016 0126   BILIRUBINUR NEGATIVE 07/19/2016 0126   KETONESUR NEGATIVE 07/19/2016 0126   PROTEINUR NEGATIVE 07/19/2016 0126   UROBILINOGEN 1.0 02/05/2014 1728   NITRITE NEGATIVE 07/19/2016 0126   LEUKOCYTESUR NEGATIVE 07/19/2016 0126   Sepsis Labs Invalid input(s): PROCALCITONIN,  WBC,  LACTICIDVEN Microbiology No results found for this or any previous visit (from the past 240 hour(s)).   Time coordinating discharge: 40 minutes  SIGNED:  Latrelle Dodrill, MD  [facility] 09/23/2017, 2:46 PM  Pager please text page via  [email or web address]  Note - This record has been created using [employer]. Chart creation errors have been sought, but may not always have been  located. Such creation errors do not reflect on the standard of medical care.

## 2017-09-23 NOTE — Progress Notes (Signed)
11 beat run v-tack at 0230 asymptomatic troponin 0.3/abnormal  ekg  On call aware

## 2017-09-25 ENCOUNTER — Telehealth: Payer: Self-pay | Admitting: Internal Medicine

## 2017-09-25 NOTE — Telephone Encounter (Signed)
Informed the pt that per his discharge instructions from the hospital, he is to continue to taking amlodipine and take new regimen of lisinopril.  Pt verbalized understanding and agrees with this plan.

## 2017-09-25 NOTE — Telephone Encounter (Signed)
Pt seen in ER was taking Amlodipine and was given Lisinopril  , wants to know if he is to take both or one or the other? pls advise [phone]

## 2017-10-04 ENCOUNTER — Ambulatory Visit (INDEPENDENT_AMBULATORY_CARE_PROVIDER_SITE_OTHER): Payer: Self-pay | Admitting: Cardiology

## 2017-10-04 ENCOUNTER — Encounter: Payer: Self-pay | Admitting: Cardiology

## 2017-10-04 VITALS — BP 130/86 | HR 80 | Ht 73.0 in | Wt 230.0 lb

## 2017-10-04 DIAGNOSIS — I10 Essential (primary) hypertension: Secondary | ICD-10-CM

## 2017-10-04 DIAGNOSIS — R748 Abnormal levels of other serum enzymes: Secondary | ICD-10-CM

## 2017-10-04 DIAGNOSIS — R778 Other specified abnormalities of plasma proteins: Secondary | ICD-10-CM

## 2017-10-04 DIAGNOSIS — R7989 Other specified abnormal findings of blood chemistry: Secondary | ICD-10-CM

## 2017-10-04 DIAGNOSIS — F1910 Other psychoactive substance abuse, uncomplicated: Secondary | ICD-10-CM | POA: Insufficient documentation

## 2017-10-04 DIAGNOSIS — T50901S Poisoning by unspecified drugs, medicaments and biological substances, accidental (unintentional), sequela: Secondary | ICD-10-CM

## 2017-10-04 DIAGNOSIS — I420 Dilated cardiomyopathy: Secondary | ICD-10-CM | POA: Insufficient documentation

## 2017-10-04 NOTE — Patient Instructions (Signed)
NO MEDICATIONS  CHANGES     SCHEDULE AT [address] Your physician has requested that you have en exercise stress myoview. For further information please visit [email or web address] Please follow instruction sheet, as given.    Your physician recommends that you schedule a follow-up appointment in 1 MONTH WITH DR HARDING.     Cardiac Nuclear Scan A cardiac nuclear scan is a test that measures blood flow to the heart when a person is resting and when he or she is exercising. The test looks for problems such as:  Not enough blood reaching a portion of the heart.  The heart muscle not working normally.  You may need this test if:  You have heart disease.  You have had abnormal lab results.  You have had heart surgery or angioplasty.  You have chest pain.  You have shortness of breath.  In this test, a radioactive dye (tracer) is injected into your bloodstream. After the tracer has traveled to your heart, an imaging device is used to measure how much of the tracer is absorbed by or distributed to various areas of your heart. This procedure is usually done at a hospital and takes 2-4 hours. Tell a health care provider about:  Any allergies you have.  All medicines you are taking, including vitamins, herbs, eye drops, creams, and over-the-counter medicines.  Any problems you or family members have had with the use of anesthetic medicines.  Any blood disorders you have.  Any surgeries you have had.  Any medical conditions you have.  Whether you are pregnant or may be pregnant. What are the risks? Generally, this is a safe procedure. However, problems may occur, including:  Serious chest pain and heart attack. This is only a risk if the stress portion of the test is done.  Rapid heartbeat.  Sensation of warmth in your chest. This usually passes quickly.  What happens before the procedure?  Ask your health care provider about changing or stopping  your regular medicines. This is especially important if you are taking diabetes medicines or blood thinners.  Remove your jewelry on the day of the procedure. What happens during the procedure?  An IV tube will be inserted into one of your veins.  Your health care provider will inject a small amount of radioactive tracer through the tube.  You will wait for 20-40 minutes while the tracer travels through your bloodstream.  Your heart activity will be monitored with an electrocardiogram (ECG).  You will lie down on an exam table.  Images of your heart will be taken for about 15-20 minutes.  You may be asked to exercise on a treadmill or stationary bike. While you exercise, your heart's activity will be monitored with an ECG, and your blood pressure will be checked. If you are unable to exercise, you may be given a medicine to increase blood flow to parts of your heart.  When blood flow to your heart has peaked, a tracer will again be injected through the IV tube.  After 20-40 minutes, you will get back on the exam table and have more images taken of your heart.  When the procedure is over, your IV tube will be removed. The procedure may vary among health care providers and hospitals. Depending on the type of tracer used, scans may need to be repeated 3-4 hours later. What happens after the procedure?  Unless your health care provider tells you otherwise, you may return to your normal schedule, including diet,  activities, and medicines.  Unless your health care provider tells you otherwise, you may increase your fluid intake. This will help flush the contrast dye from your body. Drink enough fluid to keep your urine clear or pale yellow.  It is up to you to get your test results. Ask your health care provider, or the department that is doing the test, when your results will be ready. Summary  A cardiac nuclear scan measures the blood flow to the heart when a person is resting and when  he or she is exercising.  You may need this test if you are at risk for heart disease.  Tell your health care provider if you are pregnant.  Unless your health care provider tells you otherwise, increase your fluid intake. This will help flush the contrast dye from your body. Drink enough fluid to keep your urine clear or pale yellow. This information is not intended to replace advice given to you by your health care provider. Make sure you discuss any questions you have with your health care provider. Document Released: 04/22/2004 Document Revised: 03/30/2016 Document Reviewed: 03/06/2013 Elsevier Interactive Patient Education  2017 [employer]

## 2017-10-04 NOTE — Progress Notes (Addendum)
PCP: Patient, No Pcp Per  Clinic Note: Chief Complaint  Patient presents with  . Hospitalization Follow-up    "accidental" OTC OD -- + Troponin  . Cardiomyopathy    EF 20-25% by Echo    HPI: Tim [name] is a 42 y.o. male with a PMH below who presents today for hospital follow-up. History of hypertension, asthma and polysubstance (cocaine, opiates, alcohol) abuse.  Tim [name] was recently hospitalized from June 14-15, 2018 unresponsive brief episode of CPR.  Was responsive to Narcan.  Is positive for cocaine and opiates.  EKG showed new T wave inversions ACS induced by cocaine.  Transthoracic echo revealed EF 25 to 30% with diffuse hypokinesis worse in the anterior and anterolateral wall. -->  He was started on 5 mg lisinopril.  Apparently there was a spell of 11 beats nonsustained V. Tach (NSVT).  Patient declined further inpatient evaluation and therefore was discharged with plans for outpatient follow-up. --He was seen in consultation by Dr. Johney Frame.  Elevated troponin (0.08 with flat trend) was thought to be related to demand ischemia from cocaine.  Other hospitalizations:  Multiple ER visits - most recently 6/14.  Studies Personally Reviewed - (if available, images/films reviewed: From Epic Chart or Care Everywhere)  Transthoracic Echo September 23, 2017: EF 20 to 25% with diffuse hypokinesis.  GR 1 DD.  Hypokinesis was more noted in the anterior and anterolateral wall  Interval History: Tim [name] has never had nor has he had since his hospitalization, any issues of chest tightness or pressure with rest or exertion.  He says that now that his blood pressure is been treated a lot of the shortness of breath and headache will issues he was noticing are better.  For the most part he is felt nauseated and not well when his blood pressure was high.  Basically since his hospitalization he has not had any issues with chest pain or pressure at rest or exertion.  No PND,  orthopnea. No palpitations, lightheadedness, dizziness, weakness or syncope/near syncope. No TIA/amaurosis fugax symptoms. No melena, hematochezia, hematuria, or epstaxis. No claudication.  ROS: A comprehensive was performed. Review of Systems  Constitutional: Negative for malaise/fatigue (Energy better since blood pressure control).  HENT: Negative for congestion and nosebleeds.   Respiratory: Negative for cough and shortness of breath.   Musculoskeletal: Negative for falls, joint pain and myalgias.  Endo/Heme/Allergies: Negative for environmental allergies.  Psychiatric/Behavioral: Positive for substance abuse (He apparently took a pain medicine that someone gave him and was mixed with alcohol. -This led to him passing out. ). Negative for depression and memory loss. The patient does not have insomnia.   All other systems reviewed and are negative.   I have reviewed and (if needed) personally updated the patient's problem list, medications, allergies, past medical and surgical history, social and family history.   Past Medical History:  Diagnosis Date  . Asthma   . Hypertension   . Noncompliance   . Polysubstance abuse [facility])     Past Surgical History:  Procedure Laterality Date  . TRANSTHORACIC ECHOCARDIOGRAM  09/23/2017   EF 20 to 25% with diffuse hypokinesis.  GR 1 DD.  Kinesis was more noted in the anterior and anterolateral wall  . WISDOM TOOTH EXTRACTION      Current Meds  Medication Sig  . amLODipine (NORVASC) 10 MG tablet Take 1 tablet (10 mg total) by mouth daily.  Marland Kitchen ibuprofen (ADVIL,MOTRIN) 800 MG tablet Take 1 tablet (800 mg total) by mouth 3 (  three) times daily.  Marland Kitchen lisinopril (PRINIVIL,ZESTRIL) 5 MG tablet Take 1 tablet (5 mg total) by mouth daily.    No Known Allergies  Social History   Tobacco Use  . Smoking status: Current Every Day Smoker    Packs/day: 1.00    Types: Cigarettes  . Smokeless tobacco: Never Used  Substance Use Topics  . Alcohol use:  Yes    Comment: occassionally  . Drug use: Yes    Types: Cocaine    Comment: UDS positive for cocaine and opiates   Social History   Social History Narrative   Previously incarcerated   Single with multiple children (~8).     Off & on Smoker - 17 yrs ~1/2 - 1 PPD   Currently unemployed - having a hard time finding a "good fit".   He currently lives with his brother & is working on his GED.   Admits to Cocaine (not Crack) use.    family history includes Hypertension in his mother.  Wt Readings from Last 3 Encounters:  10/04/17 230 lb (104.3 kg)  11/30/16 250 lb (113.4 kg)  02/29/16 260 lb (117.9 kg)    PHYSICAL EXAM BP 130/86   Pulse 80   Ht 6\' 1"  (1.854 m)   Wt 230 lb (104.3 kg)   SpO2 97%   BMI 30.34 kg/m  Physical Exam  Constitutional: He is oriented to person, place, and time. He appears well-developed. No distress.  Well-nourished, well-groomed.  HENT:  Head: Normocephalic and atraumatic.  Eyes: Pupils are equal, round, and reactive to light. EOM are normal. No scleral icterus.  Neck: Normal range of motion. Neck supple. No JVD present.  Pulmonary/Chest: Effort normal and breath sounds normal. No respiratory distress. He has no wheezes. He has no rales.  Abdominal: Soft. Bowel sounds are normal. He exhibits no distension. There is no tenderness. There is no rebound.  No HSM  Musculoskeletal: Normal range of motion.  Neurological: He is alert and oriented to person, place, and time. No cranial nerve deficit.  Skin: Skin is warm and dry. No erythema.  Psychiatric: He has a normal mood and affect. His behavior is normal. Judgment and thought content normal.     Adult ECG Report  Rate: 80 ;  Rhythm: normal sinus rhythm and LVH with repolarization changes. Inferolateral TWI no longer present;   Narrative Interpretation: relatively unrevealing.   Other studies Reviewed: Additional studies/ records that were reviewed today include:  Recent Labs:   Lab Results   Component Value Date   CREATININE 1.15 09/23/2017   BUN 13 09/23/2017   NA 141 09/23/2017   K 4.1 09/23/2017   CL 106 09/23/2017   CO2 28 09/23/2017    ASSESSMENT / PLAN: Problem List Items Addressed This Visit    Polysubstance abuse (HCC) (Chronic)    He tells me that he is done using cocaine and other drugs.  No plans to go back into using drugs.      OD (overdose of drug) (Chronic)   Relevant Orders   EKG 12-Lead (Completed)   MYOCARDIAL PERFUSION IMAGING   Essential hypertension (Chronic)    Blood pressure  pretty well controlled on current dose of amlodipine and lisinopril.  Pending ischemic evaluation, may need to adjust  treatment.      Elevated troponin (Chronic)    Elevated troponin in the setting of accidental OD with prolonged downtime. Seems to have recovered neurologically, but need to exclude ischemic CAD with a Myoview stress test.  This  will also allow us to reassess EF.      Relevant Orders   EKG 12-Lead (Completed)   MYOCARDIAL PERFUSION IMAGING   Dilated cardiomyopathy (HCC) - Primary    New diagnosis in the patient with major risk factor being hypertension and substance abuse.  With no active anginal symptoms, I do not necessarily need to Ischemic evaluation.  However, in order to potentially excluded this we will check a Myoview stress test.      Relevant Orders   EKG 12-Lead (Completed)   MYOCARDIAL PERFUSION IMAGING      I spent a total of 40 minutes with the patient and chart review. >  50% of the time was spent in direct patient consultation.   Current medicines are reviewed at length with the patient today.  (+/- concerns) n/a The following changes have been made:   See below - no changes.  Patient Instructions  NO MEDICATIONS  CHANGES     SCHEDULE AT [address] Your physician has requested that you have en exercise stress myoview. For further information please visit [email or web address]. Please follow instruction  sheet, as given.    Your physician recommends that you schedule a follow-up appointment in 1 MONTH WITH DR Williard Keller.   Studies Ordered:   Orders Placed This Encounter  Procedures  . MYOCARDIAL PERFUSION IMAGING  . EKG 12-Lead      Bryan Lemmaavid Muhammadali Ries, M.D., M.S. Interventional Cardiologist   Pager # [phone] Phone # [phone] [address] [place], [place] [address]   Thank you for choosing [facility]!!

## 2017-10-06 ENCOUNTER — Telehealth (HOSPITAL_COMMUNITY): Payer: Self-pay

## 2017-10-06 NOTE — Telephone Encounter (Signed)
I will attempt again at a later time.  Encounter complete. 

## 2017-10-08 ENCOUNTER — Encounter: Payer: Self-pay | Admitting: Cardiology

## 2017-10-08 NOTE — Assessment & Plan Note (Signed)
Elevated troponin in the setting of accidental OD with prolonged downtime. Seems to have recovered neurologically, but need to exclude ischemic CAD with a Myoview stress test.  This will also allow us to reassess EF.

## 2017-10-08 NOTE — Assessment & Plan Note (Signed)
He tells me that he is done using cocaine and other drugs.  No plans to go back into using drugs.

## 2017-10-08 NOTE — Assessment & Plan Note (Signed)
Blood pressure  pretty well controlled on current dose of amlodipine and lisinopril.  Pending ischemic evaluation, may need to adjust  treatment.

## 2017-10-08 NOTE — Assessment & Plan Note (Signed)
New diagnosis in the patient with major risk factor being hypertension and substance abuse.  With no active anginal symptoms, I do not necessarily need to Ischemic evaluation.  However, in order to potentially excluded this we will check a Myoview stress test.

## 2017-10-10 ENCOUNTER — Telehealth (HOSPITAL_COMMUNITY): Payer: Self-pay

## 2017-10-10 NOTE — Telephone Encounter (Signed)
Unable to reach patient. Encounter complete. 

## 2017-10-11 ENCOUNTER — Ambulatory Visit (HOSPITAL_COMMUNITY): Admission: RE | Admit: 2017-10-11 | Payer: MEDICAID | Source: Ambulatory Visit

## 2017-10-13 ENCOUNTER — Encounter (HOSPITAL_COMMUNITY): Payer: Self-pay | Admitting: *Deleted

## 2017-10-13 ENCOUNTER — Ambulatory Visit (INDEPENDENT_AMBULATORY_CARE_PROVIDER_SITE_OTHER): Payer: Self-pay

## 2017-10-13 ENCOUNTER — Other Ambulatory Visit: Payer: Self-pay

## 2017-10-13 ENCOUNTER — Ambulatory Visit (HOSPITAL_COMMUNITY)
Admission: EM | Admit: 2017-10-13 | Discharge: 2017-10-13 | Disposition: A | Discharge status: Home / Self Care | Payer: Self-pay | Attending: Family Medicine | Admitting: Family Medicine

## 2017-10-13 DIAGNOSIS — M79674 Pain in right toe(s): Secondary | ICD-10-CM

## 2017-10-13 DIAGNOSIS — W2209XA Striking against other stationary object, initial encounter: Secondary | ICD-10-CM

## 2017-10-13 DIAGNOSIS — S99921A Unspecified injury of right foot, initial encounter: Secondary | ICD-10-CM

## 2017-10-13 MED ORDER — IBUPROFEN 800 MG PO TABS
800.0000 mg | ORAL_TABLET | Freq: Once | ORAL | Status: AC
  Administered 2017-10-13: 800 mg via ORAL

## 2017-10-13 MED ORDER — MELOXICAM 7.5 MG PO TABS: 7.5000 mg | ORAL_TABLET | Freq: Every day | ORAL | 0 refills | Status: DC

## 2017-10-13 MED ORDER — IBUPROFEN 800 MG PO TABS
ORAL_TABLET | ORAL | Status: AC
  Filled 2017-10-13: qty 1

## 2017-10-13 NOTE — ED Provider Notes (Signed)
[facility]    CSN: [id number] Arrival date & time: 10/13/17  1306     History   Chief Complaint Chief Complaint  Patient presents with  . Toe Injury    HPI MILFORD CILENTO is a 42 y.o. male.   42 year old male with history of asthma, HTN, polysubstance abuse, dilated cardiomyopathy comes in for 2-day history of right second toe pain after injury.  States he jammed the toe while moving furniture yesterday.  Swelling, contusion to the second toe.  States tried to put a towel between the toes to help keep the toes apart.  Denies numbness, tingling.  Has not taken anything for the symptoms.     Past Medical History:  Diagnosis Date  . Asthma   . Hypertension   . Noncompliance   . Polysubstance abuse [facility])     Patient Active Problem List   Diagnosis Date Noted  . Dilated cardiomyopathy (HCC) 10/04/2017  . Polysubstance abuse (HCC) 10/04/2017  . OD (overdose of drug) 09/22/2017  . Elevated troponin 09/22/2017  . Essential hypertension     Past Surgical History:  Procedure Laterality Date  . TRANSTHORACIC ECHOCARDIOGRAM  09/23/2017   EF 20 to 25% with diffuse hypokinesis.  GR 1 DD.  Kinesis was more noted in the anterior and anterolateral wall  . WISDOM TOOTH EXTRACTION         Home Medications    Prior to Admission medications   Medication Sig Start Date End Date Taking? Authorizing Provider  amLODipine (NORVASC) 10 MG tablet Take 1 tablet (10 mg total) by mouth daily. 08/31/17 10/04/17  Wieters, Hallie C, PA-C  lisinopril (PRINIVIL,ZESTRIL) 5 MG tablet Take 1 tablet (5 mg total) by mouth daily. 09/23/17 10/23/17  Lenox Ponds, MD  meloxicam (MOBIC) 7.5 MG tablet Take 1 tablet (7.5 mg total) by mouth daily. 10/13/17   Belinda Fisher, PA-C    Family History Family History  Problem Relation Age of Onset  . Hypertension Mother     Social History Social History   Tobacco Use  . Smoking status: Current Every Day Smoker    Packs/day: 1.00    Types:  Cigarettes  . Smokeless tobacco: Never Used  Substance Use Topics  . Alcohol use: Not Currently    Comment: occassionally  . Drug use: Not Currently    Types: Cocaine    Comment: UDS positive for cocaine and opiates     Allergies   Patient has no known allergies.   Review of Systems Review of Systems  Reason unable to perform ROS: See HPI as above.     Physical Exam Triage Vital Signs ED Triage Vitals  Enc Vitals Group     BP 10/13/17 1413 (!) 150/102     Pulse Rate 10/13/17 1413 80     Resp 10/13/17 1413 18     Temp 10/13/17 1413 98 F (36.7 C)     Temp Source 10/13/17 1413 Oral     SpO2 10/13/17 1413 100 %     Weight --      Height --      Head Circumference --      Peak Flow --      Pain Score 10/13/17 1414 8     Pain Loc --      Pain Edu? --      Excl. in GC? --    No data found.  Updated Vital Signs BP (!) 150/102 (BP Location: Right Arm)   Pulse 80  Temp 98 F (36.7 C) (Oral)   Resp 18   SpO2 100%   Physical Exam  Constitutional: He is oriented to person, place, and time. He appears well-developed and well-nourished. No distress.  HENT:  Head: Normocephalic and atraumatic.  Eyes: Pupils are equal, round, and reactive to light. Conjunctivae are normal.  Musculoskeletal:  Swelling, contusion to the right 2nd toe. Tenderness to palpation along the toe. Unable to move toe. Sensation intact. Pedal pulse 2+ and equal bilaterally. Cap refill <2s  Neurological: He is alert and oriented to person, place, and time.  Skin: He is not diaphoretic.     UC Treatments / Results  Labs (all labs ordered are listed, but only abnormal results are displayed) Labs Reviewed - No data to display  EKG None  Radiology Dg Foot Complete Right  Result Date: 10/13/2017 CLINICAL DATA:  Trauma to the great toe yesterday with phalangeal pain and swelling. EXAM: RIGHT FOOT COMPLETE - 3+ VIEW COMPARISON:  None. FINDINGS: Hallux valgus deformity of the metatarsal  phalangeal joint region of the great toe. No evidence of fracture or dislocation. IMPRESSION: No acute or traumatic finding.  Hallux valgus deformity. Electronically Signed   By: Paulina FusiMark  Shogry M.D.   On: 10/13/2017 15:17    Procedures Procedures (including critical care time)  Medications Ordered in UC Medications - No data to display  Initial Impression / Assessment and Plan / UC Course  I have reviewed the triage vital signs and the nursing notes.  Pertinent labs & imaging results that were available during my care of the patient were reviewed by me and considered in my medical decision making (see chart for details).    X-ray negative for fracture or dislocation.  NSAIDs, ice compress, elevation, post boot during activity.  Return precautions given.  Final Clinical Impressions(s) / UC Diagnoses   Final diagnoses:  Injury of toe on right foot, initial encounter    ED Prescriptions    Medication Sig Dispense Auth. Provider   meloxicam (MOBIC) 7.5 MG tablet Take 1 tablet (7.5 mg total) by mouth daily. 15 tablet Threasa AlphaYu, Neeko Pharo V, PA-C        Chanel Mckesson V, [place]PA-C 10/13/17 1527

## 2017-10-13 NOTE — Discharge Instructions (Signed)
X-ray negative for fracture or dislocation. Start Mobic. Do not take ibuprofen (motrin/advil)/ naproxen (aleve) while on mobic.  Ice compress, elevation, postop boot during activity.  This can take a few weeks to completely resolve, but should be feeling better each week.  Follow-up with PCP or orthopedics for further evaluation if symptoms not improving.

## 2017-10-13 NOTE — ED Triage Notes (Signed)
States he was moving a couch and hit his right great toe on the couch

## 2017-10-17 ENCOUNTER — Telehealth (HOSPITAL_COMMUNITY): Payer: Self-pay

## 2017-10-17 NOTE — Telephone Encounter (Signed)
Encounter complete. 

## 2017-10-18 ENCOUNTER — Ambulatory Visit (HOSPITAL_COMMUNITY): Admit: 2017-10-18 | Payer: Self-pay | Attending: Cardiology | Admitting: Cardiology

## 2017-10-18 ENCOUNTER — Encounter (HOSPITAL_COMMUNITY): Payer: Self-pay | Admitting: *Deleted

## 2017-10-18 ENCOUNTER — Ambulatory Visit (HOSPITAL_COMMUNITY)
Admission: EM | Admit: 2017-10-18 | Discharge: 2017-10-18 | Disposition: A | Discharge status: Home / Self Care | Payer: Self-pay | Attending: Family Medicine | Admitting: Family Medicine

## 2017-10-18 DIAGNOSIS — J029 Acute pharyngitis, unspecified: Secondary | ICD-10-CM | POA: Insufficient documentation

## 2017-10-18 DIAGNOSIS — J45909 Unspecified asthma, uncomplicated: Secondary | ICD-10-CM | POA: Insufficient documentation

## 2017-10-18 DIAGNOSIS — I10 Essential (primary) hypertension: Secondary | ICD-10-CM | POA: Insufficient documentation

## 2017-10-18 DIAGNOSIS — F17210 Nicotine dependence, cigarettes, uncomplicated: Secondary | ICD-10-CM | POA: Insufficient documentation

## 2017-10-18 DIAGNOSIS — R509 Fever, unspecified: Secondary | ICD-10-CM

## 2017-10-18 LAB — POCT RAPID STREP A: Streptococcus, Group A Screen (Direct): NEGATIVE

## 2017-10-18 MED ORDER — AMOXICILLIN 875 MG PO TABS: 875.0000 mg | ORAL_TABLET | Freq: Two times a day (BID) | ORAL | 0 refills | Status: AC

## 2017-10-18 MED ORDER — KETOROLAC TROMETHAMINE 60 MG/2ML IM SOLN
60.0000 mg | Freq: Once | INTRAMUSCULAR | Status: AC
  Administered 2017-10-18: 60 mg via INTRAMUSCULAR

## 2017-10-18 MED ORDER — AMLODIPINE BESYLATE 10 MG PO TABS: 10.0000 mg | ORAL_TABLET | Freq: Every day | ORAL | 2 refills | Status: DC

## 2017-10-18 MED ORDER — LISINOPRIL 5 MG PO TABS: 5.0000 mg | ORAL_TABLET | Freq: Every day | ORAL | 2 refills | Status: DC

## 2017-10-18 MED ORDER — KETOROLAC TROMETHAMINE 60 MG/2ML IM SOLN
INTRAMUSCULAR | Status: AC
  Filled 2017-10-18: qty 2

## 2017-10-18 MED ORDER — ACETAMINOPHEN 325 MG PO TABS
ORAL_TABLET | ORAL | Status: AC
Start: 2017-10-18 — End: ?
  Filled 2017-10-18: qty 2

## 2017-10-18 MED ORDER — ACETAMINOPHEN 325 MG PO TABS
650.0000 mg | ORAL_TABLET | Freq: Once | ORAL | Status: AC
  Administered 2017-10-18: 650 mg via ORAL

## 2017-10-18 NOTE — Discharge Instructions (Signed)
You may use over the counter ibuprofen or acetaminophen as needed.  For a sore throat, over the counter products such as [employer] Peroxyl Mouth Sore Rinse or Chloraseptic Sore Throat Spray may provide some temporary relief. Your rapid strep test was negative today. We have sent your throat swab for culture and will let you know of any positive results. Given how your throat looks and with associated fever, I have prescribed an antibiotic for you to begin today.

## 2017-10-18 NOTE — ED Provider Notes (Signed)
[facility]   [id number] 10/18/17 Arrival Time: 1127  ASSESSMENT & PLAN:  1. Fever and chills   2. Sore throat   3. Essential hypertension     Meds ordered this encounter  Medications  . acetaminophen (TYLENOL) tablet 650 mg  . ketorolac (TORADOL) injection 60 mg  . amLODipine (NORVASC) 10 MG tablet    Sig: Take 1 tablet (10 mg total) by mouth daily.    Dispense:  30 tablet    Refill:  2  . lisinopril (PRINIVIL,ZESTRIL) 5 MG tablet    Sig: Take 1 tablet (5 mg total) by mouth daily.    Dispense:  30 tablet    Refill:  2  . amoxicillin (AMOXIL) 875 MG tablet    Sig: Take 1 tablet (875 mg total) by mouth 2 (two) times daily for 10 days.    Dispense:  20 tablet    Refill:  0    Results for orders placed or performed during the hospital encounter of 10/18/17  POCT rapid strep A [facility])  Result Value Ref Range   Streptococcus, Group A Screen (Direct) NEGATIVE NEGATIVE   Labs Reviewed  CULTURE, GROUP A STREP [facility])  POCT RAPID STREP A   OTC analgesics and throat care as needed  Instructed to finish full 10 day course of antibiotics. Will follow up if not showing significant improvement over the next 24-48 hours.    Discharge Instructions      You may use over the counter ibuprofen or acetaminophen as needed.   For a sore throat, over the counter products such as [employer] Peroxyl Mouth Sore Rinse or Chloraseptic Sore Throat Spray may provide some temporary relief.  Your rapid strep test was negative today. We have sent your throat swab for culture and will let you know of any positive results. Given how your throat looks and with associated fever, I have prescribed an antibiotic for you to begin today.   Reviewed expectations re: course of current medical issues. Questions answered. Outlined signs and symptoms indicating need for more acute intervention. Patient verbalized understanding. After Visit Summary given.   SUBJECTIVE:  Tim [name] is a 42 y.o. male who reports a sore throat. Describes as "very sore". Onset abrupt beginning 1 day ago. No respiratory symptoms. Normal PO intake but reports discomfort with swallowing. Fever reported: yes, subjective. No associated n/v/abdominal symptoms. Sick contacts: none reported.  OTC treatment: None.  ROS: As per HPI.   OBJECTIVE:  Vitals:   10/18/17 1208  BP: 137/76  Pulse: (!) 120  Resp: 17  Temp: (!) 101.5 F (38.6 C)  TempSrc: Oral  SpO2: 98%    Fever and tachycardia noted.  General appearance: alert; no distress HEENT: throat with tonsillar hypertrophy, moderate erythema and exudates present; uvula midline yes Neck: supple with FROM; small cervical LAD bilaterally Lungs: clear to auscultation bilaterally Skin: reveals no rash; warm and dry Psychological: alert and cooperative; normal mood and affect  No Known Allergies  Past Medical History:  Diagnosis Date  . Asthma   . Hypertension   . Noncompliance   . Polysubstance abuse (HCC)    Social History   Socioeconomic History  . Marital status: Single    Spouse name: Not on file  . Number of children: Not on file  . Years of education: Not on file  . Highest education level: Not on file  Occupational History  . Not on file  Social Needs  . Financial resource strain: Not  on file  . Food insecurity:    Worry: Not on file    Inability: Not on file  . Transportation needs:    Medical: Not on file    Non-medical: Not on file  Tobacco Use  . Smoking status: Current Every Day Smoker    Packs/day: 1.00    Types: Cigarettes  . Smokeless tobacco: Never Used  Substance and Sexual Activity  . Alcohol use: Not Currently    Comment: occassionally  . Drug use: Not Currently    Types: Cocaine    Comment: UDS positive for cocaine and opiates  . Sexual activity: Yes  Lifestyle  . Physical activity:    Days per week: Not on file    Minutes per session: Not on file  . Stress: Not on file    Relationships  . Social connections:    Talks on phone: Not on file    Gets together: Not on file    Attends religious service: Not on file    Active member of club or organization: Not on file    Attends meetings of clubs or organizations: Not on file    Relationship status: Not on file  . Intimate partner violence:    Fear of current or ex partner: Not on file    Emotionally abused: Not on file    Physically abused: Not on file    Forced sexual activity: Not on file  Other Topics Concern  . Not on file  Social History Narrative   Previously incarcerated   Single with multiple children (~8).     Off & on Smoker - 19 yrs ~1/2 - 1 PPD   Currently unemployed - having a hard time finding a "good fit".   He currently lives with his brother & is working on his GED.   Admits to Cocaine (not Crack) use.   Family History  Problem Relation Age of Onset  . Hypertension Mother           Mardella LaymanHagler, Meela Wareing, MD 10/18/17 [phone]

## 2017-10-18 NOTE — ED Triage Notes (Addendum)
Patient reports sore throat x 2 days with fever and body aches.   Patient also reports running out of his blood pressure medication, states he felt dizzy this am. Patient takes amlodipine and lisinopril, meds are correct in med history.

## 2017-10-20 LAB — CULTURE, GROUP A STREP (THRC)

## 2017-10-23 ENCOUNTER — Telehealth (HOSPITAL_COMMUNITY): Payer: Self-pay

## 2017-10-23 NOTE — Telephone Encounter (Signed)
Culture is positive for non group A Strep germ.  This is a finding of uncertain significance; not the typical 'strep throat' germ.  Attempted to reach patient. No answer at this time.  

## 2017-10-30 ENCOUNTER — Encounter (HOSPITAL_COMMUNITY): Payer: Self-pay | Admitting: Cardiology

## 2017-11-03 ENCOUNTER — Ambulatory Visit: Payer: Self-pay | Admitting: Cardiology

## 2017-11-06 ENCOUNTER — Encounter: Payer: Self-pay | Admitting: *Deleted

## 2017-11-28 ENCOUNTER — Encounter: Payer: Self-pay | Admitting: Cardiology

## 2018-01-01 ENCOUNTER — Encounter (HOSPITAL_COMMUNITY): Payer: Self-pay | Admitting: Emergency Medicine

## 2018-01-01 ENCOUNTER — Ambulatory Visit (HOSPITAL_COMMUNITY)
Admission: EM | Admit: 2018-01-01 | Discharge: 2018-01-01 | Disposition: A | Discharge status: Home / Self Care | Payer: Self-pay | Attending: Family Medicine | Admitting: Family Medicine

## 2018-01-01 DIAGNOSIS — K0889 Other specified disorders of teeth and supporting structures: Secondary | ICD-10-CM

## 2018-01-01 MED ORDER — DICLOFENAC SODIUM 75 MG PO TBEC: 75.0000 mg | DELAYED_RELEASE_TABLET | Freq: Two times a day (BID) | ORAL | 0 refills | Status: DC

## 2018-01-01 MED ORDER — KETOROLAC TROMETHAMINE 60 MG/2ML IM SOLN
60.0000 mg | Freq: Once | INTRAMUSCULAR | Status: AC
  Administered 2018-01-01: 60 mg via INTRAMUSCULAR

## 2018-01-01 MED ORDER — AMOXICILLIN 875 MG PO TABS: 875.0000 mg | ORAL_TABLET | Freq: Two times a day (BID) | ORAL | 0 refills | Status: AC

## 2018-01-01 MED ORDER — KETOROLAC TROMETHAMINE 60 MG/2ML IM SOLN
INTRAMUSCULAR | Status: AC
  Filled 2018-01-01: qty 2

## 2018-01-01 NOTE — ED Provider Notes (Signed)
[facility]    CSN: [id number] Arrival date & time: 01/01/18  1241     History   Chief Complaint Chief Complaint  Patient presents with  . Dental Pain    HPI Tim [name] is a 42 y.o. male.   Don presents with complaints of pain to right lower wisdom tooth which started 3 days ago.caused neighboring tooth to break off. This irritates his tongue and causes it to be cut. Pain with eating, states has been unable to eat due to pain. No facial swelling. No drainage or foul taste. No known fevers. States took advil yesterday which did not help. Does not have a dentist. Hx of asthma, htn, polysubstance abuse. Pain is severe.   ROS per HPI.      Past Medical History:  Diagnosis Date  . Asthma   . Hypertension   . Noncompliance   . Polysubstance abuse [facility])     Patient Active Problem List   Diagnosis Date Noted  . Dilated cardiomyopathy (HCC) 10/04/2017  . Polysubstance abuse (HCC) 10/04/2017  . OD (overdose of drug) 09/22/2017  . Elevated troponin 09/22/2017  . Essential hypertension     Past Surgical History:  Procedure Laterality Date  . TRANSTHORACIC ECHOCARDIOGRAM  09/23/2017   EF 20 to 25% with diffuse hypokinesis.  GR 1 DD.  Kinesis was more noted in the anterior and anterolateral wall  . WISDOM TOOTH EXTRACTION         Home Medications    Prior to Admission medications   Medication Sig Start Date End Date Taking? Authorizing Provider  amLODipine (NORVASC) 10 MG tablet Take 1 tablet (10 mg total) by mouth daily. 10/18/17   Mardella Layman, MD  amoxicillin (AMOXIL) 875 MG tablet Take 1 tablet (875 mg total) by mouth 2 (two) times daily for 7 days. 01/01/18 01/08/18  Georgetta Haber, NP  diclofenac (VOLTAREN) 75 MG EC tablet Take 1 tablet (75 mg total) by mouth 2 (two) times daily. 01/01/18   Georgetta Haber, NP  lisinopril (PRINIVIL,ZESTRIL) 5 MG tablet Take 1 tablet (5 mg total) by mouth daily. 10/18/17 11/17/17  Mardella Layman, MD    Family  History Family History  Problem Relation Age of Onset  . Hypertension Mother     Social History Social History   Tobacco Use  . Smoking status: Current Every Day Smoker    Packs/day: 1.00    Types: Cigarettes  . Smokeless tobacco: Never Used  Substance Use Topics  . Alcohol use: Not Currently    Comment: occassionally  . Drug use: Not Currently    Types: Cocaine    Comment: UDS positive for cocaine and opiates     Allergies   Patient has no known allergies.   Review of Systems Review of Systems   Physical Exam Triage Vital Signs ED Triage Vitals [01/01/18 1325]  Enc Vitals Group     BP      Pulse Rate 82     Resp 16     Temp 98.4 F (36.9 C)     Temp src      SpO2 97 %     Weight      Height      Head Circumference      Peak Flow      Pain Score      Pain Loc      Pain Edu?      Excl. in GC?    No data found.  Updated Vital Signs  Pulse 82   Temp 98.4 F (36.9 C)   Resp 16   SpO2 97%   Physical Exam  Constitutional: He is oriented to person, place, and time. He appears well-developed and well-nourished.  HENT:  Right lower wisdom tooth growing in at angle noted with neighboring tooth broken off; no visible abscess presence; mild underlying jaw tenderness.   Cardiovascular: Normal rate and regular rhythm.  Pulmonary/Chest: Effort normal and breath sounds normal.  Neurological: He is alert and oriented to person, place, and time.  Skin: Skin is warm and dry.     UC Treatments / Results  Labs (all labs ordered are listed, but only abnormal results are displayed) Labs Reviewed - No data to display  EKG None  Radiology No results found.  Procedures Procedures (including critical care time)  Medications Ordered in UC Medications  ketorolac (TORADOL) injection 60 mg (60 mg Intramuscular Given 01/01/18 1347)    Initial Impression / Assessment and Plan / UC Course  I have reviewed the triage vital signs and the nursing  notes.  Pertinent labs & imaging results that were available during my care of the patient were reviewed by me and considered in my medical decision making (see chart for details).     toradol provided in clinic today per patient request with voltaren twice a day as needed for pain. Amoxicillin course. To follow up with dentist for definitive treatment.   Final Clinical Impressions(s) / UC Diagnoses   Final diagnoses:  Pain, dental     Discharge Instructions     Diclofenac twice a day day for pain, may start this tonight. Don't take additional ibuprofen.  Complete course of antibiotics.   Please follow up with dentist for definitive treatment.     ED Prescriptions    Medication Sig Dispense Auth. Provider   diclofenac (VOLTAREN) 75 MG EC tablet Take 1 tablet (75 mg total) by mouth 2 (two) times daily. 20 tablet Linus MakoBurky, Ledonna Dormer B, NP   amoxicillin (AMOXIL) 875 MG tablet Take 1 tablet (875 mg total) by mouth 2 (two) times daily for 7 days. 14 tablet Georgetta HaberBurky, Nihira Puello B, NP     Controlled Substance Prescriptions [place] Controlled Substance Registry consulted? Not Applicable   Georgetta HaberBurky, Crespin Forstrom B, NP 01/01/18 1353

## 2018-01-01 NOTE — Discharge Instructions (Signed)
Diclofenac twice a day day for pain, may start this tonight. Don't take additional ibuprofen.  Complete course of antibiotics.   Please follow up with dentist for definitive treatment.

## 2018-01-01 NOTE — ED Triage Notes (Addendum)
Pt c/o wisdom teeth coming out on the R side, c/o dental pain. Pt states hes been out of his BP meds x1 month.

## 2018-03-21 ENCOUNTER — Encounter (HOSPITAL_BASED_OUTPATIENT_CLINIC_OR_DEPARTMENT_OTHER): Payer: Self-pay | Admitting: Emergency Medicine

## 2018-03-21 ENCOUNTER — Emergency Department (HOSPITAL_BASED_OUTPATIENT_CLINIC_OR_DEPARTMENT_OTHER)
Admission: EM | Admit: 2018-03-21 | Discharge: 2018-03-21 | Disposition: A | Discharge status: Home / Self Care | Payer: Self-pay | Attending: Emergency Medicine | Admitting: Emergency Medicine

## 2018-03-21 ENCOUNTER — Other Ambulatory Visit: Payer: Self-pay

## 2018-03-21 DIAGNOSIS — I10 Essential (primary) hypertension: Secondary | ICD-10-CM | POA: Insufficient documentation

## 2018-03-21 DIAGNOSIS — K0889 Other specified disorders of teeth and supporting structures: Secondary | ICD-10-CM | POA: Insufficient documentation

## 2018-03-21 DIAGNOSIS — Z79899 Other long term (current) drug therapy: Secondary | ICD-10-CM | POA: Insufficient documentation

## 2018-03-21 DIAGNOSIS — J45909 Unspecified asthma, uncomplicated: Secondary | ICD-10-CM | POA: Insufficient documentation

## 2018-03-21 DIAGNOSIS — F17210 Nicotine dependence, cigarettes, uncomplicated: Secondary | ICD-10-CM | POA: Insufficient documentation

## 2018-03-21 MED ORDER — KETOROLAC TROMETHAMINE 15 MG/ML IJ SOLN
15.0000 mg | Freq: Once | INTRAMUSCULAR | Status: AC
  Administered 2018-03-21: 15 mg via INTRAMUSCULAR
  Filled 2018-03-21: qty 1

## 2018-03-21 MED ORDER — AMOXICILLIN 875 MG PO TABS: 875.0000 mg | ORAL_TABLET | Freq: Two times a day (BID) | ORAL | 0 refills | Status: DC

## 2018-03-21 MED ORDER — DICLOFENAC SODIUM 75 MG PO TBEC: 75.0000 mg | DELAYED_RELEASE_TABLET | Freq: Two times a day (BID) | ORAL | 0 refills | Status: DC

## 2018-03-21 MED FILL — DICLOFENAC SOD EC 75 MG TAB: 75 | 10 days supply | Qty: 20 | Fill #0

## 2018-03-21 MED FILL — AMOXICILLIN 875 MG TABS: 875 | 7 days supply | Qty: 14 | Fill #0

## 2018-03-21 NOTE — ED Triage Notes (Signed)
Patient reports his right lower wisdom tooth broke approximately 1 week ago and he now has pain.

## 2018-03-21 NOTE — ED Provider Notes (Signed)
[facility] EMERGENCY DEPARTMENT Provider Note   CSN: [id number] Arrival date & time: 03/21/18  0944     History   Chief Complaint Chief Complaint  Patient presents with  . Dental Pain    HPI Tim [name] is a 42 y.o. male presenting for evaluation of dental pain.  Patient states of the past 2 days, he has been having worsening right-sided dental pain.  Patient states on the bottom, his wisdom tooth is growing into his tooth in front of it, causing it to crack.  Additionally, he is having some pain of his right upper tooth as well.  Pain is been constant, unrelieved with Tylenol or aspirin.  Last dose was at 4:00 this morning.  He denies fevers, chills, difficulty swallowing, nausea, vomiting, abdominal pain.  He has a history of hypertension, but is not currently on medicine, as last time his blood pressure was checked it was normal.  He takes no medications daily.  He is immunocompetent.  Patient states he was seen for the same several months ago, symptoms completely resolved.  Additional history obtained from chart review, patient was seen at urgent care 3 months ago, given a Toradol shot, diclofenac, and Amoxil.  HPI  Past Medical History:  Diagnosis Date  . Asthma   . Hypertension   . Noncompliance   . Polysubstance abuse [facility](HCC)     Patient Active Problem List   Diagnosis Date Noted  . Dilated cardiomyopathy (HCC) 10/04/2017  . Polysubstance abuse (HCC) 10/04/2017  . OD (overdose of drug) 09/22/2017  . Elevated troponin 09/22/2017  . Essential hypertension     Past Surgical History:  Procedure Laterality Date  . TRANSTHORACIC ECHOCARDIOGRAM  09/23/2017   EF 20 to 25% with diffuse hypokinesis.  GR 1 DD.  Kinesis was more noted in the anterior and anterolateral wall  . WISDOM TOOTH EXTRACTION          Home Medications    Prior to Admission medications   Medication Sig Start Date End Date Taking? Authorizing Provider  amLODipine (NORVASC) 10 MG  tablet Take 1 tablet (10 mg total) by mouth daily. 10/18/17   Mardella LaymanHagler, Brian, MD  amoxicillin (AMOXIL) 875 MG tablet Take 1 tablet (875 mg total) by mouth 2 (two) times daily. 03/21/18   Ishaq Maffei, PA-C  diclofenac (VOLTAREN) 75 MG EC tablet Take 1 tablet (75 mg total) by mouth 2 (two) times daily. 03/21/18   Rielly Brunn, PA-C  lisinopril (PRINIVIL,ZESTRIL) 5 MG tablet Take 1 tablet (5 mg total) by mouth daily. 10/18/17 11/17/17  Mardella LaymanHagler, Brian, MD    Family History Family History  Problem Relation Age of Onset  . Hypertension Mother     Social History Social History   Tobacco Use  . Smoking status: Current Every Day Smoker    Packs/day: 1.00    Types: Cigarettes  . Smokeless tobacco: Never Used  Substance Use Topics  . Alcohol use: Not Currently    Comment: occassionally  . Drug use: Not Currently    Types: Cocaine    Comment: UDS positive for cocaine and opiates     Allergies   Patient has no known allergies.   Review of Systems Review of Systems  Constitutional: Negative for fever.  HENT: Positive for dental problem.      Physical Exam Updated Vital Signs BP (!) 151/103 (BP Location: Left Arm)   Pulse 72   Temp 98 F (36.7 C) (Oral)   Resp 16   Ht 6\' 1"  (  1.854 m)   Wt 108.9 kg   SpO2 98%   BMI 31.66 kg/m   Physical Exam  Constitutional: He is oriented to person, place, and time. He appears well-developed and well-nourished. No distress.  HENT:  Head: Normocephalic and atraumatic.  Mouth/Throat: Uvula is midline, oropharynx is clear and moist and mucous membranes are normal. Abnormal dentition.    Patient with a cracked tooth on the right lower side, is being hit by the wisdom tooth.  This tooth is very tender to palpation.  No pain under the tongue.  No facial swelling.  No trismus.  Eyes: EOM are normal.  Neck: Normal range of motion.  Cardiovascular: Normal rate, regular rhythm and intact distal pulses.  Pulmonary/Chest: Effort normal and  breath sounds normal. No respiratory distress. He has no wheezes.  Abdominal: He exhibits no distension.  Musculoskeletal: Normal range of motion.  Neurological: He is alert and oriented to person, place, and time.  Skin: Skin is warm. No rash noted.  Psychiatric: He has a normal mood and affect.  Nursing note and vitals reviewed.    ED Treatments / Results  Labs (all labs ordered are listed, but only abnormal results are displayed) Labs Reviewed - No data to display  EKG None  Radiology No results found.  Procedures Procedures (including critical care time)  Medications Ordered in ED Medications  ketorolac (TORADOL) 15 MG/ML injection 15 mg (has no administration in time range)     Initial Impression / Assessment and Plan / ED Course  I have reviewed the triage vital signs and the nursing notes.  Pertinent labs & imaging results that were available during my care of the patient were reviewed by me and considered in my medical decision making (see chart for details).     Presenting for evaluation of worsening dental pain over the past 3 days.  Physical examination, he is afebrile not tachycardic.  Appears nontoxic.  No signs of Ludwig's.  Patient with a cracked tooth, and worsening pain.  As such, concern for early infection, although no obvious sign of infection at this time.  Patient's symptoms were managed well last time with course of diclofenac and antibiotics.  Will prescribe the same.  Encourage patient to follow-up with dentistry, and discussed that if he does not, symptoms will likely continue/recur.  At this time, patient appears safe for discharge.  Return precautions given.  Patient states he understands and agrees plan.   Final Clinical Impressions(s) / ED Diagnoses   Final diagnoses:  Pain, dental    ED Discharge Orders         Ordered    diclofenac (VOLTAREN) 75 MG EC tablet  2 times daily     03/21/18 1055    amoxicillin (AMOXIL) 875 MG tablet  2  times daily     03/21/18 1055           Brynja Marker, PA-C 03/21/18 1102    Little, Ambrose Finland, MD 03/22/18 1320

## 2018-03-21 NOTE — Discharge Instructions (Addendum)
Take antibiotics as prescribed.  Take the entire course, even if your symptoms improve. Take diclofenac 2 times a day with meals as needed for pain.  Do not take other anti-inflammatories at the same time (Advil, ibuprofen, Motrin, naproxen, Aleve). You may supplement with Tylenol if you need further pain control. It is important that you follow-up with a dentist for further management of your tooth.  There is information about dentists in the area in the paperwork. Return to the emergency room with any new, worsening, concerning symptoms.

## 2018-04-16 ENCOUNTER — Ambulatory Visit (HOSPITAL_COMMUNITY)
Admission: EM | Admit: 2018-04-16 | Discharge: 2018-04-16 | Disposition: A | Discharge status: Home / Self Care | Payer: Self-pay | Attending: Internal Medicine | Admitting: Internal Medicine

## 2018-04-16 ENCOUNTER — Encounter (HOSPITAL_COMMUNITY): Payer: Self-pay | Admitting: Emergency Medicine

## 2018-04-16 DIAGNOSIS — R21 Rash and other nonspecific skin eruption: Secondary | ICD-10-CM | POA: Insufficient documentation

## 2018-04-16 MED ORDER — PREDNISONE 50 MG PO TABS: ORAL_TABLET | ORAL | 0 refills | Status: DC

## 2018-04-16 MED ORDER — ACYCLOVIR 400 MG PO TABS: 800.0000 mg | ORAL_TABLET | Freq: Every day | ORAL | 0 refills | Status: AC

## 2018-04-16 MED ORDER — VALACYCLOVIR HCL 1 G PO TABS: 1000.0000 mg | ORAL_TABLET | Freq: Three times a day (TID) | ORAL | 0 refills | Status: DC

## 2018-04-16 MED ORDER — KETOROLAC TROMETHAMINE 60 MG/2ML IM SOLN
60.0000 mg | Freq: Once | INTRAMUSCULAR | Status: AC
  Administered 2018-04-16: 60 mg via INTRAMUSCULAR

## 2018-04-16 MED ORDER — KETOROLAC TROMETHAMINE 60 MG/2ML IM SOLN
INTRAMUSCULAR | Status: AC
  Filled 2018-04-16: qty 2

## 2018-04-16 NOTE — ED Triage Notes (Signed)
Pt sts right foot pain with rash and right hip pain

## 2018-04-16 NOTE — ED Provider Notes (Signed)
[facility]    CSN: [id number] Arrival date & time: 04/16/18  0946     History   Chief Complaint Chief Complaint  Patient presents with  . Rash    HPI Tim [name] is a 43 y.o. male that presents.   Patient is a 43 year old male that presents with worsening rash over the past 3 days.  The rash is located to the left foot extending into the ankle.  He describes the rash as burning and painful.  The rash is not pruritic.  He is also having some left lower back pain radiating to buttocks area.  Denies any associated numbness, tingling.  Denies any fever. Denies any recent changes in lotions, detergents, foods or other possible irritants. No recent travel. Nobody else at home has the rash. Patient has been outside but denies any contact with plants or insects. No new foods or medications.    ROS per HPI       Past Medical History:  Diagnosis Date  . Asthma   . Hypertension   . Noncompliance   . Polysubstance abuse [facility])     Patient Active Problem List   Diagnosis Date Noted  . Dilated cardiomyopathy (HCC) 10/04/2017  . Polysubstance abuse (HCC) 10/04/2017  . OD (overdose of drug) 09/22/2017  . Elevated troponin 09/22/2017  . Essential hypertension     Past Surgical History:  Procedure Laterality Date  . TRANSTHORACIC ECHOCARDIOGRAM  09/23/2017   EF 20 to 25% with diffuse hypokinesis.  GR 1 DD.  Kinesis was more noted in the anterior and anterolateral wall  . WISDOM TOOTH EXTRACTION         Home Medications    Prior to Admission medications   Medication Sig Start Date End Date Taking? Authorizing Provider  acyclovir (ZOVIRAX) 400 MG tablet Take 2 tablets (800 mg total) by mouth 5 (five) times daily for 7 days. 04/16/18 04/23/18  Dahlia Byes A, NP  amLODipine (NORVASC) 10 MG tablet Take 1 tablet (10 mg total) by mouth daily. 10/18/17   Mardella Layman, MD  diclofenac (VOLTAREN) 75 MG EC tablet Take 1 tablet (75 mg total) by mouth 2 (two) times daily.  03/21/18   Caccavale, Sophia, PA-C  lisinopril (PRINIVIL,ZESTRIL) 5 MG tablet Take 1 tablet (5 mg total) by mouth daily. 10/18/17 11/17/17  Mardella Layman, MD  predniSONE (DELTASONE) 50 MG tablet Take one tab daily for 5 days. 04/16/18   Janace Aris, NP    Family History Family History  Problem Relation Age of Onset  . Hypertension Mother     Social History Social History   Tobacco Use  . Smoking status: Current Every Day Smoker    Packs/day: 1.00    Types: Cigarettes  . Smokeless tobacco: Never Used  Substance Use Topics  . Alcohol use: Not Currently    Comment: occassionally  . Drug use: Not Currently    Types: Cocaine    Comment: UDS positive for cocaine and opiates     Allergies   Patient has no known allergies.   Review of Systems Review of Systems   Physical Exam Triage Vital Signs ED Triage Vitals  Enc Vitals Group     BP 04/16/18 1040 133/83     Pulse Rate 04/16/18 1040 84     Resp 04/16/18 1040 18     Temp 04/16/18 1040 98.2 F (36.8 C)     Temp Source 04/16/18 1040 Oral     SpO2 04/16/18 1040 96 %  Weight --      Height --      Head Circumference --      Peak Flow --      Pain Score 04/16/18 1041 7     Pain Loc --      Pain Edu? --      Excl. in GC? --    No data found.  Updated Vital Signs BP 133/83 (BP Location: Left Arm)   Pulse 84   Temp 98.2 F (36.8 C) (Oral)   Resp 18   SpO2 96%   Visual Acuity Right Eye Distance:   Left Eye Distance:   Bilateral Distance:    Right Eye Near:   Left Eye Near:    Bilateral Near:     Physical Exam Vitals signs and nursing note reviewed.  Constitutional:      Appearance: Normal appearance.  HENT:     Head: Normocephalic and atraumatic.     Nose: Nose normal.     Mouth/Throat:     Pharynx: Oropharynx is clear.  Eyes:     Conjunctiva/sclera: Conjunctivae normal.  Neck:     Musculoskeletal: Normal range of motion.  Pulmonary:     Effort: Pulmonary effort is normal.  Musculoskeletal:  Normal range of motion.     Comments: Tenderness described to left hip/buttocks area.  No pain appreciated with deep palpation.  No rash present  Skin:    General: Skin is warm and dry.     Findings: Rash present.     Comments: See picture   Neurological:     Mental Status: He is alert.  Psychiatric:        Mood and Affect: Mood normal.          UC Treatments / Results  Labs (all labs ordered are listed, but only abnormal results are displayed) Labs Reviewed - No data to display  EKG None  Radiology No results found.  Procedures Procedures (including critical care time)  Medications Ordered in UC Medications  ketorolac (TORADOL) injection 60 mg (60 mg Intramuscular Given 04/16/18 1143)    Initial Impression / Assessment and Plan / UC Course  I have reviewed the triage vital signs and the nursing notes.  Pertinent labs & imaging results that were available during my care of the patient were reviewed by me and considered in my medical decision making (see chart for details).     We will go ahead and treat for herpes zoster Pain and rash following the L4 and L5 dermatone.  Acyclovir for the virus and prednisone for pain and inflammation.  Prescriptions printed with good Rx coupons given for her seizure due to cheapest prices Follow up as needed for continued or worsening symptoms   Final Clinical Impressions(s) / UC Diagnoses   Final diagnoses:  Rash     Discharge Instructions     We will go ahead and treat you for shingles Acyclovir 2 tablets 5 times a day for the next 7 days Make sure you complete this full course Prednisone daily for the next 5 days.  Make sure you take this with food. We gave you a Toradol injection here for pain Follow up as needed for continued or worsening symptoms     ED Prescriptions    Medication Sig Dispense Auth. Provider   valACYclovir (VALTREX) 1000 MG tablet  (Status: Discontinued) Take 1 tablet (1,000 mg total) by  mouth 3 (three) times daily. 21 tablet Theophil Thivierge A, NP   predniSONE (DELTASONE) 50  MG tablet  (Status: Discontinued) Take one tab daily for 5 days. 5 tablet Joban Colledge A, NP   predniSONE (DELTASONE) 50 MG tablet Take one tab daily for 5 days. 5 tablet Raushanah Osmundson A, NP   acyclovir (ZOVIRAX) 400 MG tablet Take 2 tablets (800 mg total) by mouth 5 (five) times daily for 7 days. 70 tablet Dahlia ByesBast, Costella Schwarz A, NP     Controlled Substance Prescriptions [place] Controlled Substance Registry consulted? Not Applicable   Janace ArisBast, Makayela Secrest A, NP 04/16/18 1149

## 2018-04-16 NOTE — Discharge Instructions (Addendum)
We will go ahead and treat you for shingles Acyclovir 2 tablets 5 times a day for the next 7 days Make sure you complete this full course Prednisone daily for the next 5 days.  Make sure you take this with food. We gave you a Toradol injection here for pain Follow up as needed for continued or worsening symptoms

## 2018-05-29 ENCOUNTER — Encounter (HOSPITAL_COMMUNITY): Payer: Self-pay

## 2018-05-29 ENCOUNTER — Ambulatory Visit (HOSPITAL_COMMUNITY)
Admission: EM | Admit: 2018-05-29 | Discharge: 2018-05-29 | Disposition: A | Discharge status: Home / Self Care | Payer: Self-pay | Attending: Family Medicine | Admitting: Family Medicine

## 2018-05-29 DIAGNOSIS — I10 Essential (primary) hypertension: Secondary | ICD-10-CM

## 2018-05-29 DIAGNOSIS — R369 Urethral discharge, unspecified: Secondary | ICD-10-CM

## 2018-05-29 HISTORY — DX: Atherosclerotic heart disease of native coronary artery without angina pectoris: I25.10

## 2018-05-29 LAB — POCT URINALYSIS DIP (DEVICE)
Bilirubin Urine: NEGATIVE
Glucose, UA: NEGATIVE mg/dL
Ketones, ur: NEGATIVE mg/dL
Leukocytes,Ua: NEGATIVE
Nitrite: NEGATIVE
Protein, ur: NEGATIVE mg/dL
Specific Gravity, Urine: 1.025 (ref 1.005–1.030)
UROBILINOGEN UA: 0.2 mg/dL (ref 0.0–1.0)
pH: 5.5 (ref 5.0–8.0)

## 2018-05-29 MED ORDER — AZITHROMYCIN 250 MG PO TABS
1000.0000 mg | ORAL_TABLET | Freq: Once | ORAL | Status: AC
  Administered 2018-05-29: 1000 mg via ORAL

## 2018-05-29 MED ORDER — LIDOCAINE HCL (PF) 1 % IJ SOLN
INTRAMUSCULAR | Status: AC
  Filled 2018-05-29: qty 2

## 2018-05-29 MED ORDER — AZITHROMYCIN 250 MG PO TABS
ORAL_TABLET | ORAL | Status: AC
  Filled 2018-05-29: qty 4

## 2018-05-29 MED ORDER — AMLODIPINE BESYLATE 10 MG PO TABS: 10.0000 mg | ORAL_TABLET | Freq: Every day | ORAL | 0 refills | Status: DC

## 2018-05-29 MED ORDER — CEFTRIAXONE SODIUM 250 MG IJ SOLR
INTRAMUSCULAR | Status: AC
  Filled 2018-05-29: qty 250

## 2018-05-29 MED ORDER — CEFTRIAXONE SODIUM 250 MG IJ SOLR
250.0000 mg | Freq: Once | INTRAMUSCULAR | Status: AC
  Administered 2018-05-29: 250 mg via INTRAMUSCULAR

## 2018-05-29 NOTE — ED Provider Notes (Signed)
[facility]    CSN: [id number] Arrival date & time: 05/29/18  1116     History   Chief Complaint Chief Complaint  Patient presents with  . Blood In Urine    HPI Tim [name] is a 43 y.o. male history of CAD, polysubstance abuse, hypertension presenting today for evaluation of hematuria and penile discharge.  Patient states that over the past week he has had clear penile discharge.  It has been more prominent recently.  This morning when he woke up to use the restroom he noticed an episode of hematuria and has had some slight dysuria that began today.  He denies any abdominal pain, nausea or vomiting.  Denies any rashes or lesions.  He is also concerned about his blood pressure as he has been off of his blood pressure medicines.  He has not established care with primary care as it is previously been discussed with him regarding blood pressure medicines.  HPI  Past Medical History:  Diagnosis Date  . Asthma   . Coronary artery disease   . Hypertension   . Noncompliance   . Polysubstance abuse [facility])     Patient Active Problem List   Diagnosis Date Noted  . Dilated cardiomyopathy (HCC) 10/04/2017  . Polysubstance abuse (HCC) 10/04/2017  . OD (overdose of drug) 09/22/2017  . Elevated troponin 09/22/2017  . Essential hypertension     Past Surgical History:  Procedure Laterality Date  . TRANSTHORACIC ECHOCARDIOGRAM  09/23/2017   EF 20 to 25% with diffuse hypokinesis.  GR 1 DD.  Kinesis was more noted in the anterior and anterolateral wall  . WISDOM TOOTH EXTRACTION         Home Medications    Prior to Admission medications   Medication Sig Start Date End Date Taking? Authorizing Provider  amLODipine (NORVASC) 10 MG tablet Take 1 tablet (10 mg total) by mouth daily. 05/29/18   Wieters, Hallie C, PA-C  diclofenac (VOLTAREN) 75 MG EC tablet Take 1 tablet (75 mg total) by mouth 2 (two) times daily. 03/21/18   Caccavale, Sophia, PA-C  lisinopril  (PRINIVIL,ZESTRIL) 5 MG tablet Take 1 tablet (5 mg total) by mouth daily. 10/18/17 11/17/17  Mardella Layman, MD  predniSONE (DELTASONE) 50 MG tablet Take one tab daily for 5 days. 04/16/18   Janace Aris, NP    Family History Family History  Problem Relation Age of Onset  . Hypertension Mother     Social History Social History   Tobacco Use  . Smoking status: Current Every Day Smoker    Packs/day: 1.00    Types: Cigarettes  . Smokeless tobacco: Never Used  Substance Use Topics  . Alcohol use: Not Currently    Comment: occassionally  . Drug use: Not Currently    Types: Cocaine    Comment: UDS positive for cocaine and opiates     Allergies   Patient has no known allergies.   Review of Systems Review of Systems  Constitutional: Negative for fever.  HENT: Negative for sore throat.   Respiratory: Negative for shortness of breath.   Cardiovascular: Negative for chest pain.  Gastrointestinal: Negative for abdominal pain, nausea and vomiting.  Genitourinary: Positive for discharge. Negative for difficulty urinating, dysuria, frequency, penile pain, penile swelling, scrotal swelling and testicular pain.  Skin: Negative for rash.  Neurological: Negative for dizziness, light-headedness and headaches.     Physical Exam Triage Vital Signs ED Triage Vitals  Enc Vitals Group     BP 05/29/18 1302 Marland Kitchen)  154/104     Pulse Rate 05/29/18 1302 82     Resp 05/29/18 1302 18     Temp 05/29/18 1302 98.6 F (37 C)     Temp Source 05/29/18 1302 Oral     SpO2 05/29/18 1302 97 %     Weight --      Height --      Head Circumference --      Peak Flow --      Pain Score 05/29/18 1305 0     Pain Loc --      Pain Edu? --      Excl. in GC? --    No data found.  Updated Vital Signs BP (!) 154/104 (BP Location: Right Arm)   Pulse 82   Temp 98.6 F (37 C) (Oral)   Resp 18   SpO2 97%   Visual Acuity Right Eye Distance:   Left Eye Distance:   Bilateral Distance:    Right Eye Near:     Left Eye Near:    Bilateral Near:     Physical Exam Vitals signs and nursing note reviewed.  Constitutional:      Appearance: He is well-developed.     Comments: No acute distress  HENT:     Head: Normocephalic and atraumatic.     Nose: Nose normal.  Eyes:     Extraocular Movements: Extraocular movements intact.     Conjunctiva/sclera: Conjunctivae normal.     Pupils: Pupils are equal, round, and reactive to light.  Neck:     Musculoskeletal: Neck supple.  Cardiovascular:     Rate and Rhythm: Normal rate and regular rhythm.  Pulmonary:     Effort: Pulmonary effort is normal. No respiratory distress.     Comments: Breathing comfortably at rest, CTABL, no wheezing, rales or other adventitious sounds auscultated Abdominal:     General: There is no distension.  Musculoskeletal: Normal range of motion.  Skin:    General: Skin is warm and dry.  Neurological:     Mental Status: He is alert and oriented to person, place, and time.      UC Treatments / Results  Labs (all labs ordered are listed, but only abnormal results are displayed) Labs Reviewed  POCT URINALYSIS DIP (DEVICE) - Abnormal; Notable for the following components:      Result Value   Hgb urine dipstick MODERATE (*)    All other components within normal limits  URINE CYTOLOGY ANCILLARY ONLY    EKG None  Radiology No results found.  Procedures Procedures (including critical care time)  Medications Ordered in UC Medications  cefTRIAXone (ROCEPHIN) injection 250 mg (250 mg Intramuscular Given 05/29/18 1349)  azithromycin (ZITHROMAX) tablet 1,000 mg (1,000 mg Oral Given 05/29/18 1348)    Initial Impression / Assessment and Plan / UC Course  I have reviewed the triage vital signs and the nursing notes.  Pertinent labs & imaging results that were available during my care of the patient were reviewed by me and considered in my medical decision making (see chart for details).     Moderate hemoglobin, no  back pain, most likely blood from urethritis secondary to STD.  Will empirically treat with Rocephin and azithromycin for gonorrhea and chlamydia, urine cytology obtained and will send off to confirm as well as check for trichomonas.  Advised holding off on sexual intercourse for 1 week until infection fully cleared.  Patient has history of previous hypertension and CAD, not currently on his medicines, will refill  amlodipine, discussed and reinforced the importance of having a primary care to monitor and manage blood pressure.  Asymptomatic at this time.  [facility]  Discussed strict return precautions. Patient verbalized understanding and is agreeable with plan.  Final Clinical Impressions(s) / UC Diagnoses   Final diagnoses:  Penile discharge  Essential hypertension     Discharge Instructions     We have treated you today for gonorrhea and chlamydia, with rocephin and azithromycin. Please refrain from sexual activity for 7 days while medicine is clearing infection.  We are testing you for Gonorrhea, Chlamydia and Trichomonas. We will call you if anything is positive and let you know if you require any further treatment. Please inform partner of any positive results.  Please return if symptoms not improving with treatment, development of fever, nausea, vomiting, abdominal pain, scrotal pain.  BLOOD PRESSURE Your blood pressure was elevated today in clinic. Please be sure to take blood pressure medications as prescribed. Please monitor your blood pressure at home or when you go to a [employer]. Please follow up with your primary care doctor to recheck blood pressure and discuss any need for medication changes.   Please go to Emergency Room if you start to experience severe headache, vision changes, decreased urine production, chest pain, shortness of breath, speech slurring, one sided weakness.   ED Prescriptions    Medication Sig Dispense Auth.  Provider   amLODipine (NORVASC) 10 MG tablet Take 1 tablet (10 mg total) by mouth daily. 30 tablet Wieters, Princess AnneHallie C, PA-C     Controlled Substance Prescriptions [place] Controlled Substance Registry consulted? Not Applicable   Lew DawesWieters, Hallie C, [place]PA-C 05/29/18 1414

## 2018-05-29 NOTE — ED Triage Notes (Signed)
Pt presents with blood in urine and discharge.

## 2018-05-29 NOTE — Discharge Instructions (Addendum)
We have treated you today for gonorrhea and chlamydia, with rocephin and azithromycin. Please refrain from sexual activity for 7 days while medicine is clearing infection.  We are testing you for Gonorrhea, Chlamydia and Trichomonas. We will call you if anything is positive and let you know if you require any further treatment. Please inform partner of any positive results.  Please return if symptoms not improving with treatment, development of fever, nausea, vomiting, abdominal pain, scrotal pain.  BLOOD PRESSURE Your blood pressure was elevated today in clinic. Please be sure to take blood pressure medications as prescribed. Please monitor your blood pressure at home or when you go to a [employer]. Please follow up with your primary care doctor to recheck blood pressure and discuss any need for medication changes.   Please go to Emergency Room if you start to experience severe headache, vision changes, decreased urine production, chest pain, shortness of breath, speech slurring, one sided weakness.

## 2018-05-31 LAB — URINE CYTOLOGY ANCILLARY ONLY
Chlamydia: NEGATIVE
Neisseria Gonorrhea: NEGATIVE
TRICH (WINDOWPATH): NEGATIVE

## 2018-06-12 ENCOUNTER — Encounter (HOSPITAL_COMMUNITY): Payer: Self-pay

## 2018-06-12 ENCOUNTER — Ambulatory Visit (HOSPITAL_COMMUNITY)
Admission: EM | Admit: 2018-06-12 | Discharge: 2018-06-12 | Disposition: A | Discharge status: Home / Self Care | Payer: Self-pay | Attending: Emergency Medicine | Admitting: Emergency Medicine

## 2018-06-12 DIAGNOSIS — Z760 Encounter for issue of repeat prescription: Secondary | ICD-10-CM

## 2018-06-12 DIAGNOSIS — K011 Impacted teeth: Secondary | ICD-10-CM

## 2018-06-12 DIAGNOSIS — I10 Essential (primary) hypertension: Secondary | ICD-10-CM

## 2018-06-12 DIAGNOSIS — K047 Periapical abscess without sinus: Secondary | ICD-10-CM

## 2018-06-12 MED ORDER — AMLODIPINE BESYLATE 10 MG PO TABS: 10.0000 mg | ORAL_TABLET | Freq: Every day | ORAL | 0 refills | Status: DC

## 2018-06-12 MED ORDER — KETOROLAC TROMETHAMINE 30 MG/ML IJ SOLN
30.0000 mg | Freq: Once | INTRAMUSCULAR | Status: AC
  Administered 2018-06-12: 30 mg via INTRAMUSCULAR

## 2018-06-12 MED ORDER — KETOROLAC TROMETHAMINE 30 MG/ML IJ SOLN
INTRAMUSCULAR | Status: AC
  Filled 2018-06-12: qty 1

## 2018-06-12 MED ORDER — LISINOPRIL 5 MG PO TABS: 5.0000 mg | ORAL_TABLET | Freq: Every day | ORAL | 0 refills | Status: DC

## 2018-06-12 MED ORDER — MELOXICAM 7.5 MG PO TABS: 7.5000 mg | ORAL_TABLET | Freq: Every day | ORAL | 0 refills | Status: DC

## 2018-06-12 MED ORDER — AMOXICILLIN-POT CLAVULANATE 875-125 MG PO TABS: 1.0000 | ORAL_TABLET | Freq: Two times a day (BID) | ORAL | 0 refills | Status: AC

## 2018-06-12 MED ORDER — LIDOCAINE VISCOUS HCL 2 % MT SOLN: 10.0000 mL | Freq: Four times a day (QID) | OROMUCOSAL | 0 refills | Status: DC | PRN

## 2018-06-12 NOTE — ED Triage Notes (Signed)
Pt presents with dental pain X 2 days with no relief from OTC medication.

## 2018-06-12 NOTE — ED Provider Notes (Signed)
HPI  SUBJECTIVE:  Tim [name] is a 43 y.o. male who presents with recurrent right upper and lower dental pain for the past 2 days.  He states that it is throbbing, constant, "unbearable".  States that he has an impacted wisdom tooth and the tooth anterior to it has broken off, states that it has decayed more since his last episode of dental pain.  States that he is unable to afford a dentist.  He reports sensitivity to temperature and air.  He has tried 800 mg ibuprofen and Tylenol without improvement of symptoms.  Symptoms are worse with eating.  No fevers, trismus, facial swelling, swelling underneath his jaw, neck stiffness.  No antipyretic in the past 4 to 6 hours.  Patient has been seen twice since September 2019 for dental pain.  First time given Toradol, Voltaren, amoxicillin, told to follow-up with dentist.  Second time, given Toradol, sent home with diclofenac and antibiotics.  Encouraged follow-up with dentistry.  Past Medical History:  Diagnosis Date  . Asthma   . Coronary artery disease   . Hypertension   . Noncompliance   . Polysubstance abuse [facility])     Past Surgical History:  Procedure Laterality Date  . TRANSTHORACIC ECHOCARDIOGRAM  09/23/2017   EF 20 to 25% with diffuse hypokinesis.  GR 1 DD.  Kinesis was more noted in the anterior and anterolateral wall  . WISDOM TOOTH EXTRACTION      Family History  Problem Relation Age of Onset  . Hypertension Mother     Social History   Tobacco Use  . Smoking status: Current Every Day Smoker    Packs/day: 1.00    Types: Cigarettes  . Smokeless tobacco: Never Used  Substance Use Topics  . Alcohol use: Not Currently    Comment: occassionally  . Drug use: Not Currently    Types: Cocaine    Comment: UDS positive for cocaine and opiates    No current facility-administered medications for this encounter.   Current Outpatient Medications:  .  amLODipine (NORVASC) 10 MG tablet, Take 1 tablet (10 mg total) by mouth  daily., Disp: 30 tablet, Rfl: 0 .  amoxicillin-clavulanate (AUGMENTIN) 875-125 MG tablet, Take 1 tablet by mouth 2 (two) times daily for 7 days., Disp: 14 tablet, Rfl: 0 .  lidocaine (XYLOCAINE) 2 % solution, Use as directed 10 mLs in the mouth or throat every 6 (six) hours as needed for mouth pain. Hold in mouth and spit. Do not swallow., Disp: 100 mL, Rfl: 0 .  lisinopril (PRINIVIL,ZESTRIL) 5 MG tablet, Take 1 tablet (5 mg total) by mouth daily for 30 days., Disp: 30 tablet, Rfl: 0 .  meloxicam (MOBIC) 7.5 MG tablet, Take 1 tablet (7.5 mg total) by mouth daily., Disp: 10 tablet, Rfl: 0  No Known Allergies   ROS  As noted in HPI.   Physical Exam  BP (!) 161/106 (BP Location: Right Arm)   Pulse 87   Temp 98.7 F (37.1 C) (Oral)   Resp 20   SpO2 99%   Constitutional: Well developed, well nourished, appears to be in moderate painful distress. Eyes:  EOMI, conjunctiva normal bilaterally HENT: Normocephalic, atraumatic,mucus membranes moist.  Tooth #32, third molar impacted against tooth 31 which is broken and tender to palpation.  Positive mild gingival swelling.  No expressible purulent drainage.Tooth #1, third molar impacted, appears intact.  Tenderness palpation.  No gingival swelling.  No facial swelling, trismus.  No swelling underneath the jaw. Neck: Single anterior cervical lymph node  right side Respiratory: Normal inspiratory effort Cardiovascular: Normal rate GI: nondistended skin: No rash, skin intact Musculoskeletal: no deformities Neurologic: Alert & oriented x 3, no focal neuro deficits Psychiatric: Speech and behavior appropriate   ED Course   Medications  ketorolac (TORADOL) 30 MG/ML injection 30 mg (30 mg Intramuscular Given 06/12/18 1443)    No orders of the defined types were placed in this encounter.   No results found for this or any previous visit (from the past 24 hour(s)). No results found.  ED Clinical Impression  Dental infection  Impacted third  molar tooth  Hypertension, unspecified type  Medication refill   ED Assessment/Plan  Previous records reviewed.  As noted in HPI.    We will give Toradol 30 mg IM here.  Will send home with Mobic, viscous lidocaine, antibiotics, dental referral list.  Blood pressure noted.  States that he has not taken his blood pressure medicine in "months" as he is unable to afford it.  He is not sure if he has an active prescription.  Will also refill his amlodipine and lisinopril.  Discussed with him that it is on the $9/4 list at [facility].  Providing primary care referral list as well.  Emphasized importance of being on blood pressure medications, discussed the risk of stroke, MI, kidney disease with him.  Procedure note: Using half cc of 0.5% bupivacaine.  Performed a dental block using local infiltration with adequate anesthesia on the lower teeth.  Unable to reach right upper teeth.  Discussed MDM, treatment plan, and plan for follow-up with patient. Discussed sn/sx that should prompt return to the ED. patient agrees with plan.   Meds ordered this encounter  Medications  . ketorolac (TORADOL) 30 MG/ML injection 30 mg  . amLODipine (NORVASC) 10 MG tablet    Sig: Take 1 tablet (10 mg total) by mouth daily.    Dispense:  30 tablet    Refill:  0  . lisinopril (PRINIVIL,ZESTRIL) 5 MG tablet    Sig: Take 1 tablet (5 mg total) by mouth daily for 30 days.    Dispense:  30 tablet    Refill:  0  . meloxicam (MOBIC) 7.5 MG tablet    Sig: Take 1 tablet (7.5 mg total) by mouth daily.    Dispense:  10 tablet    Refill:  0  . lidocaine (XYLOCAINE) 2 % solution    Sig: Use as directed 10 mLs in the mouth or throat every 6 (six) hours as needed for mouth pain. Hold in mouth and spit. Do not swallow.    Dispense:  100 mL    Refill:  0  . amoxicillin-clavulanate (AUGMENTIN) 875-125 MG tablet    Sig: Take 1 tablet by mouth 2 (two) times daily for 7 days.    Dispense:  14 tablet    Refill:  0    *This  clinic note was created using Scientist, clinical (histocompatibility and immunogenetics). Therefore, there may be occasional mistakes despite careful proofreading.   ?   Domenick Gong, MD 06/13/18 [phone]

## 2018-06-12 NOTE — Discharge Instructions (Addendum)
Your blood pressure medicine is on the 9/4 dollars list at [facility].  It is also very inexpensive at [employer].  Decrease your salt intake. diet and exercise will lower your blood pressure significantly. It is important to keep your blood pressure under good control, as having a elevated blood pressure for prolonged periods of time significantly increases your risk of stroke, heart attacks, kidney damage, eye damage, and other problems. Measure your blood pressure once a day, preferably at the same time every day. Keep a log of this and bring it to your next doctor's appointment.  Bring your blood pressure cuff as well.  Return here in a week for blood pressure recheck if you're unable to find a primary care physician by then. Return immediately to the ER if you start having chest pain, headache, problems seeing, problems talking, problems walking, if you feel like you're about to pass out, if you do pass out, if you have a seizure, or for any other concerns.  Go to www.goodrx.com to look up your medications. This will give you a list of where you can find your prescriptions at the most affordable prices. Or ask the pharmacist what the cash price is, or if they have any other discount programs available to help make your medication more affordable. This can be less expensive than what you would pay with insurance.    Below is a list of primary care practices who are taking new patients for you to follow-up with.  [facility] at [facility] [address] [place], [place] [address] [phone]  [facility] [address] [place], [place] [address] [phone]  [facility] [phone] [address] [place] [address]  [facility]: [address] [place] [address]  [phone]  [facility]: [address] [place] [address]   [phone]  [facility]: [address] [place] [address]  [phone]  [place] primary care : [address] [place] [address] [phone]  [facility]: [address] [place] [address] [phone]  [facility]: [address] [place] [address] [phone]  Dr. Oneal Grout [address] [facility] [place] [address]  [phone]  Dr. Jackie Plum, [facility]. [address] [place], [place] [address]  [phone]  Look up the [facility] for free dental clinics. [email or web address]-garcia.biz/.asp  Get there early and be prepared to wait. Caralyn Guile and [employer] have Armed forces operational officer schools that provide low cost routine dental care.   Other resources: [facility] [address] [place], [place] [phone]  Patients with Medicaid: [facility] [phone]                                [id number] [address] [employer] Phone:  [phone]  Phone:  [phone]  Dr. Renee Rival [address] [phone]  If unable to pay or uninsured, contact:  [facility] or [facility]. to become qualified for the adult dental clinic.  No matter what dental problem you have, it will not get better unless you get good dental care.  If the tooth is not taken care of, your symptoms will come back in time and you will be visiting [place] again in the Urgent Care Center with a bad toothache.  So, see your dentist as soon as possible.  If you don't have a dentist, we can give you a list of dentists.  Sometimes the most cost effective treatment is removal of the tooth.  This can be done very  inexpensively through one of the low cost [employer], broadcasting/film/video such as the facility on [facility] in [place] [phone]).  The downside to this is that you will have one less tooth and this can effect your ability to chew.  Some other things that can be done for a dental infection include the following:  Rinse your mouth out with hot salt water (1/2 tsp of table salt and a pinch of baking soda in 8 oz of hot water).  You can do this every 2 or 3 hours. Avoid cold foods, beverages, and cold air.  This will make your symptoms worse. Sleep with your head elevated.  Sleeping flat will cause your gums and oral tissues to swell and make them hurt more.  You can sleep on several pillows.  Even better is to sleep in a recliner with your head higher than your heart. For mild to moderate pain, you can take Tylenol, ibuprofen, or Aleve. External application of heat by a heating pad, hot water bottle, or hot wet towel can help with pain and speed healing.  You can do this every 2 to 3 hours. Do not fall asleep on a heating pad since this can cause a burn.

## 2019-03-29 ENCOUNTER — Other Ambulatory Visit: Payer: Self-pay

## 2019-03-29 ENCOUNTER — Ambulatory Visit (HOSPITAL_COMMUNITY)
Admission: EM | Admit: 2019-03-29 | Discharge: 2019-03-29 | Disposition: A | Discharge status: Home / Self Care | Payer: Self-pay | Attending: Emergency Medicine | Admitting: Emergency Medicine

## 2019-03-29 ENCOUNTER — Encounter (HOSPITAL_COMMUNITY): Payer: Self-pay

## 2019-03-29 DIAGNOSIS — J039 Acute tonsillitis, unspecified: Secondary | ICD-10-CM | POA: Insufficient documentation

## 2019-03-29 DIAGNOSIS — I10 Essential (primary) hypertension: Secondary | ICD-10-CM | POA: Insufficient documentation

## 2019-03-29 DIAGNOSIS — Z8249 Family history of ischemic heart disease and other diseases of the circulatory system: Secondary | ICD-10-CM | POA: Insufficient documentation

## 2019-03-29 DIAGNOSIS — I2510 Atherosclerotic heart disease of native coronary artery without angina pectoris: Secondary | ICD-10-CM | POA: Insufficient documentation

## 2019-03-29 DIAGNOSIS — Z79899 Other long term (current) drug therapy: Secondary | ICD-10-CM | POA: Insufficient documentation

## 2019-03-29 DIAGNOSIS — J029 Acute pharyngitis, unspecified: Secondary | ICD-10-CM

## 2019-03-29 DIAGNOSIS — Z791 Long term (current) use of non-steroidal anti-inflammatories (NSAID): Secondary | ICD-10-CM | POA: Insufficient documentation

## 2019-03-29 DIAGNOSIS — I420 Dilated cardiomyopathy: Secondary | ICD-10-CM | POA: Insufficient documentation

## 2019-03-29 DIAGNOSIS — Z20828 Contact with and (suspected) exposure to other viral communicable diseases: Secondary | ICD-10-CM | POA: Insufficient documentation

## 2019-03-29 DIAGNOSIS — F17210 Nicotine dependence, cigarettes, uncomplicated: Secondary | ICD-10-CM | POA: Insufficient documentation

## 2019-03-29 DIAGNOSIS — J45909 Unspecified asthma, uncomplicated: Secondary | ICD-10-CM | POA: Insufficient documentation

## 2019-03-29 DIAGNOSIS — F1911 Other psychoactive substance abuse, in remission: Secondary | ICD-10-CM | POA: Insufficient documentation

## 2019-03-29 DIAGNOSIS — Z792 Long term (current) use of antibiotics: Secondary | ICD-10-CM | POA: Insufficient documentation

## 2019-03-29 LAB — POCT RAPID STREP A: Streptococcus, Group A Screen (Direct): NEGATIVE

## 2019-03-29 LAB — POC SARS CORONAVIRUS 2 AG: SARS Coronavirus 2 Ag: NEGATIVE

## 2019-03-29 LAB — POC SARS CORONAVIRUS 2 AG -  ED
SARS Coronavirus 2 Ag: NEGATIVE
SARS Coronavirus 2 Ag: NEGATIVE

## 2019-03-29 MED ORDER — ACETAMINOPHEN 325 MG PO TABS
650.0000 mg | ORAL_TABLET | Freq: Once | ORAL | Status: AC
  Administered 2019-03-29: 650 mg via ORAL

## 2019-03-29 MED ORDER — IBUPROFEN 800 MG PO TABS
ORAL_TABLET | ORAL | Status: AC
  Filled 2019-03-29: qty 1

## 2019-03-29 MED ORDER — AMOXICILLIN 500 MG PO CAPS: 500.0000 mg | ORAL_CAPSULE | Freq: Two times a day (BID) | ORAL | 0 refills | Status: AC

## 2019-03-29 MED ORDER — IBUPROFEN 800 MG PO TABS
800.0000 mg | ORAL_TABLET | Freq: Once | ORAL | Status: AC
  Administered 2019-03-29: 800 mg via ORAL

## 2019-03-29 MED ORDER — ACETAMINOPHEN 325 MG PO TABS
ORAL_TABLET | ORAL | Status: AC
  Filled 2019-03-29: qty 2

## 2019-03-29 NOTE — ED Provider Notes (Signed)
[facility]    CSN: [id number] Arrival date & time: 03/29/19  0915      History   Chief Complaint Chief Complaint  Patient presents with  . Sore Throat    HPI Tim GrieveOctavius K [name] is a 43 y.o. male.   Tim Grievectavius K Sahlin presents with complaints of sore throat. Worsening. First noted yesterday. No headache or  Body aches. No nausea or vomiting. No cough or runny nose. No known ill contacts. Hasn't taken any medications today for symptoms. History  Of strep throat, feels similar. Not working currently. Noted temperature here but denies any previous known fever. Per chart review had similar presentation 10/2017 with negative strep, which did resolve with amoxicillin. History  Of asthma, htn, polysubstance abuse.     ROS per HPI, negative if not otherwise mentioned.      Past Medical History:  Diagnosis Date  . Asthma   . Coronary artery disease   . Hypertension   . Noncompliance   . Polysubstance abuse [facility](HCC)     Patient Active Problem List   Diagnosis Date Noted  . Dilated cardiomyopathy (HCC) 10/04/2017  . Polysubstance abuse (HCC) 10/04/2017  . OD (overdose of drug) 09/22/2017  . Elevated troponin 09/22/2017  . Essential hypertension     Past Surgical History:  Procedure Laterality Date  . TRANSTHORACIC ECHOCARDIOGRAM  09/23/2017   EF 20 to 25% with diffuse hypokinesis.  GR 1 DD.  Kinesis was more noted in the anterior and anterolateral wall  . WISDOM TOOTH EXTRACTION         Home Medications    Prior to Admission medications   Medication Sig Start Date End Date Taking? Authorizing Provider  amLODipine (NORVASC) 10 MG tablet Take 1 tablet (10 mg total) by mouth daily. 06/12/18   Domenick GongMortenson, Ashley, MD  amoxicillin (AMOXIL) 500 MG capsule Take 1 capsule (500 mg total) by mouth 2 (two) times daily for 10 days. 03/29/19 04/08/19  Georgetta HaberBurky, Karsten Vaughn B, NP  lidocaine (XYLOCAINE) 2 % solution Use as directed 10 mLs in the mouth or throat every 6 (six) hours as  needed for mouth pain. Hold in mouth and spit. Do not swallow. 06/12/18   Domenick GongMortenson, Ashley, MD  lisinopril (PRINIVIL,ZESTRIL) 5 MG tablet Take 1 tablet (5 mg total) by mouth daily for 30 days. 06/12/18 07/12/18  Domenick GongMortenson, Ashley, MD  meloxicam (MOBIC) 7.5 MG tablet Take 1 tablet (7.5 mg total) by mouth daily. 06/12/18   Domenick GongMortenson, Ashley, MD    Family History Family History  Problem Relation Age of Onset  . Hypertension Mother     Social History Social History   Tobacco Use  . Smoking status: Current Every Day Smoker    Packs/day: 1.00    Types: Cigarettes  . Smokeless tobacco: Never Used  Substance Use Topics  . Alcohol use: Not Currently    Comment: occassionally  . Drug use: Not Currently    Types: Cocaine    Comment: UDS positive for cocaine and opiates     Allergies   Patient has no known allergies.   Review of Systems Review of Systems   Physical Exam Triage Vital Signs ED Triage Vitals  Enc Vitals Group     BP 03/29/19 0948 (!) 140/94     Pulse Rate 03/29/19 0948 (!) 115     Resp 03/29/19 0948 (!) 22     Temp 03/29/19 0948 (!) 100.7 F (38.2 C)     Temp Source 03/29/19 0948 Oral  SpO2 03/29/19 0948 96 %     Weight --      Height --      Head Circumference --      Peak Flow --      Pain Score 03/29/19 0946 8     Pain Loc --      Pain Edu? --      Excl. in [place]? --    No data found.  Updated Vital Signs BP (!) 140/94 (BP Location: Left Arm)   Pulse (!) 115   Temp (!) 100.7 F (38.2 C) (Oral)   Resp (!) 22   SpO2 96%    Physical Exam Constitutional:      Appearance: He is well-developed.  HENT:     Head: Atraumatic.     Mouth/Throat:     Mouth: Mucous membranes are moist.     Tonsils: Tonsillar exudate present. 2+ on the right. 2+ on the left.  Cardiovascular:     Rate and Rhythm: Tachycardia present.  Pulmonary:     Effort: Pulmonary effort is normal.  Lymphadenopathy:     Cervical: Cervical adenopathy present.  Skin:    General: Skin  is warm and dry.  Neurological:     Mental Status: He is alert and oriented to person, place, and time.      UC Treatments / Results  Labs (all labs ordered are listed, but only abnormal results are displayed) Labs Reviewed  NOVEL CORONAVIRUS, NAA (HOSP ORDER, SEND-OUT TO REF LAB; TAT 18-24 HRS)  CULTURE, GROUP A STREP ([place])  POC SARS CORONAVIRUS 2 AG -  ED  POC SARS CORONAVIRUS 2 AG  POCT RAPID STREP A    EKG   Radiology No results found.  Procedures Procedures (including critical care time)  Medications Ordered in UC Medications  ibuprofen (ADVIL) tablet 800 mg (has no administration in time range)  acetaminophen (TYLENOL) tablet 650 mg (650 mg Oral Given 03/29/19 1004)    Initial Impression / Assessment and Plan / UC Course  I have reviewed the triage vital signs and the nursing notes.  Pertinent labs & imaging results that were available during my care of the patient were reviewed by me and considered in my medical decision making (see chart for details).     Negative rapid covid and rapid strep, culture and send out pcr in process. Amoxicillin provided as patient with fever, tachycardia and fair amount of tonsillitis. Return precautions provided. Patient verbalized understanding and agreeable to plan.   Final Clinical Impressions(s) / UC Diagnoses   Final diagnoses:  Tonsillitis  Pharyngitis, unspecified etiology     Discharge Instructions     Your rapid strep and rapid covid testing are negative today.  We have sent out follow up testing to confirm these results. Will notify of any positive findings and if any changes to treatment are needed.   Self isolate until your next covid test is back. You may monitor your results on your MyChart online as well.  I am starting you on antibiotics to complete.  Tylenol and/or ibuprofen as needed for pain or fevers.  Throat lozenges, gargles, chloraseptic spray, warm teas, popsicles etc to help with throat pain.     If symptoms worsen or do not improve in the next week to return to be seen or to follow up with PCP.      ED Prescriptions    Medication Sig Dispense Auth. Provider   amoxicillin (AMOXIL) 500 MG capsule Take 1 capsule (500 mg total)  by mouth 2 (two) times daily for 10 days. 20 capsule Georgetta Haber, NP     PDMP not reviewed this encounter.   Georgetta Haber, NP 03/29/19 1433

## 2019-03-29 NOTE — Discharge Instructions (Signed)
Your rapid strep and rapid covid testing are negative today.  We have sent out follow up testing to confirm these results. Will notify of any positive findings and if any changes to treatment are needed.   Self isolate until your next covid test is back. You may monitor your results on your MyChart online as well.  I am starting you on antibiotics to complete.  Tylenol and/or ibuprofen as needed for pain or fevers.  Throat lozenges, gargles, chloraseptic spray, warm teas, popsicles etc to help with throat pain.   If symptoms worsen or do not improve in the next week to return to be seen or to follow up with PCP.

## 2019-03-29 NOTE — ED Triage Notes (Signed)
Patient presents to Urgent Care with complaints of sore throat since last night. Patient reports he has  Been having chills, it is very painful to swallow. Pt has not taken meds for his sx.

## 2019-03-30 ENCOUNTER — Encounter (HOSPITAL_COMMUNITY): Payer: Self-pay | Admitting: Family Medicine

## 2019-03-30 ENCOUNTER — Ambulatory Visit (HOSPITAL_COMMUNITY)
Admission: EM | Admit: 2019-03-30 | Discharge: 2019-03-30 | Disposition: A | Discharge status: Home / Self Care | Payer: Self-pay | Attending: Family Medicine | Admitting: Family Medicine

## 2019-03-30 ENCOUNTER — Other Ambulatory Visit: Payer: Self-pay

## 2019-03-30 DIAGNOSIS — J029 Acute pharyngitis, unspecified: Secondary | ICD-10-CM

## 2019-03-30 LAB — CULTURE, GROUP A STREP (THRC)

## 2019-03-30 MED ORDER — LIDOCAINE HCL (PF) 1 % IJ SOLN
INTRAMUSCULAR | Status: AC
  Filled 2019-03-30: qty 2

## 2019-03-30 MED ORDER — IBUPROFEN 800 MG PO TABS: 800.0000 mg | ORAL_TABLET | Freq: Once | ORAL | Status: DC

## 2019-03-30 MED ORDER — AMLODIPINE BESYLATE 10 MG PO TABS: 10.0000 mg | ORAL_TABLET | Freq: Every day | ORAL | 11 refills | Status: DC

## 2019-03-30 MED ORDER — CEFTRIAXONE SODIUM 1 G IJ SOLR
1.0000 g | Freq: Once | INTRAMUSCULAR | Status: AC
  Administered 2019-03-30: 1 g via INTRAMUSCULAR

## 2019-03-30 MED ORDER — ACETAMINOPHEN 325 MG PO TABS
650.0000 mg | ORAL_TABLET | Freq: Once | ORAL | Status: AC
  Administered 2019-03-30: 17:00:00 650 mg via ORAL

## 2019-03-30 MED ORDER — CEFTRIAXONE SODIUM 1 G IJ SOLR
INTRAMUSCULAR | Status: AC
  Filled 2019-03-30: qty 10

## 2019-03-30 MED ORDER — HYDROCODONE-ACETAMINOPHEN 5-325 MG PO TABS: 1.0000 | ORAL_TABLET | Freq: Four times a day (QID) | ORAL | 0 refills | Status: DC | PRN

## 2019-03-30 MED ORDER — ACETAMINOPHEN 325 MG PO TABS
ORAL_TABLET | ORAL | Status: AC
  Filled 2019-03-30: qty 2

## 2019-03-30 NOTE — ED Triage Notes (Signed)
Pt present sore throat and fever. Symptoms started 2 days ago. Pt was just seen yesterday and symptoms has not gotten better.

## 2019-03-30 NOTE — ED Provider Notes (Signed)
[place]    CSN: [id number] Arrival date & time: 03/30/19  1527      History   Chief Complaint Chief Complaint  Patient presents with  . Sore Throat  . Fever    HPI Tim [name] is a 43 y.o. male.   Pt present sore throat and fever. Symptoms started 2 days ago. Pt was just seen yesterday and symptoms has not gotten better.   He confides that he had oral sex a day before onset of symptoms      Past Medical History:  Diagnosis Date  . Asthma   . Coronary artery disease   . Hypertension   . Noncompliance   . Polysubstance abuse [facility])     Patient Active Problem List   Diagnosis Date Noted  . Dilated cardiomyopathy ([place]) 10/04/2017  . Polysubstance abuse ([place]) 10/04/2017  . OD (overdose of drug) 09/22/2017  . Elevated troponin 09/22/2017  . Essential hypertension     Past Surgical History:  Procedure Laterality Date  . TRANSTHORACIC ECHOCARDIOGRAM  09/23/2017   EF 20 to 25% with diffuse hypokinesis.  GR 1 DD.  Kinesis was more noted in the anterior and anterolateral wall  . WISDOM TOOTH EXTRACTION         Home Medications    Prior to Admission medications   Medication Sig Start Date End Date Taking? Authorizing Provider  amLODipine (NORVASC) 10 MG tablet Take 1 tablet (10 mg total) by mouth daily. 03/30/19   Robyn Haber, MD  amoxicillin (AMOXIL) 500 MG capsule Take 1 capsule (500 mg total) by mouth 2 (two) times daily for 10 days. 03/29/19 04/08/19  Zigmund Gottron, NP  HYDROcodone-acetaminophen (NORCO) 5-325 MG tablet Take 1 tablet by mouth every 6 (six) hours as needed for moderate pain. 03/30/19   Robyn Haber, MD  lidocaine (XYLOCAINE) 2 % solution Use as directed 10 mLs in the mouth or throat every 6 (six) hours as needed for mouth pain. Hold in mouth and spit. Do not swallow. 06/12/18   Melynda Ripple, MD  lisinopril (PRINIVIL,ZESTRIL) 5 MG tablet Take 1 tablet (5 mg total) by mouth daily for 30 days. 06/12/18 07/12/18   Melynda Ripple, MD    Family History Family History  Problem Relation Age of Onset  . Hypertension Mother     Social History Social History   Tobacco Use  . Smoking status: Current Every Day Smoker    Packs/day: 1.00    Types: Cigarettes  . Smokeless tobacco: Never Used  Substance Use Topics  . Alcohol use: Not Currently    Comment: occassionally  . Drug use: Not Currently    Types: Cocaine    Comment: UDS positive for cocaine and opiates     Allergies   Patient has no known allergies.   Review of Systems Review of Systems  Constitutional: Positive for fatigue and fever.  HENT: Positive for sore throat.      Physical Exam Triage Vital Signs ED Triage Vitals  Enc Vitals Group     BP      Pulse      Resp      Temp      Temp src      SpO2      Weight      Height      Head Circumference      Peak Flow      Pain Score      Pain Loc      Pain Edu?  Excl. in [place]?    No data found.  Updated Vital Signs BP (!) 153/106 (BP Location: Left Arm)   Pulse (!) 102   Temp (!) 100.7 F (38.2 C) (Oral)   Resp 18   SpO2 100%    Physical Exam Vitals and nursing note reviewed.  Constitutional:      General: He is in acute distress.     Appearance: He is well-developed and normal weight. He is not ill-appearing or toxic-appearing.  HENT:     Head: Normocephalic.     Mouth/Throat:     Pharynx: Pharyngeal swelling and posterior oropharyngeal erythema present. No oropharyngeal exudate or uvula swelling.     Tonsils: No tonsillar exudate.  Eyes:     Conjunctiva/sclera: Conjunctivae normal.  Abdominal:     Palpations: Abdomen is soft.  Musculoskeletal:     Cervical back: Normal range of motion and neck supple.  Lymphadenopathy:     Cervical: Cervical adenopathy present.  Skin:    General: Skin is warm and dry.  Neurological:     General: No focal deficit present.     Mental Status: He is alert.      UC Treatments / Results  Labs (all labs  ordered are listed, but only abnormal results are displayed) Labs Reviewed - No data to display  EKG   Radiology No results found.  Procedures Procedures (including critical care time)  Medications Ordered in UC Medications  cefTRIAXone (ROCEPHIN) injection 1 g (has no administration in time range)  ibuprofen (ADVIL) tablet 800 mg (has no administration in time range)    Initial Impression / Assessment and Plan / UC Course  I have reviewed the triage vital signs and the nursing notes.  Pertinent labs & imaging results that were available during my care of the patient were reviewed by me and considered in my medical decision making (see chart for details).    Final Clinical Impressions(s) / UC Diagnoses   Final diagnoses:  Acute pharyngitis, unspecified etiology     Discharge Instructions     Continue gargles.    ED Prescriptions    Medication Sig Dispense Auth. Provider   amLODipine (NORVASC) 10 MG tablet Take 1 tablet (10 mg total) by mouth daily. 30 tablet Elvina Sidle, MD   HYDROcodone-acetaminophen (NORCO) 5-325 MG tablet Take 1 tablet by mouth every 6 (six) hours as needed for moderate pain. 12 tablet Elvina Sidle, MD     I have reviewed the PDMP during this encounter.   Elvina Sidle, MD 03/30/19 1625

## 2019-03-30 NOTE — Discharge Instructions (Addendum)
Continue gargles.

## 2019-04-01 LAB — NOVEL CORONAVIRUS, NAA (HOSP ORDER, SEND-OUT TO REF LAB; TAT 18-24 HRS): SARS-CoV-2, NAA: NOT DETECTED

## 2019-05-10 ENCOUNTER — Other Ambulatory Visit: Payer: Self-pay

## 2019-05-10 ENCOUNTER — Encounter (HOSPITAL_COMMUNITY): Payer: Self-pay | Admitting: Emergency Medicine

## 2019-05-10 ENCOUNTER — Ambulatory Visit (HOSPITAL_COMMUNITY)
Admission: EM | Admit: 2019-05-10 | Discharge: 2019-05-10 | Disposition: A | Discharge status: Home / Self Care | Payer: Self-pay | Attending: Physician Assistant | Admitting: Physician Assistant

## 2019-05-10 DIAGNOSIS — B353 Tinea pedis: Secondary | ICD-10-CM | POA: Insufficient documentation

## 2019-05-10 DIAGNOSIS — Z7251 High risk heterosexual behavior: Secondary | ICD-10-CM

## 2019-05-10 DIAGNOSIS — Z7689 Persons encountering health services in other specified circumstances: Secondary | ICD-10-CM | POA: Insufficient documentation

## 2019-05-10 DIAGNOSIS — R369 Urethral discharge, unspecified: Secondary | ICD-10-CM | POA: Insufficient documentation

## 2019-05-10 DIAGNOSIS — L089 Local infection of the skin and subcutaneous tissue, unspecified: Secondary | ICD-10-CM | POA: Insufficient documentation

## 2019-05-10 DIAGNOSIS — I10 Essential (primary) hypertension: Secondary | ICD-10-CM | POA: Insufficient documentation

## 2019-05-10 LAB — CBG MONITORING, ED
Glucose-Capillary: 96 mg/dL (ref 70–99)
Glucose-Capillary: 96 mg/dL (ref 70–99)

## 2019-05-10 LAB — GLUCOSE, CAPILLARY: Glucose-Capillary: 96 mg/dL (ref 70–99)

## 2019-05-10 MED ORDER — KETOROLAC TROMETHAMINE 60 MG/2ML IM SOLN
60.0000 mg | Freq: Once | INTRAMUSCULAR | Status: AC
  Administered 2019-05-10: 60 mg via INTRAMUSCULAR

## 2019-05-10 MED ORDER — CLOTRIMAZOLE 1 % EX CREA: TOPICAL_CREAM | CUTANEOUS | 0 refills | Status: DC

## 2019-05-10 MED ORDER — KETOROLAC TROMETHAMINE 60 MG/2ML IM SOLN
INTRAMUSCULAR | Status: AC
  Filled 2019-05-10: qty 2

## 2019-05-10 MED ORDER — LISINOPRIL 5 MG PO TABS: 5.0000 mg | ORAL_TABLET | Freq: Every day | ORAL | 0 refills | Status: DC

## 2019-05-10 MED ORDER — CEFTRIAXONE SODIUM 500 MG IJ SOLR
INTRAMUSCULAR | Status: AC
  Filled 2019-05-10: qty 500

## 2019-05-10 MED ORDER — DOXYCYCLINE HYCLATE 100 MG PO CAPS: 100.0000 mg | ORAL_CAPSULE | Freq: Two times a day (BID) | ORAL | 0 refills | Status: AC

## 2019-05-10 MED ORDER — DOXYCYCLINE HYCLATE 100 MG PO CAPS: 100.0000 mg | ORAL_CAPSULE | Freq: Two times a day (BID) | ORAL | 0 refills | Status: DC

## 2019-05-10 MED ORDER — LIDOCAINE HCL (PF) 1 % IJ SOLN
INTRAMUSCULAR | Status: AC
  Filled 2019-05-10: qty 2

## 2019-05-10 MED ORDER — CEFTRIAXONE SODIUM 500 MG IJ SOLR
500.0000 mg | Freq: Once | INTRAMUSCULAR | Status: AC
  Administered 2019-05-10: 500 mg via INTRAMUSCULAR

## 2019-05-10 MED ORDER — NAPROXEN 500 MG PO TABS: 500.0000 mg | ORAL_TABLET | Freq: Two times a day (BID) | ORAL | 0 refills | Status: DC

## 2019-05-10 NOTE — ED Provider Notes (Signed)
[facility]    CSN: [id number] Arrival date & time: 05/10/19  1126      History   Chief Complaint Chief Complaint  Patient presents with  . Toe Pain    right second  . SEXUALLY TRANSMITTED DISEASE    HPI Tim [name] is a 44 y.o. male.   Patient reports to urgent care today for right toe pain and penile discharge. He reports the right 2nd  toe pain has been present for a long time. He injured the same toe 2 years ago but there was no fracture or dislocation then. He has had on and off issues with it since then. In the last 5-6 months he has noticed it has become worse and recently he has had significantly more pain. He has had a skin lesion present for a while on the toe as well. He been putting neosporyn and "A&D ointment" on it. The pain is such that he can not fully bend the toe and it hurts to walk. He notes a bump under the toe that is like a callus that he has scrapped at as well. He denies fever, chills or other toe involvement.  As well he reports unprotected sex with a male partner 3-4 days ago. He does not know this person well and is unsure of their status. He has noted penile discharge since then. It is clear. He denies painful urination.   He would also like a refill of his blood pressure medication.     Past Medical History:  Diagnosis Date  . Asthma   . Coronary artery disease   . Hypertension   . Noncompliance   . Polysubstance abuse [facility])     Patient Active Problem List   Diagnosis Date Noted  . Dilated cardiomyopathy (HCC) 10/04/2017  . Polysubstance abuse (HCC) 10/04/2017  . OD (overdose of drug) 09/22/2017  . Elevated troponin 09/22/2017  . Essential hypertension     Past Surgical History:  Procedure Laterality Date  . TRANSTHORACIC ECHOCARDIOGRAM  09/23/2017   EF 20 to 25% with diffuse hypokinesis.  GR 1 DD.  Kinesis was more noted in the anterior and anterolateral wall  . WISDOM TOOTH EXTRACTION         Home Medications     Prior to Admission medications   Medication Sig Start Date End Date Taking? Authorizing Provider  amLODipine (NORVASC) 10 MG tablet Take 1 tablet (10 mg total) by mouth daily. 03/30/19   Elvina Sidle, MD  clotrimazole (LOTRIMIN) 1 % cream Apply to affected area 2 times daily 05/10/19   Johnathyn Viscomi, Veryl Speak, PA-C  doxycycline (VIBRAMYCIN) 100 MG capsule Take 1 capsule (100 mg total) by mouth 2 (two) times daily for 10 days. 05/10/19 05/20/19  Cassanda Walmer, Veryl Speak, PA-C  HYDROcodone-acetaminophen (NORCO) 5-325 MG tablet Take 1 tablet by mouth every 6 (six) hours as needed for moderate pain. 03/30/19   Elvina Sidle, MD  lidocaine (XYLOCAINE) 2 % solution Use as directed 10 mLs in the mouth or throat every 6 (six) hours as needed for mouth pain. Hold in mouth and spit. Do not swallow. 06/12/18   Domenick Gong, MD  lisinopril (ZESTRIL) 5 MG tablet Take 1 tablet (5 mg total) by mouth daily. 05/10/19 06/09/19  Jameson Tormey, Veryl Speak, PA-C  naproxen (NAPROSYN) 500 MG tablet Take 1 tablet (500 mg total) by mouth 2 (two) times daily. 05/10/19   Markeshia Giebel, Veryl Speak, PA-C    Family History Family History  Problem Relation Age of Onset  .  Hypertension Mother     Social History Social History   Tobacco Use  . Smoking status: Current Every Day Smoker    Packs/day: 1.00    Types: Cigarettes  . Smokeless tobacco: Never Used  Substance Use Topics  . Alcohol use: Not Currently    Comment: occassionally  . Drug use: Not Currently    Types: Cocaine    Comment: UDS positive for cocaine and opiates     Allergies   Patient has no known allergies.   Review of Systems Review of Systems  Constitutional: Negative for chills, fatigue and fever.  HENT: Negative for ear pain and sore throat.   Eyes: Negative for pain and visual disturbance.  Respiratory: Negative for cough and shortness of breath.   Cardiovascular: Negative for chest pain and palpitations.  Gastrointestinal: Negative for abdominal pain and vomiting.   Genitourinary: Positive for discharge. Negative for dysuria, frequency, hematuria, penile pain, penile swelling, scrotal swelling, testicular pain and urgency.  Musculoskeletal: Positive for arthralgias and gait problem. Negative for back pain and myalgias.  Skin: Negative for color change and rash.  Neurological: Negative for headaches.  All other systems reviewed and are negative.    Physical Exam Triage Vital Signs ED Triage Vitals  Enc Vitals Group     BP 05/10/19 1214 (!) 151/96     Pulse Rate 05/10/19 1214 78     Resp 05/10/19 1214 18     Temp 05/10/19 1214 98.3 F (36.8 C)     Temp Source 05/10/19 1214 Oral     SpO2 05/10/19 1214 100 %     Weight --      Height --      Head Circumference --      Peak Flow --      Pain Score 05/10/19 1213 8     Pain Loc --      Pain Edu? --      Excl. in [place]? --    No data found.  Updated Vital Signs BP (!) 151/96 (BP Location: Right Arm)   Pulse 78   Temp 98.3 F (36.8 C) (Oral)   Resp 18   SpO2 100%   Visual Acuity Right Eye Distance:   Left Eye Distance:   Bilateral Distance:    Right Eye Near:   Left Eye Near:    Bilateral Near:     Physical Exam Vitals and nursing note reviewed.  Constitutional:      General: He is not in acute distress.    Appearance: Normal appearance. He is well-developed. He is not ill-appearing.  HENT:     Head: Normocephalic and atraumatic.  Eyes:     General: No scleral icterus.    Conjunctiva/sclera: Conjunctivae normal.     Pupils: Pupils are equal, round, and reactive to light.  Cardiovascular:     Rate and Rhythm: Normal rate and regular rhythm.     Heart sounds: No murmur.  Pulmonary:     Effort: Pulmonary effort is normal. No respiratory distress.     Breath sounds: Normal breath sounds.  Abdominal:     Palpations: Abdomen is soft.     Tenderness: There is no abdominal tenderness.  Genitourinary:    Penis: Normal.      Testes: Normal.  Musculoskeletal:     Cervical back:  Neck supple.     Right lower leg: No edema.     Left lower leg: No edema.  Feet:     Right foot:  Skin integrity: Skin breakdown (between first and 2nd toes), erythema (over PIP of 2nd toe, ) and callus (under PIP of 2nd digit) present.     Comments: Maceration and evidence of fungal infection between 1st and 2nd toes on right foot.  Skin:    General: Skin is warm and dry.     Capillary Refill: Capillary refill takes less than 2 seconds.  Neurological:     General: No focal deficit present.     Mental Status: He is alert and oriented to person, place, and time.  Psychiatric:        Mood and Affect: Mood normal.        Behavior: Behavior normal.        Thought Content: Thought content normal.        Judgment: Judgment normal.      UC Treatments / Results  Labs (all labs ordered are listed, but only abnormal results are displayed) Labs Reviewed  GLUCOSE, CAPILLARY  CBG MONITORING, ED  CBG MONITORING, ED  CYTOLOGY, (ORAL, ANAL, URETHRAL) ANCILLARY ONLY    EKG   Radiology No results found.  Procedures Procedures (including critical care time)  Medications Ordered in UC Medications  ketorolac (TORADOL) injection 60 mg (60 mg Intramuscular Given 05/10/19 1325)  cefTRIAXone (ROCEPHIN) injection 500 mg (500 mg Intramuscular Given 05/10/19 1325)    Initial Impression / Assessment and Plan / UC Course  I have reviewed the triage vital signs and the nursing notes.  Pertinent labs & imaging results that were available during my care of the patient were reviewed by me and considered in my medical decision making (see chart for details).     #Toe Infection - believe tinea pedis began skin break down which has led to bacterial infection. Callus that is present is also likely creating cycle of infection. Will need this removed at some point. Treating with topical lotrimin and doxycycline. Naproxen for pain and toradol given today  #Penile Discharge - discharge x 3 days,  unsure of exposure risk. Treated with rocephin 500mg  and doxycycline sent. Swab sent. Patient decline HIV and RPR today, discussed that this is recommended.   #Elevated Blood Pressure - refilled lisonpril. Instructed need to establish a primary care provider.   Return and follow up precautions for all conditions discussed.   Final Clinical Impressions(s) / UC Diagnoses   Final diagnoses:  Athlete's foot on right  Toe infection  Penile discharge  Encounter for assessment of STD exposure  Elevated blood pressure reading in office with diagnosis of hypertension     Discharge Instructions     Take all medications as prescribed.  Apply the lotrimin daily until the skin infection has resolved.  Call the Foot and ankle office that is attached to discuss being evaluated   I have requested a primary care assistance for you, please follow up with a Primary care provider once you have been contacted. This will be better for your long term blood pressure management.  Ice and elevate the toe at night.   If your toe is not improving in about 1 week, please return for reevaluation. If you develop fever before then, please return.   We have sent lab tests off, we will notify you of any treatment changes.      ED Prescriptions    Medication Sig Dispense Auth. Provider   doxycycline (VIBRAMYCIN) 100 MG capsule  (Status: Discontinued) Take 1 capsule (100 mg total) by mouth 2 (two) times daily. 20 capsule Mykhia Danish, ,  PA-C   clotrimazole (LOTRIMIN) 1 % cream Apply to affected area 2 times daily 15 g Coral Soler, Marguerita Beards, PA-C   naproxen (NAPROSYN) 500 MG tablet Take 1 tablet (500 mg total) by mouth 2 (two) times daily. 30 tablet Stokes Rattigan, Marguerita Beards, PA-C   doxycycline (VIBRAMYCIN) 100 MG capsule Take 1 capsule (100 mg total) by mouth 2 (two) times daily for 10 days. 20 capsule Terrill Wauters, Marguerita Beards, PA-C   lisinopril (ZESTRIL) 5 MG tablet Take 1 tablet (5 mg total) by mouth daily. 30 tablet Bennett Ram, Marguerita Beards, PA-C      PDMP not reviewed this encounter.   Purnell Shoemaker, PA-C 05/10/19 1529

## 2019-05-10 NOTE — Discharge Instructions (Addendum)
Take all medications as prescribed.  Apply the lotrimin daily until the skin infection has resolved.  Call the Foot and ankle office that is attached to discuss being evaluated   I have requested a primary care assistance for you, please follow up with a Primary care provider once you have been contacted. This will be better for your long term blood pressure management.  Ice and elevate the toe at night.   If your toe is not improving in about 1 week, please return for reevaluation. If you develop fever before then, please return.   We have sent lab tests off, we will notify you of any treatment changes.

## 2019-05-10 NOTE — ED Triage Notes (Signed)
Pt reports right second toe pain from a previous injury.  He was previously seen for the original injury, but he states he is having a lot of pain.    Pt also reports penile discharge for three days.

## 2019-05-13 LAB — CYTOLOGY, (ORAL, ANAL, URETHRAL) ANCILLARY ONLY
Chlamydia: NEGATIVE
Neisseria Gonorrhea: NEGATIVE
Trichomonas: NEGATIVE

## 2019-08-25 ENCOUNTER — Encounter (HOSPITAL_COMMUNITY): Payer: Self-pay | Admitting: Emergency Medicine

## 2019-08-25 ENCOUNTER — Inpatient Hospital Stay (HOSPITAL_COMMUNITY)
Admission: EM | Admit: 2019-08-25 | Discharge: 2019-08-29 | DRG: 029 | Disposition: A | Discharge status: Home / Self Care | Payer: Self-pay | Attending: Neurological Surgery | Admitting: Neurological Surgery

## 2019-08-25 ENCOUNTER — Other Ambulatory Visit: Payer: Self-pay

## 2019-08-25 DIAGNOSIS — Z8249 Family history of ischemic heart disease and other diseases of the circulatory system: Secondary | ICD-10-CM

## 2019-08-25 DIAGNOSIS — W19XXXA Unspecified fall, initial encounter: Secondary | ICD-10-CM

## 2019-08-25 DIAGNOSIS — I5042 Chronic combined systolic (congestive) and diastolic (congestive) heart failure: Secondary | ICD-10-CM | POA: Diagnosis present

## 2019-08-25 DIAGNOSIS — F1910 Other psychoactive substance abuse, uncomplicated: Secondary | ICD-10-CM | POA: Diagnosis present

## 2019-08-25 DIAGNOSIS — J45909 Unspecified asthma, uncomplicated: Secondary | ICD-10-CM | POA: Diagnosis present

## 2019-08-25 DIAGNOSIS — S14129A Central cord syndrome at unspecified level of cervical spinal cord, initial encounter: Secondary | ICD-10-CM | POA: Diagnosis present

## 2019-08-25 DIAGNOSIS — S14125A Central cord syndrome at C5 level of cervical spinal cord, initial encounter: Principal | ICD-10-CM | POA: Diagnosis present

## 2019-08-25 DIAGNOSIS — M4802 Spinal stenosis, cervical region: Secondary | ICD-10-CM

## 2019-08-25 DIAGNOSIS — G8194 Hemiplegia, unspecified affecting left nondominant side: Secondary | ICD-10-CM | POA: Diagnosis present

## 2019-08-25 DIAGNOSIS — G9520 Unspecified cord compression: Secondary | ICD-10-CM

## 2019-08-25 DIAGNOSIS — R29898 Other symptoms and signs involving the musculoskeletal system: Secondary | ICD-10-CM

## 2019-08-25 DIAGNOSIS — R269 Unspecified abnormalities of gait and mobility: Secondary | ICD-10-CM | POA: Diagnosis present

## 2019-08-25 DIAGNOSIS — I420 Dilated cardiomyopathy: Secondary | ICD-10-CM | POA: Diagnosis present

## 2019-08-25 DIAGNOSIS — F17210 Nicotine dependence, cigarettes, uncomplicated: Secondary | ICD-10-CM | POA: Diagnosis present

## 2019-08-25 DIAGNOSIS — Z9119 Patient's noncompliance with other medical treatment and regimen: Secondary | ICD-10-CM

## 2019-08-25 DIAGNOSIS — Z20822 Contact with and (suspected) exposure to covid-19: Secondary | ICD-10-CM | POA: Diagnosis present

## 2019-08-25 DIAGNOSIS — F1410 Cocaine abuse, uncomplicated: Secondary | ICD-10-CM | POA: Diagnosis present

## 2019-08-25 DIAGNOSIS — Z79899 Other long term (current) drug therapy: Secondary | ICD-10-CM

## 2019-08-25 DIAGNOSIS — Z7289 Other problems related to lifestyle: Secondary | ICD-10-CM

## 2019-08-25 DIAGNOSIS — I2589 Other forms of chronic ischemic heart disease: Secondary | ICD-10-CM | POA: Diagnosis present

## 2019-08-25 DIAGNOSIS — G9529 Other cord compression: Secondary | ICD-10-CM | POA: Diagnosis present

## 2019-08-25 DIAGNOSIS — S01511S Laceration without foreign body of lip, sequela: Secondary | ICD-10-CM

## 2019-08-25 DIAGNOSIS — E66811 Obesity, class 1: Secondary | ICD-10-CM | POA: Diagnosis present

## 2019-08-25 DIAGNOSIS — R519 Headache, unspecified: Secondary | ICD-10-CM | POA: Diagnosis present

## 2019-08-25 DIAGNOSIS — I10 Essential (primary) hypertension: Secondary | ICD-10-CM | POA: Diagnosis present

## 2019-08-25 DIAGNOSIS — M25512 Pain in left shoulder: Secondary | ICD-10-CM | POA: Diagnosis present

## 2019-08-25 DIAGNOSIS — F17200 Nicotine dependence, unspecified, uncomplicated: Secondary | ICD-10-CM | POA: Diagnosis present

## 2019-08-25 DIAGNOSIS — S01511A Laceration without foreign body of lip, initial encounter: Secondary | ICD-10-CM | POA: Diagnosis present

## 2019-08-25 DIAGNOSIS — R531 Weakness: Secondary | ICD-10-CM | POA: Diagnosis present

## 2019-08-25 DIAGNOSIS — D72819 Decreased white blood cell count, unspecified: Secondary | ICD-10-CM

## 2019-08-25 DIAGNOSIS — E669 Obesity, unspecified: Secondary | ICD-10-CM | POA: Diagnosis present

## 2019-08-25 DIAGNOSIS — Z6831 Body mass index (BMI) 31.0-31.9, adult: Secondary | ICD-10-CM

## 2019-08-25 DIAGNOSIS — I110 Hypertensive heart disease with heart failure: Secondary | ICD-10-CM | POA: Diagnosis present

## 2019-08-25 DIAGNOSIS — R262 Difficulty in walking, not elsewhere classified: Secondary | ICD-10-CM | POA: Diagnosis present

## 2019-08-25 DIAGNOSIS — M79602 Pain in left arm: Secondary | ICD-10-CM | POA: Diagnosis present

## 2019-08-25 DIAGNOSIS — Z419 Encounter for procedure for purposes other than remedying health state, unspecified: Secondary | ICD-10-CM

## 2019-08-25 DIAGNOSIS — I2510 Atherosclerotic heart disease of native coronary artery without angina pectoris: Secondary | ICD-10-CM | POA: Diagnosis present

## 2019-08-25 HISTORY — DX: Chronic combined systolic (congestive) and diastolic (congestive) heart failure: I50.42

## 2019-08-25 NOTE — ED Triage Notes (Signed)
Pt c/o headache and lip swelling after a fall last night.

## 2019-08-26 ENCOUNTER — Encounter (HOSPITAL_COMMUNITY): Payer: Self-pay | Admitting: Internal Medicine

## 2019-08-26 ENCOUNTER — Emergency Department (HOSPITAL_COMMUNITY): Payer: Self-pay

## 2019-08-26 ENCOUNTER — Inpatient Hospital Stay (HOSPITAL_COMMUNITY): Payer: Self-pay

## 2019-08-26 DIAGNOSIS — S14129A Central cord syndrome at unspecified level of cervical spinal cord, initial encounter: Secondary | ICD-10-CM | POA: Diagnosis present

## 2019-08-26 DIAGNOSIS — E669 Obesity, unspecified: Secondary | ICD-10-CM | POA: Diagnosis present

## 2019-08-26 DIAGNOSIS — I5042 Chronic combined systolic (congestive) and diastolic (congestive) heart failure: Secondary | ICD-10-CM | POA: Diagnosis present

## 2019-08-26 DIAGNOSIS — F17200 Nicotine dependence, unspecified, uncomplicated: Secondary | ICD-10-CM | POA: Diagnosis present

## 2019-08-26 LAB — HEPATIC FUNCTION PANEL
ALT: 20 U/L (ref 0–44)
AST: 19 U/L (ref 15–41)
Albumin: 3.5 g/dL (ref 3.5–5.0)
Alkaline Phosphatase: 58 U/L (ref 38–126)
Bilirubin, Direct: 0.2 mg/dL (ref 0.0–0.2)
Indirect Bilirubin: 1.1 mg/dL — ABNORMAL HIGH (ref 0.3–0.9)
Total Bilirubin: 1.3 mg/dL — ABNORMAL HIGH (ref 0.3–1.2)
Total Protein: 6.2 g/dL — ABNORMAL LOW (ref 6.5–8.1)

## 2019-08-26 LAB — CBC WITH DIFFERENTIAL/PLATELET
Abs Immature Granulocytes: 0.02 10*3/uL (ref 0.00–0.07)
Basophils Absolute: 0 10*3/uL (ref 0.0–0.1)
Basophils Relative: 1 %
Eosinophils Absolute: 0.2 10*3/uL (ref 0.0–0.5)
Eosinophils Relative: 3 %
HCT: 50.3 % (ref 39.0–52.0)
Hemoglobin: 16.5 g/dL (ref 13.0–17.0)
Immature Granulocytes: 0 %
Lymphocytes Relative: 29 %
Lymphs Abs: 1.9 10*3/uL (ref 0.7–4.0)
MCH: 31.3 pg (ref 26.0–34.0)
MCHC: 32.8 g/dL (ref 30.0–36.0)
MCV: 95.3 fL (ref 80.0–100.0)
Monocytes Absolute: 0.7 10*3/uL (ref 0.1–1.0)
Monocytes Relative: 10 %
Neutro Abs: 3.7 10*3/uL (ref 1.7–7.7)
Neutrophils Relative %: 57 %
Platelets: 226 10*3/uL (ref 150–400)
RBC: 5.28 MIL/uL (ref 4.22–5.81)
RDW: 17.1 % — ABNORMAL HIGH (ref 11.5–15.5)
WBC: 6.5 10*3/uL (ref 4.0–10.5)
nRBC: 0 % (ref 0.0–0.2)

## 2019-08-26 LAB — BASIC METABOLIC PANEL
Anion gap: 9 (ref 5–15)
BUN: 17 mg/dL (ref 6–20)
CO2: 23 mmol/L (ref 22–32)
Calcium: 8.6 mg/dL — ABNORMAL LOW (ref 8.9–10.3)
Chloride: 108 mmol/L (ref 98–111)
Creatinine, Ser: 1.49 mg/dL — ABNORMAL HIGH (ref 0.61–1.24)
GFR calc Af Amer: >60 mL/min (ref >60)
GFR calc non Af Amer: 56 mL/min — ABNORMAL LOW (ref >60)
Glucose, Bld: 114 mg/dL — ABNORMAL HIGH (ref 70–99)
Potassium: 4.2 mmol/L (ref 3.5–5.1)
Sodium: 140 mmol/L (ref 135–145)

## 2019-08-26 LAB — RAPID URINE DRUG SCREEN, HOSP PERFORMED
Amphetamines: NOT DETECTED
Barbiturates: NOT DETECTED
Benzodiazepines: NOT DETECTED
Cocaine: POSITIVE — AB
Opiates: POSITIVE — AB
Tetrahydrocannabinol: POSITIVE — AB

## 2019-08-26 LAB — SARS CORONAVIRUS 2 BY RT PCR (HOSPITAL ORDER, PERFORMED IN ~~LOC~~ HOSPITAL LAB): SARS Coronavirus 2: NEGATIVE

## 2019-08-26 LAB — PROTIME-INR
INR: 1 (ref 0.8–1.2)
Prothrombin Time: 12.3 seconds (ref 11.4–15.2)

## 2019-08-26 LAB — ECHOCARDIOGRAM COMPLETE

## 2019-08-26 MED ORDER — OXYCODONE HCL 5 MG PO TABS
15.0000 mg | ORAL_TABLET | ORAL | Status: DC | PRN
  Administered 2019-08-26 – 2019-08-29 (×13): 15 mg via ORAL
  Filled 2019-08-26 (×13): qty 3

## 2019-08-26 MED ORDER — HYDROCODONE-ACETAMINOPHEN 5-325 MG PO TABS
2.0000 | ORAL_TABLET | Freq: Once | ORAL | Status: AC
  Administered 2019-08-26: 2 via ORAL
  Filled 2019-08-26: qty 2

## 2019-08-26 MED ORDER — AMLODIPINE BESYLATE 5 MG PO TABS
10.0000 mg | ORAL_TABLET | Freq: Once | ORAL | Status: AC
  Administered 2019-08-26: 10 mg via ORAL
  Filled 2019-08-26: qty 2

## 2019-08-26 MED ORDER — LISINOPRIL 5 MG PO TABS
5.0000 mg | ORAL_TABLET | Freq: Every day | ORAL | Status: DC
  Administered 2019-08-26 – 2019-08-28 (×3): 5 mg via ORAL
  Filled 2019-08-26 (×3): qty 1

## 2019-08-26 MED ORDER — LORAZEPAM 1 MG PO TABS
1.0000 mg | ORAL_TABLET | Freq: Once | ORAL | Status: AC
  Administered 2019-08-26: 1 mg via ORAL
  Filled 2019-08-26: qty 1

## 2019-08-26 MED ORDER — ONDANSETRON HCL 4 MG PO TABS: 4.0000 mg | ORAL_TABLET | Freq: Four times a day (QID) | ORAL | Status: DC | PRN

## 2019-08-26 MED ORDER — THIAMINE HCL 100 MG PO TABS
100.0000 mg | ORAL_TABLET | Freq: Every day | ORAL | Status: DC
  Administered 2019-08-26 – 2019-08-29 (×3): 100 mg via ORAL
  Filled 2019-08-26 (×3): qty 1

## 2019-08-26 MED ORDER — SODIUM CHLORIDE 0.9 % IV SOLN: INTRAVENOUS | Status: DC

## 2019-08-26 MED ORDER — HYDROMORPHONE HCL 1 MG/ML IJ SOLN
1.0000 mg | INTRAMUSCULAR | Status: DC | PRN
  Administered 2019-08-26 – 2019-08-29 (×9): 1 mg via INTRAVENOUS
  Filled 2019-08-26 (×9): qty 1

## 2019-08-26 MED ORDER — SODIUM CHLORIDE 0.9 % IV SOLN
250.0000 mL | INTRAVENOUS | Status: DC
  Administered 2019-08-26: 250 mL via INTRAVENOUS

## 2019-08-26 MED ORDER — DOCUSATE SODIUM 100 MG PO CAPS
100.0000 mg | ORAL_CAPSULE | Freq: Two times a day (BID) | ORAL | Status: DC
  Administered 2019-08-26 – 2019-08-29 (×6): 100 mg via ORAL
  Filled 2019-08-26 (×6): qty 1

## 2019-08-26 MED ORDER — AMOXICILLIN-POT CLAVULANATE 875-125 MG PO TABS: 1.0000 | ORAL_TABLET | Freq: Two times a day (BID) | ORAL | 0 refills | Status: DC

## 2019-08-26 MED ORDER — TETANUS-DIPHTH-ACELL PERTUSSIS 5-2.5-18.5 LF-MCG/0.5 IM SUSP
0.5000 mL | Freq: Once | INTRAMUSCULAR | Status: AC
  Administered 2019-08-26: 0.5 mL via INTRAMUSCULAR
  Filled 2019-08-26: qty 0.5

## 2019-08-26 MED ORDER — PHENOL 1.4 % MT LIQD
1.0000 | OROMUCOSAL | Status: DC | PRN
  Filled 2019-08-26 (×2): qty 177

## 2019-08-26 MED ORDER — ADULT MULTIVITAMIN W/MINERALS CH
1.0000 | ORAL_TABLET | Freq: Every day | ORAL | Status: DC
  Administered 2019-08-26 – 2019-08-29 (×4): 1 via ORAL
  Filled 2019-08-26 (×4): qty 1

## 2019-08-26 MED ORDER — NICOTINE 14 MG/24HR TD PT24
14.0000 mg | MEDICATED_PATCH | Freq: Every day | TRANSDERMAL | Status: DC
  Administered 2019-08-27: 14 mg via TRANSDERMAL
  Filled 2019-08-26 (×3): qty 1

## 2019-08-26 MED ORDER — SODIUM CHLORIDE 0.9% FLUSH
3.0000 mL | Freq: Two times a day (BID) | INTRAVENOUS | Status: DC
  Administered 2019-08-26 – 2019-08-29 (×6): 3 mL via INTRAVENOUS

## 2019-08-26 MED ORDER — ONDANSETRON HCL 4 MG/2ML IJ SOLN
4.0000 mg | Freq: Four times a day (QID) | INTRAMUSCULAR | Status: DC | PRN
  Administered 2019-08-26: 4 mg via INTRAVENOUS
  Filled 2019-08-26: qty 2

## 2019-08-26 MED ORDER — FOLIC ACID 1 MG PO TABS
1.0000 mg | ORAL_TABLET | Freq: Every day | ORAL | Status: DC
  Administered 2019-08-26 – 2019-08-29 (×4): 1 mg via ORAL
  Filled 2019-08-26 (×4): qty 1

## 2019-08-26 MED ORDER — MENTHOL 3 MG MT LOZG
1.0000 | LOZENGE | OROMUCOSAL | Status: DC | PRN
  Filled 2019-08-26 (×2): qty 9

## 2019-08-26 MED ORDER — POLYETHYLENE GLYCOL 3350 17 G PO PACK: 17.0000 g | PACK | Freq: Every day | ORAL | Status: DC | PRN

## 2019-08-26 MED ORDER — OXYCODONE HCL 5 MG PO TABS: 10.0000 mg | ORAL_TABLET | ORAL | Status: DC | PRN

## 2019-08-26 MED ORDER — SODIUM CHLORIDE 0.9% FLUSH: 3.0000 mL | INTRAVENOUS | Status: DC | PRN

## 2019-08-26 MED ORDER — FENTANYL CITRATE (PF) 100 MCG/2ML IJ SOLN
50.0000 ug | Freq: Once | INTRAMUSCULAR | Status: AC
  Administered 2019-08-26: 50 ug via INTRAVENOUS
  Filled 2019-08-26: qty 2

## 2019-08-26 MED ORDER — LORAZEPAM 1 MG PO TABS
1.0000 mg | ORAL_TABLET | ORAL | Status: AC | PRN
  Administered 2019-08-28: 2 mg via ORAL
  Filled 2019-08-26: qty 2

## 2019-08-26 MED ORDER — AMLODIPINE BESYLATE 10 MG PO TABS
10.0000 mg | ORAL_TABLET | Freq: Every day | ORAL | Status: DC
  Administered 2019-08-26 – 2019-08-29 (×4): 10 mg via ORAL
  Filled 2019-08-26 (×2): qty 1
  Filled 2019-08-26: qty 2
  Filled 2019-08-26: qty 1

## 2019-08-26 MED ORDER — LORAZEPAM 2 MG/ML IJ SOLN
1.0000 mg | INTRAMUSCULAR | Status: AC | PRN
  Administered 2019-08-27: 1 mg via INTRAVENOUS
  Administered 2019-08-28: 2 mg via INTRAVENOUS
  Filled 2019-08-26 (×2): qty 1

## 2019-08-26 MED ORDER — GABAPENTIN 300 MG PO CAPS
300.0000 mg | ORAL_CAPSULE | Freq: Three times a day (TID) | ORAL | Status: DC
  Administered 2019-08-26 – 2019-08-29 (×9): 300 mg via ORAL
  Filled 2019-08-26 (×9): qty 1

## 2019-08-26 MED ORDER — ACETAMINOPHEN 325 MG PO TABS
650.0000 mg | ORAL_TABLET | ORAL | Status: DC | PRN
  Administered 2019-08-27 – 2019-08-29 (×2): 650 mg via ORAL
  Filled 2019-08-26 (×2): qty 2

## 2019-08-26 MED ORDER — ACETAMINOPHEN 650 MG RE SUPP: 650.0000 mg | RECTAL | Status: DC | PRN

## 2019-08-26 MED ORDER — THIAMINE HCL 100 MG/ML IJ SOLN
100.0000 mg | Freq: Every day | INTRAMUSCULAR | Status: DC
  Administered 2019-08-27: 100 mg via INTRAVENOUS
  Filled 2019-08-26: qty 2

## 2019-08-26 NOTE — Progress Notes (Signed)
  Echocardiogram 2D Echocardiogram has been performed.  Tim [name] 08/26/2019, 3:04 PM

## 2019-08-26 NOTE — ED Provider Notes (Addendum)
Patient signed out to me awaiting MRI of his head neck and lumbar spine after a fall last night.  Has left upper arm weakness.  History of polysubstance abuse.  Appears that is likely intoxicated during this fall last night.  He has small tear to his lip.  CT imaging and x-ray imaging of his head and arms are unremarkable.  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  Likely cord compression at this area as well.  Likely has some edema around T2 as well.  Patient does have some mild right hand weakness with weak handgrip on the right but has more severe weakness in left hand grip associated with pain as well.  Sensation appears intact.  Dr. Johnsie Cancel with neurosurgery was consulted and will admit the patient for surgical intervention given cord compression and neuro deficit..  Patient was given IV fentanyl for further pain control.  Patient remains in cervical collar.  Hospitalist consulted to comanage patient chronic medical problems.  To be admitted by neurosurgery.  This chart was dictated using voice recognition software.  Despite best efforts to proofread,  errors can occur which can change the documentation meaning.  .Critical Care Performed by: Virgina Norfolk, DO Authorized by: Virgina Norfolk, DO   Critical care provider statement:    Critical care time (minutes):  35   Critical care was necessary to treat or prevent imminent or life-threatening deterioration of the following conditions:  Trauma   Critical care was time spent personally by me on the following activities:  Blood draw for specimens, development of treatment plan with patient or surrogate, discussions with primary provider, examination of patient, evaluation of patient's response to treatment, obtaining history from patient or surrogate, ordering and review of laboratory studies, ordering and performing treatments  and interventions, ordering and review of radiographic studies, pulse oximetry, re-evaluation of patient's condition and review of old charts   I assumed direction of critical care for this patient from another provider in my specialty: no          Virgina Norfolk, DO 08/26/19 0855    Virgina Norfolk, DO 08/26/19 2751    Virgina Norfolk, DO 08/26/19 1034

## 2019-08-26 NOTE — Progress Notes (Signed)
CSW spoke with patient to discuss his alcohol intake prior to the fall that brought him into the ED. Patient reports he rarely drinks, stating less than once a month. Patient denies the need for resources and has no questions for CSW at this time. CSW encouraged patient to cease drinking and driving completely - stated understanding and stated he is "done drinking for good." CSW informed patient how to reach out to [facility] department if needs arise.  Edwin Dada, MSW, LCSW-A Transitions of Care  Clinical Social Worker  [facility] Emergency Departments  Medical ICU [phone]

## 2019-08-26 NOTE — Consult Note (Addendum)
Consult Note   Tim [name] MWU:[id number] DOB: 1975/08/09 DOA: 08/25/2019  PCP: Patient, No Pcp Per Consultants:  [name] - cardiology Patient coming from:  Home - lives alone; NOK: Girlfriend, Fanny Bien, [phone]  Chief Complaint: Fall  HPI: Tim [name] is a 44 y.o. 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  Patient reports that he was leaving a party and a little tipsy.  He tried to open his car door, can only get in through the back seat and he fell maybe three times.  He finally sat down until he "got right" and went home and came to the ER the next day.  He is having numbness/tingling on his left arms and fingers, hardly able to lift a pencil.  Also difficulty walking and toes tingling.  He was not having symptoms prior to the fall.   When we discussed not driving while intoxicated, he assured me that the "car drove itself home."     ED Course: Polysubstance abuse, h/o EF 20%.  Needs medical clearance pre-operatively.  Severe C4-5 stenosis, to OR tomorrow.  Review of Systems: As per HPI; otherwise review of systems reviewed and negative.   Ambulatory Status:  Ambulates without assistance  COVID Vaccine Status:  None  Past Medical History:  Diagnosis Date  . Asthma   . Chronic combined systolic (congestive) and diastolic (congestive) heart failure (HCC)   . Coronary artery disease   . Hypertension   . Noncompliance   . Polysubstance abuse [facility])     Past Surgical History:  Procedure Laterality Date  . TRANSTHORACIC ECHOCARDIOGRAM  09/23/2017   EF 20 to 25% with diffuse hypokinesis.  GR 1 DD.  Kinesis was more noted in the anterior and anterolateral wall  . WISDOM TOOTH EXTRACTION      Social History   Socioeconomic History  . Marital status: Single    Spouse name: Not on file  . Number of  children: Not on file  . Years of education: Not on file  . Highest education level: Not on file  Occupational History  . Occupation: Administrator, Civil Service  Tobacco Use  . Smoking status: Current Every Day Smoker    Packs/day: 1.00    Years: 29.00    Pack years: 29.00    Types: Cigarettes  . Smokeless tobacco: Never Used  Substance and Sexual Activity  . Alcohol use: Yes    Comment: occassionally, intermittent heavy drinking  . Drug use: Not Currently    Types: Cocaine, Marijuana    Comment: "barely" uses marijuana, denies cocaine (prior UDS + cocaine and opiates  . Sexual activity: Yes  Other Topics Concern  . Not on file  Social History Narrative   Previously incarcerated   Single with multiple children (~8).     Off & on Smoker - 19 yrs ~1/2 - 1 PPD   Currently unemployed - having a hard time finding a "good fit".   He currently lives with his brother & is working on his GED.   Admits to Cocaine (not Crack) use.   Social Determinants of Health   Financial Resource Strain:   . Difficulty of Paying Living Expenses:   Food Insecurity:   . Worried About Programme researcher, broadcasting/film/video in the Last Year:   . Barista in the Last Year:   Transportation Needs:   .  Lack of Transportation (Medical):   Marland Kitchen Lack of Transportation (Non-Medical):   Physical Activity:   . Days of Exercise per Week:   . Minutes of Exercise per Session:   Stress:   . Feeling of Stress :   Social Connections:   . Frequency of Communication with Friends and Family:   . Frequency of Social Gatherings with Friends and Family:   . Attends Religious Services:   . Active Member of Clubs or Organizations:   . Attends Banker Meetings:   Marland Kitchen Marital Status:   Intimate Partner Violence:   . Fear of Current or Ex-Partner:   . Emotionally Abused:   Marland Kitchen Physically Abused:   . Sexually Abused:     No Known Allergies  Family History  Problem Relation Age of Onset  . Hypertension Mother     Prior to  Admission medications   Medication Sig Start Date End Date Taking? Authorizing Provider  amLODipine (NORVASC) 10 MG tablet Take 1 tablet (10 mg total) by mouth daily. 03/30/19  Yes Elvina Sidle, MD  lisinopril (ZESTRIL) 5 MG tablet Take 1 tablet (5 mg total) by mouth daily. 05/10/19 08/25/28 Yes Darr, Veryl Speak, PA-C  amoxicillin-clavulanate (AUGMENTIN) 875-125 MG tablet Take 1 tablet by mouth every 12 (twelve) hours. 08/26/19   Rancour, Jeannett Senior, MD  clotrimazole (LOTRIMIN) 1 % cream Apply to affected area 2 times daily Patient not taking: Reported on 08/26/2019 05/10/19   Darr, Veryl Speak, PA-C  HYDROcodone-acetaminophen (NORCO) 5-325 MG tablet Take 1 tablet by mouth every 6 (six) hours as needed for moderate pain. Patient not taking: Reported on 08/26/2019 03/30/19   Elvina Sidle, MD  lidocaine (XYLOCAINE) 2 % solution Use as directed 10 mLs in the mouth or throat every 6 (six) hours as needed for mouth pain. Hold in mouth and spit. Do not swallow. Patient not taking: Reported on 08/26/2019 06/12/18   Domenick Gong, MD  naproxen (NAPROSYN) 500 MG tablet Take 1 tablet (500 mg total) by mouth 2 (two) times daily. Patient not taking: Reported on 08/26/2019 05/10/19   Darr, Veryl Speak, PA-C    Physical Exam: Vitals:   08/26/19 0600 08/26/19 0630 08/26/19 0932 08/26/19 1000  BP: (!) 156/104 (!) 158/104 (!) 165/115 (!) 133/104  Pulse: 64 67 62 67  Resp:      Temp:      TempSrc:      SpO2: 97% 95% 97% 98%     . General:  Appears calm and comfortable and is NAD . Eyes:  PERRL, EOMI, normal lids, iris . ENT:  grossly normal hearing, tongue, mmm; hemostatic laceration along internal lower labia at about midline, already with evidence of healing mucosa . Neck:  Hard C-collar in place . Cardiovascular:  RRR, no m/r/g. No LE edema.  Marland Kitchen Respiratory:   CTA bilaterally with no wheezes/rales/rhonchi.  Normal respiratory effort. . Abdomen:  soft, NT, ND, NABS . Skin:  no rash or induration seen on limited  exam . Musculoskeletal:  3-4/5 LUE strength, significantly diminished grip strength; some strength testing appeared to be diminished exertionally but LUE appeared to be consistently diminished . Psychiatric:  grossly normal mood and affect, speech fluent and appropriate, AOx3 . Neurologic:  CN 2-12 grossly intact, moves all extremities in coordinated fashion    Radiological Exams on Admission: DG Wrist Complete Left  Result Date: 08/26/2019 CLINICAL DATA:  Fall EXAM: LEFT WRIST - COMPLETE 3+ VIEW COMPARISON:  None. FINDINGS: There is no evidence of fracture or dislocation. 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 Mild dorsal soft tissue swelling is seen. IMPRESSION: No acute osseous abnormality. Electronically Signed   By: Jonna Clark M.D.   On: 08/26/2019 03:36   CT Head Wo Contrast  Result Date: 08/26/2019 CLINICAL DATA:  Fall EXAM: CT HEAD WITHOUT CONTRAST CT MAXILLOFACIAL WITHOUT CONTRAST CT CERVICAL SPINE WITHOUT CONTRAST TECHNIQUE: Multidetector CT imaging of the head, cervical spine, and maxillofacial structures were performed using the standard protocol without intravenous contrast. Multiplanar CT image reconstructions of the cervical spine and maxillofacial structures were also generated. COMPARISON:  None. FINDINGS: CT HEAD FINDINGS Brain: There is no mass, hemorrhage or extra-axial collection. The size and configuration of the ventricles and extra-axial CSF spaces are normal. The brain parenchyma is normal, without evidence of acute or chronic infarction. Vascular: No abnormal hyperdensity of the major intracranial arteries or dural venous sinuses. No intracranial atherosclerosis. Skull: The visualized skull base, calvarium and extracranial soft tissues are normal. CT MAXILLOFACIAL FINDINGS Osseous: --Complex facial fracture types: No LeFort, zygomaticomaxillary complex or nasoorbitoethmoidal fracture. --Simple fracture types: None.  --Mandible: No fracture or dislocation. Orbits: The globes are intact. Normal appearance of the intra- and extraconal fat. Symmetric extraocular muscles and optic nerves. Sinuses: No fluid levels or advanced mucosal thickening. Soft tissues: Normal visualized extracranial soft tissues. CT CERVICAL SPINE FINDINGS Alignment: No static subluxation. Facets are aligned. Occipital condyles and the lateral masses of C1-C2 are aligned. Skull base and vertebrae: No acute fracture. Soft tissues and spinal canal: No prevertebral fluid or swelling. No visible canal hematoma. Disc levels: Multilevel uncovertebral hypertrophy causing bilateral neural foraminal stenosis at C3-4, C4-5 and severe left foraminal stenosis at C6-7. No bony spinal canal stenosis. Upper chest: No pneumothorax, pulmonary nodule or pleural effusion. Other: Normal visualized paraspinal cervical soft tissues. IMPRESSION: 1. No acute intracranial abnormality. 2. No facial or skull fracture. 3. No acute fracture or static subluxation of the cervical spine. Electronically Signed   By: Deatra Robinson M.D.   On: 08/26/2019 03:44   CT Cervical Spine Wo Contrast  Result Date: 08/26/2019 CLINICAL DATA:  Fall EXAM: CT HEAD WITHOUT CONTRAST CT MAXILLOFACIAL WITHOUT CONTRAST CT CERVICAL SPINE WITHOUT CONTRAST TECHNIQUE: Multidetector CT imaging of the head, cervical spine, and maxillofacial structures were performed using the standard protocol without intravenous contrast. Multiplanar CT image reconstructions of the cervical spine and maxillofacial structures were also generated. COMPARISON:  None. FINDINGS: CT HEAD FINDINGS Brain: There is no mass, hemorrhage or extra-axial collection. The size and configuration of the ventricles and extra-axial CSF spaces are normal. The brain parenchyma is normal, without evidence of acute or chronic infarction. Vascular: No abnormal hyperdensity of the major intracranial arteries or dural venous sinuses. No intracranial  atherosclerosis. Skull: The visualized skull base, calvarium and extracranial soft tissues are normal. CT MAXILLOFACIAL FINDINGS Osseous: --Complex facial fracture types: No LeFort, zygomaticomaxillary complex or nasoorbitoethmoidal fracture. --Simple fracture types: None. --Mandible: No fracture or dislocation. Orbits: The globes are intact. Normal appearance of the intra- and extraconal fat. Symmetric extraocular muscles and optic nerves. Sinuses: No fluid levels or advanced mucosal thickening. Soft tissues: Normal visualized extracranial soft tissues. CT CERVICAL SPINE FINDINGS Alignment: No static subluxation. Facets are aligned. Occipital condyles and the lateral masses of C1-C2 are aligned. Skull base and vertebrae: No acute fracture. Soft tissues and spinal canal: No prevertebral fluid or swelling. No visible canal hematoma. Disc levels: Multilevel uncovertebral hypertrophy causing bilateral neural foraminal stenosis at C3-4, C4-5 and severe left  foraminal stenosis at C6-7. No bony spinal canal stenosis. Upper chest: No pneumothorax, pulmonary nodule or pleural effusion. Other: Normal visualized paraspinal cervical soft tissues. IMPRESSION: 1. No acute intracranial abnormality. 2. No facial or skull fracture. 3. No acute fracture or static subluxation of the cervical spine. Electronically Signed   By: Deatra RobinsonKevin  Herman M.D.   On: 08/26/2019 03:44   MR BRAIN WO CONTRAST  Result Date: 08/26/2019 CLINICAL DATA:  Rule out stroke.  Right-sided weakness.  Fall. EXAM: MRI HEAD WITHOUT CONTRAST TECHNIQUE: Multiplanar, multiecho pulse sequences of the brain and surrounding structures were obtained without intravenous contrast. COMPARISON:  CT head 08/26/2019 FINDINGS: Brain: No acute infarction, hemorrhage, hydrocephalus, extra-axial collection or mass lesion. Scattered small deep white matter hyperintensities bilaterally. Brainstem and cerebellum intact. Vascular: Normal arterial flow voids Skull and upper cervical  spine: No focal skeletal lesion. Sinuses/Orbits: Mild mucosal edema paranasal sinuses.  Normal orbit. Other: None IMPRESSION: No acute abnormality. Chronic microvascular ischemic type changes in the white matter. Electronically Signed   By: Marlan Palauharles  Clark M.D.   On: 08/26/2019 08:13   MR Cervical Spine Wo Contrast  Result Date: 08/26/2019 CLINICAL DATA:  Fall, left arm weakness EXAM: MRI CERVICAL SPINE WITHOUT CONTRAST TECHNIQUE: Multiplanar, multisequence MR imaging of the cervical spine was performed. No intravenous contrast was administered. COMPARISON:  None. FINDINGS: Alignment: Mild retrolisthesis at C3-C4 and C4-C5. Vertebrae: Vertebral body heights are maintained. There is no significant marrow edema or suspicious osseous lesion Cord: There is abnormal cord T2 hyperintensity at the C4 and C4-C5 level. Posterior Fossa, vertebral arteries, paraspinal tissues: Unremarkable. Disc levels: C2-C3: Left facet hypertrophy. No significant canal or foraminal stenosis. C3-C4: Disc bulge, endplate osteophytes, and uncovertebral hypertrophy. Mild to moderate canal stenosis. Marked foraminal stenosis. C4-C5: Disc, endplate osteophytes, and uncovertebral hypertrophy. Marked canal stenosis with cord compression. Marked foraminal stenosis. C5-C6: Disc bulge, endplate osteophytes, and uncovertebral hypertrophy. Mild canal stenosis. Moderate right and marked left foraminal stenosis. C6-C7: Disc bulge, endplate osteophytes, and uncovertebral hypertrophy. Mild canal stenosis. Moderate right foraminal stenosis. Marked left foraminal stenosis. C7-T1: Disc bulge, endplate osteophytes, and facet hypertrophy. No significant canal or foraminal stenosis. IMPRESSION: Multilevel degenerative changes as detailed above. There is severe canal stenosis at C4-C5 with cord compression. Cord T2 hyperintensity at this level may reflect edema or myelomalacia. There is multilevel significant foraminal stenosis. Findings were provided on  08/26/2019 at 8:20 a.m. Electronically Signed   By: Guadlupe SpanishPraneil  Patel M.D.   On: 08/26/2019 08:23   MR LUMBAR SPINE WO CONTRAST  Result Date: 08/26/2019 CLINICAL DATA:  Poly trauma.  Right-sided weakness.  Fall. EXAM: MRI LUMBAR SPINE WITHOUT CONTRAST TECHNIQUE: Multiplanar, multisequence MR imaging of the lumbar spine was performed. No intravenous contrast was administered. COMPARISON:  None. FINDINGS: Segmentation:  Normal Alignment:  Mild retrolisthesis L4-5.  Otherwise normal alignment Vertebrae:  Normal bone marrow.  Negative for fracture or mass. Conus medullaris and cauda equina: Conus extends to the L1-2 level. Conus and cauda equina appear normal. Paraspinal and other soft tissues: Negative for paraspinous mass or adenopathy. Disc levels: L1-2: Negative L2-3: Negative L3-4: Mild disc degeneration with disc space narrowing. Shallow disc protrusion on the left in the foramen and extraforaminal region. No significant neural impingement. Mild facet degeneration. Spinal canal normal in size. L4-5: Disc degeneration and disc space narrowing. Mild retrolisthesis. Shallow left foraminal disc protrusion with mild left foraminal narrowing. No compression of the left L4 nerve root. Bilateral facet degeneration. Shallow right foraminal disc protrusion with mild right foraminal stenosis.  L5-S1: Bilateral facet degeneration right greater than left. Mild subarticular stenosis on the right. IMPRESSION: Shallow left foraminal disc protrusion L3-4 without neural impingement Shallow foraminal disc protrusion bilaterally L4-5, left greater than right. Mild subarticular stenosis on the right at L5-S1 due to facet hypertrophy. Electronically Signed   By: Marlan Palau M.D.   On: 08/26/2019 08:17   DG Chest Portable 1 View  Result Date: 08/26/2019 CLINICAL DATA:  Fall EXAM: PORTABLE CHEST 1 VIEW COMPARISON:  June 18, 2003 FINDINGS: The heart size and mediastinal contours are within normal limits. Both lungs are clear. The  visualized skeletal structures are unremarkable. IMPRESSION: No active disease. Electronically Signed   By: Jonna Clark M.D.   On: 08/26/2019 03:35   DG Shoulder Left  Result Date: 08/26/2019 CLINICAL DATA:  Fall EXAM: LEFT SHOULDER - 2+ VIEW COMPARISON:  None. FINDINGS: No fracture is seen. The transscapular Y-view is limited due to technique. There is slight contour abnormality of the posterolateral corner of the humeral head which could be from a prior Hill-Sachs deformity. No overlying soft tissue swelling. IMPRESSION: No definite acute osseous fracture. Limited transscapular Y-view due to technique. Electronically Signed   By: Jonna Clark M.D.   On: 08/26/2019 03:39   CT Maxillofacial Wo Contrast  Result Date: 08/26/2019 CLINICAL DATA:  Fall EXAM: CT HEAD WITHOUT CONTRAST CT MAXILLOFACIAL WITHOUT CONTRAST CT CERVICAL SPINE WITHOUT CONTRAST TECHNIQUE: Multidetector CT imaging of the head, cervical spine, and maxillofacial structures were performed using the standard protocol without intravenous contrast. Multiplanar CT image reconstructions of the cervical spine and maxillofacial structures were also generated. COMPARISON:  None. FINDINGS: CT HEAD FINDINGS Brain: There is no mass, hemorrhage or extra-axial collection. The size and configuration of the ventricles and extra-axial CSF spaces are normal. The brain parenchyma is normal, without evidence of acute or chronic infarction. Vascular: No abnormal hyperdensity of the major intracranial arteries or dural venous sinuses. No intracranial atherosclerosis. Skull: The visualized skull base, calvarium and extracranial soft tissues are normal. CT MAXILLOFACIAL FINDINGS Osseous: --Complex facial fracture types: No LeFort, zygomaticomaxillary complex or nasoorbitoethmoidal fracture. --Simple fracture types: None. --Mandible: No fracture or dislocation. Orbits: The globes are intact. Normal appearance of the intra- and extraconal fat. Symmetric extraocular  muscles and optic nerves. Sinuses: No fluid levels or advanced mucosal thickening. Soft tissues: Normal visualized extracranial soft tissues. CT CERVICAL SPINE FINDINGS Alignment: No static subluxation. Facets are aligned. Occipital condyles and the lateral masses of C1-C2 are aligned. Skull base and vertebrae: No acute fracture. Soft tissues and spinal canal: No prevertebral fluid or swelling. No visible canal hematoma. Disc levels: Multilevel uncovertebral hypertrophy causing bilateral neural foraminal stenosis at C3-4, C4-5 and severe left foraminal stenosis at C6-7. No bony spinal canal stenosis. Upper chest: No pneumothorax, pulmonary nodule or pleural effusion. Other: Normal visualized paraspinal cervical soft tissues. IMPRESSION: 1. No acute intracranial abnormality. 2. No facial or skull fracture. 3. No acute fracture or static subluxation of the cervical spine. Electronically Signed   By: Deatra Robinson M.D.   On: 08/26/2019 03:44    EKG: pending   Labs on Admission: I have personally reviewed the available labs and imaging studies at the time of the admission.  Pertinent labs:   Glucose 114 BUN 17/Creatinine 1.49/GFR >60 Unremarkable CBC COVID pending   Assessment/Plan Principal Problem:   Central cord syndrome [facility]) Active Problems:   Essential hypertension   Polysubstance abuse (HCC)   Chronic combined systolic (congestive) and diastolic (congestive) heart failure (HCC)  Obesity (BMI 30.0-34.9)   Tobacco dependence   Cervical stenosis with central cord syndrome -Moderate C3-4 stenosis with severe C4-5 stenosis with all symptoms occurring after a fall -Exam is c/w MRI findings, primarily LUE symptoms -Dr. Zada Finders is admitting and plans to take to the OR tomorrow for C4-5 decompression -He has been started on Neurontin for dysesthetic pain  Polysubstance abuse -Patient acknowledges intermittent ETOH use (denies heavy use, described himself as "a little tipsy" on the night  he fell 3 times trying to get into his car to drive home from a party so appears to have impaired judgement about this), occasional marijuana use (reluctantly admits), denies cocaine -UDS, LFTs, INR pending -Needs ongoing substance abuse counseling -Will order CIWA  Chronic combined CHF -Patient with h/o cocaine-induced cardiac ischemia and cardiomyopathy in 2018, with brief episode of CPR -Echo with EF 25-30% in 09/2016 -Repeat echo in 09/2017 with EF 20-25% and grade 1 diastolic dysfunction -He was last seen by cardiology on 6/26 with plan for Myoview stress testing to exclude ischemic CAD and to reassess EF; he did not have this performed -For now, will obtain Echo (he is having this done right now) -If EF remains depressed, will obtain cardiology consult for clearance prior to surgery  HTN -Continue Norvasc, Lisinopril  Obesity -BMI 31.7 -Weight loss should be encouraged  Tobacco dependence -Encourage cessation.   -This was discussed with the patient and should be reviewed on an ongoing basis.   -Patch ordered at patient request.    Note: This patient has been tested and is pending for the novel coronavirus COVID-19.   Thank you for this interesting consult.  [facility] will continue to follow the patient at this time.      Karmen Bongo MD [facility]   How to contact the [facility] Attending or Consulting provider [place] or covering provider during after hours [place], for this patient?  1. Check the care team in [facility] and look for a) attending/consulting [facility] provider listed and b) the [facility] team listed 2. Log into [email or web address] and use [place]'s universal password to access. If you do not have the password, please contact the hospital operator. 3. Locate the [facility] provider you are looking for under [facility] and page to a number that you can be directly reached. 4. If you still have difficulty reaching the provider, please page the [facility] (Director on Call) for the  Hospitalists listed on amion for assistance.   08/26/2019, 11:11 AM

## 2019-08-26 NOTE — ED Notes (Signed)
Report given 4N RN. All questions answered

## 2019-08-26 NOTE — Anesthesia Preprocedure Evaluation (Addendum)
Anesthesia Evaluation  Patient identified by MRN, date of birth, ID band Patient awake    Reviewed: Allergy & Precautions, NPO status , Patient's Chart, lab work & pertinent test results  Airway Mallampati: III  TM Distance: >3 FB Neck ROM: Full    Dental no notable dental hx.    Pulmonary asthma , neg recent URI, Current Smoker and Patient abstained from smoking.,    Pulmonary exam normal breath sounds clear to auscultation       Cardiovascular hypertension, Pt. on medications + CAD and +CHF  Normal cardiovascular exam Rhythm:Regular Rate:Normal  ECG: rate 61. Sinus rhythm Consider left atrial enlargement Probable LVH with secondary repol abnrm  ECHO: 1. Left ventricular ejection fraction, by estimation, is 50 to 55%. The left ventricle has low normal function. The left ventricle has no regional wall motion abnormalities. Left ventricular diastolic parameters were normal. 2. Right ventricular systolic function is normal. The right ventricular size is normal. Tricuspid regurgitation signal is inadequate for assessing PA pressure. 3. The mitral valve is normal in structure. Trivial mitral valve regurgitation. No evidence of mitral stenosis. 4. The aortic valve is grossly normal. Aortic valve regurgitation is not visualized. No aortic stenosis is present. 5. The inferior vena cava is normal in size with greater than 50% respiratory variability, suggesting right atrial pressure of 3 mmHg.   Neuro/Psych Central Cord Syndrome Location: [facility] OR ROOM 20 / [facility] OR    C-spine not cleared negative psych ROS   GI/Hepatic negative GI ROS, (+)     substance abuse  cocaine use and marijuana use,   Endo/Other  negative endocrine ROS  Renal/GU negative Renal ROS     Musculoskeletal negative musculoskeletal ROS (+) narcotic dependent  Abdominal   Peds  Hematology negative hematology ROS (+)   Anesthesia Other  Findings Central Cord Syndrome  Reproductive/Obstetrics                          Anesthesia Physical Anesthesia Plan  ASA: III  Anesthesia Plan: General   Post-op Pain Management:    Induction: Intravenous  PONV Risk Score and Plan: 1 and Ondansetron, Dexamethasone, Midazolam and Treatment may vary due to age or medical condition  Airway Management Planned: Oral ETT and Video Laryngoscope Planned  Additional Equipment:   Intra-op Plan:   Post-operative Plan: Extubation in OR  Informed Consent: I have reviewed the patients History and Physical, chart, labs and discussed the procedure including the risks, benefits and alternatives for the proposed anesthesia with the patient or authorized representative who has indicated his/her understanding and acceptance.     Dental advisory given  Plan Discussed with: CRNA  Anesthesia Plan Comments:        Anesthesia Quick Evaluation

## 2019-08-26 NOTE — Progress Notes (Signed)
Patient came to the floor at 615 pm and needed pain medicine and food. Oxy and menu given to the patient.

## 2019-08-26 NOTE — H&P (Signed)
Neurosurgery H&P  CC: Central cord syndrome  HPI: This is a 44 y.o. man that presents after a fall, +h/o polysubstance abuse, striking his face. He has had difficulty ambulating and left sided weakness with left sided burning extremity pain since that time. Has not had Sx like these before, no prior h/o worsening neurologic function. No recent use of anti-platelet or anti-coagulant medications, +Hx of DCM with EF of 20% per chart review.   ROS: A 14 point ROS was performed and is negative except as noted in the HPI.   PMHx:  Past Medical History:  Diagnosis Date  . Asthma   . Coronary artery disease   . Hypertension   . Noncompliance   . Polysubstance abuse (HCC)    FamHx:  Family History  Problem Relation Age of Onset  . Hypertension Mother    SocHx:  reports that he has been smoking cigarettes. He has been smoking about 1.00 pack per day. He has never used smokeless tobacco. He reports previous alcohol use. He reports previous drug use. Drug: Cocaine.  Exam: Vital signs in last 24 hours: Temp:  [99.4 F (37.4 C)] 99.4 F (37.4 C) (05/16 2236) Pulse Rate:  [64-95] 67 (05/17 0630) Resp:  [18] 18 (05/16 2236) BP: (156-170)/(90-104) 158/104 (05/17 0630) SpO2:  [95 %-97 %] 95 % (05/17 0630) General: Awake, alert, cooperative, lying in bed in NAD Head: Normocephalic, multiple tattoos HEENT: In trauma collar Pulmonary: breathing room air comfortably, no evidence of increased work of breathing Cardiac: RRR Abdomen: S NT ND Extremities: Warm and well perfused x4 Neuro: AOx3, PERRL, EOMI, FS Strength 5/5 on R, 4-/5 on L with numbness and dysesthetic pain to cutaneous touch, +Hoffman's bilaterally   Assessment and Plan: 44 y.o. man s/p fall with central cord syndrome, predominantly on the left with signs of myelopathy on exam. MRI C-spine personally reviewed, which shows mild to moderate C3-4 stenosis and severe C4-5 stenosis with cord signal change.   -discussed the above with  the patient, explained the pathology / pathophysiology, will need surgical decompression. Will admit and take to the OR tomorrow for C4-5 ACDF. -regular diet now, NPO p MN tonight with IVF -start gabapentin 300tid for dysesthetic pain  Jadene Pierini, MD 08/26/19 9:17 AM [facility]
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
## 2019-08-27 ENCOUNTER — Encounter (HOSPITAL_COMMUNITY): Payer: Self-pay | Admitting: Neurological Surgery

## 2019-08-27 ENCOUNTER — Encounter (HOSPITAL_COMMUNITY)
Admission: EM | Disposition: A | Discharge status: Home / Self Care | Payer: Self-pay | Source: Home / Self Care | Attending: Neurological Surgery

## 2019-08-27 ENCOUNTER — Inpatient Hospital Stay (HOSPITAL_COMMUNITY): Payer: Self-pay | Admitting: Anesthesiology

## 2019-08-27 ENCOUNTER — Inpatient Hospital Stay (HOSPITAL_COMMUNITY): Payer: Self-pay

## 2019-08-27 HISTORY — PX: ANTERIOR CERVICAL DECOMP/DISCECTOMY FUSION: SHX1161

## 2019-08-27 SURGERY — ANTERIOR CERVICAL DECOMPRESSION/DISCECTOMY FUSION 1 LEVEL
Anesthesia: General | Site: Neck

## 2019-08-27 MED ORDER — 0.9 % SODIUM CHLORIDE (POUR BTL) OPTIME
TOPICAL | Status: DC | PRN
  Administered 2019-08-27: 1000 mL

## 2019-08-27 MED ORDER — THROMBIN 5000 UNITS EX SOLR: OROMUCOSAL | Status: DC | PRN

## 2019-08-27 MED ORDER — PROPOFOL 10 MG/ML IV BOLUS
INTRAVENOUS | Status: DC | PRN
  Administered 2019-08-27: 120 mg via INTRAVENOUS

## 2019-08-27 MED ORDER — SODIUM CHLORIDE 0.9 % IV SOLN
INTRAVENOUS | Status: DC | PRN
  Administered 2019-08-27: 500 mL

## 2019-08-27 MED ORDER — FENTANYL CITRATE (PF) 250 MCG/5ML IJ SOLN
INTRAMUSCULAR | Status: AC
  Filled 2019-08-27: qty 5

## 2019-08-27 MED ORDER — LIDOCAINE 2% (20 MG/ML) 5 ML SYRINGE
INTRAMUSCULAR | Status: AC
  Filled 2019-08-27: qty 5

## 2019-08-27 MED ORDER — LIDOCAINE-EPINEPHRINE 1 %-1:100000 IJ SOLN
INTRAMUSCULAR | Status: DC | PRN
  Administered 2019-08-27: 8 mL

## 2019-08-27 MED ORDER — LIDOCAINE 2% (20 MG/ML) 5 ML SYRINGE
INTRAMUSCULAR | Status: DC | PRN
  Administered 2019-08-27: 60 mg via INTRAVENOUS

## 2019-08-27 MED ORDER — OXYCODONE HCL 5 MG PO TABS: 5.0000 mg | ORAL_TABLET | Freq: Once | ORAL | Status: DC | PRN

## 2019-08-27 MED ORDER — ROCURONIUM BROMIDE 10 MG/ML (PF) SYRINGE
PREFILLED_SYRINGE | INTRAVENOUS | Status: AC
  Filled 2019-08-27: qty 10

## 2019-08-27 MED ORDER — WHITE PETROLATUM EX OINT
TOPICAL_OINTMENT | CUTANEOUS | Status: AC
  Filled 2019-08-27: qty 28.35

## 2019-08-27 MED ORDER — PROMETHAZINE HCL 25 MG/ML IJ SOLN: 6.2500 mg | INTRAMUSCULAR | Status: DC | PRN

## 2019-08-27 MED ORDER — THROMBIN 5000 UNITS EX SOLR
CUTANEOUS | Status: AC
  Filled 2019-08-27: qty 5000

## 2019-08-27 MED ORDER — MIDAZOLAM HCL 2 MG/2ML IJ SOLN
INTRAMUSCULAR | Status: AC
  Filled 2019-08-27: qty 2

## 2019-08-27 MED ORDER — LIDOCAINE-EPINEPHRINE 1 %-1:100000 IJ SOLN
INTRAMUSCULAR | Status: AC
  Filled 2019-08-27: qty 1

## 2019-08-27 MED ORDER — ROCURONIUM BROMIDE 10 MG/ML (PF) SYRINGE
PREFILLED_SYRINGE | INTRAVENOUS | Status: DC | PRN
  Administered 2019-08-27: 10 mg via INTRAVENOUS
  Administered 2019-08-27: 70 mg via INTRAVENOUS

## 2019-08-27 MED ORDER — PROPOFOL 10 MG/ML IV BOLUS
INTRAVENOUS | Status: AC
  Filled 2019-08-27: qty 20

## 2019-08-27 MED ORDER — LACTATED RINGERS IV SOLN: INTRAVENOUS | Status: DC | PRN

## 2019-08-27 MED ORDER — HYDROMORPHONE HCL 1 MG/ML IJ SOLN: 0.2500 mg | INTRAMUSCULAR | Status: DC | PRN

## 2019-08-27 MED ORDER — MIDAZOLAM HCL 5 MG/5ML IJ SOLN
INTRAMUSCULAR | Status: DC | PRN
  Administered 2019-08-27: 2 mg via INTRAVENOUS

## 2019-08-27 MED ORDER — OXYCODONE HCL 5 MG/5ML PO SOLN: 5.0000 mg | Freq: Once | ORAL | Status: DC | PRN

## 2019-08-27 MED ORDER — ACETAMINOPHEN 10 MG/ML IV SOLN: 1000.0000 mg | Freq: Once | INTRAVENOUS | Status: DC | PRN

## 2019-08-27 MED ORDER — CEFAZOLIN SODIUM-DEXTROSE 2-3 GM-%(50ML) IV SOLR
INTRAVENOUS | Status: DC | PRN
  Administered 2019-08-27: 2 g via INTRAVENOUS

## 2019-08-27 MED ORDER — ONDANSETRON HCL 4 MG/2ML IJ SOLN
INTRAMUSCULAR | Status: DC | PRN
  Administered 2019-08-27: 4 mg via INTRAVENOUS

## 2019-08-27 MED ORDER — DEXAMETHASONE SODIUM PHOSPHATE 10 MG/ML IJ SOLN
INTRAMUSCULAR | Status: DC | PRN
  Administered 2019-08-27: 10 mg via INTRAVENOUS

## 2019-08-27 MED ORDER — SUGAMMADEX SODIUM 200 MG/2ML IV SOLN
INTRAVENOUS | Status: DC | PRN
  Administered 2019-08-27: 300 mg via INTRAVENOUS

## 2019-08-27 MED ORDER — LACTATED RINGERS IV SOLN: INTRAVENOUS | Status: DC

## 2019-08-27 MED ORDER — CEFAZOLIN SODIUM-DEXTROSE 2-4 GM/100ML-% IV SOLN
INTRAVENOUS | Status: AC
  Filled 2019-08-27: qty 100

## 2019-08-27 MED ORDER — FENTANYL CITRATE (PF) 100 MCG/2ML IJ SOLN
INTRAMUSCULAR | Status: DC | PRN
  Administered 2019-08-27: 50 ug via INTRAVENOUS
  Administered 2019-08-27 (×2): 100 ug via INTRAVENOUS

## 2019-08-27 SURGICAL SUPPLY — 58 items
ADH SKN CLS APL DERMABOND .7 (GAUZE/BANDAGES/DRESSINGS) ×1
APL SKNCLS STERI-STRIP NONHPOA (GAUZE/BANDAGES/DRESSINGS)
BAG DECANTER FOR FLEXI CONT (MISCELLANEOUS) ×2 IMPLANT
BAND INSRT 18 STRL LF DISP RB (MISCELLANEOUS) ×2
BAND RUBBER #18 3X1/16 STRL (MISCELLANEOUS) ×4 IMPLANT
BENZOIN TINCTURE PRP APPL 2/3 (GAUZE/BANDAGES/DRESSINGS) IMPLANT
BLADE CLIPPER SURG (BLADE) ×1 IMPLANT
BLADE SURG 11 STRL SS (BLADE) ×2 IMPLANT
BUR MATCHSTICK NEURO 3.0 LAGG (BURR) ×2 IMPLANT
CANISTER SUCT 3000ML PPV (MISCELLANEOUS) ×2 IMPLANT
COVER WAND RF STERILE (DRAPES) ×1 IMPLANT
DECANTER SPIKE VIAL GLASS SM (MISCELLANEOUS) ×2 IMPLANT
DERMABOND ADVANCED (GAUZE/BANDAGES/DRESSINGS) ×1
DERMABOND ADVANCED .7 DNX12 (GAUZE/BANDAGES/DRESSINGS) ×1 IMPLANT
DRAPE C-ARM 42X72 X-RAY (DRAPES) ×4 IMPLANT
DRAPE HALF SHEET 40X57 (DRAPES) ×2 IMPLANT
DRAPE LAPAROTOMY 100X72 PEDS (DRAPES) ×2 IMPLANT
DRAPE MICROSCOPE LEICA (MISCELLANEOUS) ×2 IMPLANT
DURAPREP 6ML APPLICATOR 50/CS (WOUND CARE) ×2 IMPLANT
ELECT COATED BLADE 2.86 ST (ELECTRODE) ×2 IMPLANT
ELECT REM PT RETURN 9FT ADLT (ELECTROSURGICAL) ×2
ELECTRODE REM PT RTRN 9FT ADLT (ELECTROSURGICAL) ×1 IMPLANT
GAUZE 4X4 16PLY RFD (DISPOSABLE) IMPLANT
GLOVE BIO SURGEON STRL SZ7.5 (GLOVE) ×2 IMPLANT
GLOVE BIOGEL PI IND STRL 7.5 (GLOVE) ×2 IMPLANT
GLOVE BIOGEL PI INDICATOR 7.5 (GLOVE) ×4
GLOVE ECLIPSE 7.0 STRL STRAW (GLOVE) ×1 IMPLANT
GLOVE EXAM NITRILE LRG STRL (GLOVE) IMPLANT
GLOVE EXAM NITRILE XL STR (GLOVE) IMPLANT
GLOVE EXAM NITRILE XS STR PU (GLOVE) IMPLANT
GLOVE SURG SS PI 7.0 STRL IVOR (GLOVE) ×4 IMPLANT
GOWN STRL REUS W/ TWL LRG LVL3 (GOWN DISPOSABLE) ×2 IMPLANT
GOWN STRL REUS W/ TWL XL LVL3 (GOWN DISPOSABLE) IMPLANT
GOWN STRL REUS W/TWL 2XL LVL3 (GOWN DISPOSABLE) IMPLANT
GOWN STRL REUS W/TWL LRG LVL3 (GOWN DISPOSABLE) ×4
GOWN STRL REUS W/TWL XL LVL3 (GOWN DISPOSABLE) ×4
HEMOSTAT POWDER KIT SURGIFOAM (HEMOSTASIS) ×2 IMPLANT
KIT BASIN OR (CUSTOM PROCEDURE TRAY) ×2 IMPLANT
KIT TURNOVER KIT B (KITS) ×2 IMPLANT
NDL SPNL 18GX3.5 QUINCKE PK (NEEDLE) ×1 IMPLANT
NEEDLE HYPO 22GX1.5 SAFETY (NEEDLE) ×2 IMPLANT
NEEDLE SPNL 18GX3.5 QUINCKE PK (NEEDLE) ×2 IMPLANT
NS IRRIG 1000ML POUR BTL (IV SOLUTION) ×2 IMPLANT
PACK LAMINECTOMY NEURO (CUSTOM PROCEDURE TRAY) ×2 IMPLANT
PAD ARMBOARD 7.5X6 YLW CONV (MISCELLANEOUS) ×6 IMPLANT
PIN DISTRACTION 14MM (PIN) IMPLANT
PLATE ELITE 21MM (Plate) ×1 IMPLANT
SCREW SELF TAP VAR 4.0X13 (Screw) ×4 IMPLANT
SPACER BONE CORNERSTONE 7X14 (Orthopedic Implant) ×2 IMPLANT
SPONGE INTESTINAL PEANUT (DISPOSABLE) ×2 IMPLANT
SPONGE SURGIFOAM ABS GEL SZ50 (HEMOSTASIS) IMPLANT
STAPLER VISISTAT 35W (STAPLE) IMPLANT
SUT MNCRL AB 3-0 PS2 18 (SUTURE) ×2 IMPLANT
SUT VIC AB 3-0 SH 8-18 (SUTURE) ×2 IMPLANT
TAPE CLOTH 3X10 TAN LF (GAUZE/BANDAGES/DRESSINGS) ×2 IMPLANT
TOWEL GREEN STERILE (TOWEL DISPOSABLE) ×2 IMPLANT
TOWEL GREEN STERILE FF (TOWEL DISPOSABLE) ×2 IMPLANT
WATER STERILE IRR 1000ML POUR (IV SOLUTION) ×2 IMPLANT

## 2019-08-27 NOTE — Plan of Care (Signed)
  Problem: Education: Goal: Knowledge of General Education information will improve Description: Including pain rating scale, medication(s)/side effects and non-pharmacologic comfort measures Outcome: Progressing  Explained rationale for back surgery and how it can alleviate neurogenic back pain.  Problem: Pain Managment: Goal: General experience of comfort will improve Outcome: Progressing  Patient states pain is adequately controlled with pain meds available PRN.

## 2019-08-27 NOTE — Op Note (Signed)
PATIENT: Tim [name]  PROCEDURE DATE: 08/27/19  PRE-OPERATIVE DIAGNOSIS:  Cervical stenosis, central cord syndrome   POST-OPERATIVE DIAGNOSIS:  Cervical stenosis, central cord syndrome   PROCEDURE:  C4-C5 Anterior Cervical Discectomy and Instrumented Fusion   SURGEON:  Surgeon(s) and Role:    Jadene Pierini, MD - Primary    Lisbeth Renshaw, MD - Assisting   ANESTHESIA: ETGA   BRIEF HISTORY: This is a 44 year old man who presented after a fall with central cord syndrome. An MRI of the cervical spine showed severe canal stenosis with cord signal change at C4-5. I therefore recommended decompression of this level via a C4-5 ACDF. This was discussed with the patient as well as risks, benefits, and alternatives and the patient wished to proceed with surgical treatment.   OPERATIVE DETAIL: The patient was taken to the operating room and placed on the OR table in the supine position. A formal time out was performed with two patient identifiers and confirmed the operative site. Anesthesia was induced by the anesthesia team.  Fluoroscopy was used to localize the surgical level and an incision was marked in a skin crease. The area was then prepped and draped in a sterile fashion. A transverse linear incision was made on the right side of the neck. The platysma was divided and the sternocleidomastoid muscle was identified. The carotid sheath was palpated, identified, and retracted laterally with the sternocleidomastoid muscle. The strap muscles were identified and retracted medially and the pretracheal fascia was entered. A bent spinal needle was used with fluoroscopy to localize the surgical level after dissection. The longus colli were elevated bilaterally and a self-retaining retractor was placed. The endotracheal tube cuff balloon was deflated and reinflated after retractor placement.   Anterior osteophytes were removed until flush with the anterior vertebral body. The disc annulus was  incised and a complete C4-C5 discectomy was performed. The posterior longitudinal ligament was incised followed by ligamentous and bony removal until no central canal stenosis was present. Decompression was then taken out laterally into the bilateral foramina until no foraminal stenosis was palpable. A 74mm cortical allograft (Medtr[employer]) was inserted into the disc space as an interbody graft. An anterior plate ([employer]tr[employer]) was positioned and 4, 69mm screws were used to secure the plate to the C4 and C5 vertebral bodies. Hemostasis was obtained and the incision was closed in layers. All instrument and sponge counts were correct. The patient was then returned to anesthesia for emergence. No apparent complications at the completion of the procedure.   EBL:  20mL   DRAINS: none   SPECIMENS: none   Jadene Pierini, MD 08/27/19 2:46 PM

## 2019-08-27 NOTE — Brief Op Note (Signed)
08/27/2019  5:40 PM  PATIENT:  Tim [name]  44 y.o. male  PRE-OPERATIVE DIAGNOSIS:  Central Cord Syndrome  POST-OPERATIVE DIAGNOSIS:  Central Cord Syndrome  PROCEDURE:  Procedure(s): CERVICAL FOUR-FIVE ANTERIOR CERVICAL DECOMPRESSION/DISCECTOMY FUSION (N/A)  SURGEON:  Surgeon(s) and Role:    * Tanessa Tidd, Clovis Pu, MD - Primary    * Lisbeth Renshaw, MD - Assisting  PHYSICIAN ASSISTANT:   ANESTHESIA:   general  EBL:  50cc  BLOOD ADMINISTERED:none  DRAINS: none   LOCAL MEDICATIONS USED:  LIDOCAINE   SPECIMEN:  No Specimen  DISPOSITION OF SPECIMEN:  N/A  COUNTS:  YES  TOURNIQUET:  * No tourniquets in log *  DICTATION: .Note written in EPIC  PLAN OF CARE: Admit to inpatient   PATIENT DISPOSITION:  PACU - hemodynamically stable.   Delay start of Pharmacological VTE agent (>24hrs) due to surgical blood loss or risk of bleeding: yes

## 2019-08-27 NOTE — Transfer of Care (Signed)
22Immediate Anesthesia Transfer of Care Note  Patient: Tim [name]  Procedure(s) Performed: CERVICAL FOUR-FIVE ANTERIOR CERVICAL DECOMPRESSION/DISCECTOMY FUSION (N/A Neck)  Patient Location: PACU  Anesthesia Type:General  Level of Consciousness: awake and alert   Airway & Ox2gen Therapy: Patient Spontanous Breathing and Patient connected to nasal cannula oxygen  Post-op Assessment: Report given to RN and Post -op Vital signs reviewed and stable  Post vital signs: Reviewed and stable  Last Vitals:  Vitals Value Taken Time  BP 160/106 08/27/19 1810  Temp 36.7 C 08/27/19 1810  Pulse 73 08/27/19 1812  Resp 14 08/27/19 1812  SpO2 95 % 08/27/19 1812  Vitals shown include unvalidated device data.  Last Pain:  Vitals:   08/27/19 1345  TempSrc:   PainSc: 2          Complications: No apparent anesthesia complications

## 2019-08-27 NOTE — Progress Notes (Signed)
Order for OT slated to start 5/19.  Pt to OR today for surgery. Will return tomorrow to evaluate pt if appropriate.   Tory Emerald, [place] [phone]

## 2019-08-27 NOTE — Anesthesia Procedure Notes (Signed)
Procedure Name: Intubation Date/Time: 08/27/2019 4:05 PM Performed by: Gwenyth Allegra, CRNA Pre-anesthesia Checklist: Patient identified, Emergency Drugs available, Suction available, Patient being monitored and Timeout performed Patient Re-evaluated:Patient Re-evaluated prior to induction Oxygen Delivery Method: Circle system utilized Preoxygenation: Pre-oxygenation with 100% oxygen Induction Type: IV induction Laryngoscope Size: 4 and Glidescope Grade View: Grade I Tube size: 7.5 mm Number of attempts: 1 Airway Equipment and Method: Stylet and Video-laryngoscopy Placement Confirmation: ETT inserted through vocal cords under direct vision,  positive ETCO2 and breath sounds checked- equal and bilateral Secured at: 23 cm Tube secured with: Tape Dental Injury: Teeth and Oropharynx as per pre-operative assessment

## 2019-08-27 NOTE — Progress Notes (Signed)
Orthopedic Tech Progress Note Patient Details:  [name] 1975/08/24 [id number] Spoke with RN about patient's collar and she said he had on a "peachy color collar" which I knew wasn't what the MD wanted so I called in a order to [employer] for a HARD CERVICAL COLLAR Patient ID: Tim [name], male   DOB: 18-Mar-1976, 44 y.o.   MRN: [id number]   Donald Pore 08/27/2019, 11:40 AM

## 2019-08-27 NOTE — Progress Notes (Signed)
  CONSULTANT PROGRESS NOTE  TAN CLOPPER  GBT:[id number] DOB: [birth year] DOA: 08/25/2019 PCP: Tim [name], No Pcp Per  Consulting Physician: Dr. Maurice Small  Assessment & Plan: Cervical stenosis with central cord syndrome: Moderate C3-4 stenosis with severe C4-5 stenosis due to fall - to OR today per Dr. Maurice Small for C4-C5 decompression - Continue pain control  History of chronic combined HFrEF: This has improved significantly on echocardiogram performed 5/17 showing preserved EF and normal diastolic function. Appears euvolemic on exam. IVC normal caliber. No pulmonary edema on exam or CXR.  - Tim [name] of Dr. [name]. Needs outpatient follow up for ischemic evaluation.  - I do not see calcifications on limited chest films and no documented CAD. He has "no showed" for outpatient ischemic work up in the past. He's having no anginal symptoms or symptoms of CHF. Not starting beta blocker with cocaine on board.   HTN:  - Continue norvasc, lisinopril  Polysubstance abuse: +THC, cocaine, opioids on UDS. States cocaine must have been in the ecstasy he took, doesn't use cocaine intentionally.  - CSW consulted, cessation counseling provided.  - CIWA ordered in the event that he displays withdrawal symptoms.  AKI:  - SCr 1.49. Will recheck in AM.  Tobacco use:  - Nicotine patch ordered - Cessation counseling provided  Obesity: BMI 30, noted.  Subjective: Pain in left arm mostly is moderate, improved with pain medications.   Objective: Gen: 44 y.o. male in no distress Neck: C collar in place Pulm: Nonlabored breathing room air. Clear. CV: Regular rate and rhythm. No murmur, rub, or gallop. No JVD, no dependent edema. GI: Abdomen soft, non-tender, non-distended, with normoactive bowel sounds.  Ext: Warm, no deformities Skin: No rashes, lesions or ulcers on visualized skin. Neuro: Alert and oriented. Abnormal sensation L > RUE, subtle weakness on LUE. Psych: Judgement and insight appear  fair. Mood euthymic & affect congruent. Behavior is appropriate.    Echocardiogram 08/26/2019: 1. Left ventricular ejection fraction, by estimation, is 50 to 55%. The  left ventricle has low normal function. The left ventricle has no regional  wall motion abnormalities. Left ventricular diastolic parameters were  normal.  2. Right ventricular systolic function is normal. The right ventricular  size is normal. Tricuspid regurgitation signal is inadequate for assessing  PA pressure.  3. The mitral valve is normal in structure. Trivial mitral valve  regurgitation. No evidence of mitral stenosis.  4. The aortic valve is grossly normal. Aortic valve regurgitation is not  visualized. No aortic stenosis is present.  5. The inferior vena cava is normal in size with greater than 50%  respiratory variability, suggesting right atrial pressure of 3 mmHg.    LOS: 1 day   Time spent: 25 minutes.  Tyrone Nine, MD [facility] [email or web address] 08/27/2019, 2:36 PM

## 2019-08-27 NOTE — Anesthesia Procedure Notes (Signed)
Date/Time: 08/27/2019 4:05 PM Performed by: Gwenyth Allegra, CRNA
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
## 2019-08-27 NOTE — Progress Notes (Signed)
PT Cancellation Note  Patient Details Name: Tim [name] MRN: [id number] DOB: 07-23-1975   Cancelled Treatment:    Reason Eval/Treat Not Completed: Other (comment). PT order to start 5/19. Pt going to OR today for ACDF. PT to return tomorrow as able, as appropriate to complete PT eval.  Lewis Shock, PT, DPT Acute Rehabilitation Services Pager #: [phone] Office #: [phone]   Iona Hansen 08/27/2019, 8:36 AM

## 2019-08-27 NOTE — Progress Notes (Signed)
Neurosurgery Service Progress Note  Subjective: No acute events overnight, dysesthesias and strength slowly improving per pt   Objective: Vitals:   08/27/19 0330 08/27/19 0400 08/27/19 0746 08/27/19 1152  BP: 110/80  113/81 (!) 133/92  Pulse: 66 63 61 65  Resp: (!) 21  15 13   Temp: 98.8 F (37.1 C)  (!) 97.5 F (36.4 C) 99.1 F (37.3 C)  TempSrc: Oral  Oral Oral  SpO2: 98%  95% 100%  Weight:      Height:       Temp (24hrs), Avg:98.6 F (37 C), Min:97.5 F (36.4 C), Max:99.1 F (37.3 C)  CBC Latest Ref Rng & Units 08/26/2019 09/23/2017 09/22/2017  WBC 4.0 - 10.5 K/uL 6.5 6.3 7.2  Hemoglobin 13.0 - 17.0 g/dL 09/24/2017 12.8 17.1(H)  Hematocrit 39.0 - 52.0 % 50.3 45.5 50.1  Platelets 150 - 400 K/uL 226 195 202   BMP Latest Ref Rng & Units 08/26/2019 09/23/2017 09/22/2017  Glucose 70 - 99 mg/dL 09/24/2017) 767(M) 094(B)  BUN 6 - 20 mg/dL 17 13 11   Creatinine 0.61 - 1.24 mg/dL 096(G) 8.36(O  Sodium 135 - 145 mmol/L 140 141 142  Potassium 3.5 - 5.1 mmol/L 4.2 4.1 3.5  Chloride 98 - 111 mmol/L 108 106 106  CO2 22 - 32 mmol/L 23 28 27   Calcium 8.9 - 10.3 mg/dL 2.94) 7.65) 8.9    Intake/Output Summary (Last 24 hours) at 08/27/2019 1236 Last data filed at 08/27/2019 0546 Gross per 24 hour  Intake 966 ml  Output 450 ml  Net 516 ml    Current Facility-Administered Medications:  .  0.9 %  sodium chloride infusion, 250 mL, Intravenous, Continuous, Aidian Salomon A, MD, Last Rate: 1 mL/hr at 08/26/19 2012, 250 mL at 08/26/19 2012 .  0.9 %  sodium chloride infusion, , Intravenous, Continuous, Anasha Perfecto, 2013, MD, Last Rate: 50 mL/hr at 08/27/19 0050, New Bag at 08/27/19 0050 .  acetaminophen (TYLENOL) tablet 650 mg, 650 mg, Oral, Q4H PRN **OR** acetaminophen (TYLENOL) suppository 650 mg, 650 mg, Rectal, Q4H PRN, Deejay Koppelman A, MD .  amLODipine (NORVASC) tablet 10 mg, 10 mg, Oral, Daily, Arnet Hofferber, Clovis Pu, MD, 10 mg at 08/27/19 0904 .  docusate sodium (COLACE) capsule 100 mg,  100 mg, Oral, BID, 08/29/19, MD, 100 mg at 08/27/19 0903 .  folic acid (FOLVITE) tablet 1 mg, 1 mg, Oral, Daily, 08/29/19, MD, 1 mg at 08/27/19 0904 .  gabapentin (NEURONTIN) capsule 300 mg, 300 mg, Oral, TID, 08/29/19, MD, 300 mg at 08/27/19 0903 .  HYDROmorphone (DILAUDID) injection 1 mg, 1 mg, Intravenous, Q3H PRN, 08/29/19, MD, 1 mg at 08/27/19 0905 .  lisinopril (ZESTRIL) tablet 5 mg, 5 mg, Oral, Daily, Paulyne Mooty, 08/29/19, MD, 5 mg at 08/27/19 0904 .  LORazepam (ATIVAN) tablet 1-4 mg, 1-4 mg, Oral, Q1H PRN **OR** LORazepam (ATIVAN) injection 1-4 mg, 1-4 mg, Intravenous, Q1H PRN, 08/29/19, MD .  menthol-cetylpyridinium (CEPACOL) lozenge 3 mg, 1 lozenge, Oral, PRN **OR** phenol (CHLORASEPTIC) mouth spray 1 spray, 1 spray, Mouth/Throat, PRN, Clovis Pu, MD .  multivitamin with minerals tablet 1 tablet, 1 tablet, Oral, Daily, 08/29/19, MD, 1 tablet at 08/27/19 [phone]  nicotine (NICODERM CQ - dosed in mg/24 hours) patch 14 mg, 14 mg, Transdermal, Daily, Jonah Blue, MD, 14 mg at 08/27/19 0904 .  ondansetron (ZOFRAN) tablet 4 mg, 4 mg, Oral, Q6H PRN **OR** ondansetron (ZOFRAN) injection 4 mg, 4 mg, Intravenous, Q6H  PRN, Judith Part, MD, 4 mg at 08/26/19 2017 .  oxyCODONE (Oxy IR/ROXICODONE) immediate release tablet 10 mg, 10 mg, Oral, Q4H PRN, Judith Part, MD .  oxyCODONE (Oxy IR/ROXICODONE) immediate release tablet 15 mg, 15 mg, Oral, Q4H PRN, Judith Part, MD, 15 mg at 08/26/19 1833 .  polyethylene glycol (MIRALAX / GLYCOLAX) packet 17 g, 17 g, Oral, Daily PRN, Bailley Guilford A, MD .  sodium chloride flush (NS) 0.9 % injection 3 mL, 3 mL, Intravenous, Q12H, Ranie Chinchilla, Joyice Faster, MD, 3 mL at 08/27/19 0905 .  sodium chloride flush (NS) 0.9 % injection 3 mL, 3 mL, Intravenous, PRN, Judith Part, MD .  thiamine tablet 100 mg, 100 mg, Oral, Daily, 100 mg at 08/26/19 1410 **OR** thiamine (B-1) injection 100  mg, 100 mg, Intravenous, Daily, Karmen Bongo, MD, 100 mg at 08/27/19 7902   Physical Exam: Strength 5/5 on R, 4/5 on L with numbness and dysesthesias, +Hoffman's b/l  Assessment & Plan: 44 y.o. man s/p fall with central cord syndrome, MRI with severe canal stenosis and cord signal change at C4-5.  -OR for C4-5 ACDF today  Judith Part  08/27/19 12:36 PM

## 2019-08-28 ENCOUNTER — Encounter (HOSPITAL_COMMUNITY): Payer: Self-pay | Admitting: Neurological Surgery

## 2019-08-28 DIAGNOSIS — S14129A Central cord syndrome at unspecified level of cervical spinal cord, initial encounter: Secondary | ICD-10-CM

## 2019-08-28 DIAGNOSIS — I5042 Chronic combined systolic (congestive) and diastolic (congestive) heart failure: Secondary | ICD-10-CM

## 2019-08-28 DIAGNOSIS — M4802 Spinal stenosis, cervical region: Secondary | ICD-10-CM

## 2019-08-28 DIAGNOSIS — I10 Essential (primary) hypertension: Secondary | ICD-10-CM

## 2019-08-28 DIAGNOSIS — F1910 Other psychoactive substance abuse, uncomplicated: Secondary | ICD-10-CM

## 2019-08-28 DIAGNOSIS — D72819 Decreased white blood cell count, unspecified: Secondary | ICD-10-CM

## 2019-08-28 DIAGNOSIS — G9520 Unspecified cord compression: Secondary | ICD-10-CM

## 2019-08-28 LAB — BASIC METABOLIC PANEL
Anion gap: 10 (ref 5–15)
BUN: 16 mg/dL (ref 6–20)
CO2: 27 mmol/L (ref 22–32)
Calcium: 8.8 mg/dL — ABNORMAL LOW (ref 8.9–10.3)
Chloride: 104 mmol/L (ref 98–111)
Creatinine, Ser: 1.33 mg/dL — ABNORMAL HIGH (ref 0.61–1.24)
GFR calc Af Amer: >60 mL/min (ref >60)
GFR calc non Af Amer: >60 mL/min (ref >60)
Glucose, Bld: 155 mg/dL — ABNORMAL HIGH (ref 70–99)
Potassium: 4.2 mmol/L (ref 3.5–5.1)
Sodium: 141 mmol/L (ref 135–145)

## 2019-08-28 LAB — CBC
HCT: 47.6 % (ref 39.0–52.0)
Hemoglobin: 15.6 g/dL (ref 13.0–17.0)
MCH: 31.3 pg (ref 26.0–34.0)
MCHC: 32.8 g/dL (ref 30.0–36.0)
MCV: 95.4 fL (ref 80.0–100.0)
Platelets: 240 10*3/uL (ref 150–400)
RBC: 4.99 MIL/uL (ref 4.22–5.81)
RDW: 16.8 % — ABNORMAL HIGH (ref 11.5–15.5)
WBC: 10.9 10*3/uL — ABNORMAL HIGH (ref 4.0–10.5)
nRBC: 0 % (ref 0.0–0.2)

## 2019-08-28 MED ORDER — LISINOPRIL 10 MG PO TABS
10.0000 mg | ORAL_TABLET | Freq: Every day | ORAL | Status: DC
  Administered 2019-08-29: 10 mg via ORAL
  Filled 2019-08-28: qty 1

## 2019-08-28 MED ORDER — NICOTINE 14 MG/24HR TD PT24
14.0000 mg | MEDICATED_PATCH | Freq: Every day | TRANSDERMAL | Status: DC
  Administered 2019-08-29: 14 mg via TRANSDERMAL
  Filled 2019-08-28 (×2): qty 1

## 2019-08-28 NOTE — Progress Notes (Signed)
Neurosurgery Service Progress Note  Subjective: No acute events overnight, dysesthesias and strength slowly improving, expected neck pain  Objective: Vitals:   08/28/19 0112 08/28/19 0300 08/28/19 0409 08/28/19 0724  BP: (!) 142/95 (!) 155/97 (!) 141/83 (!) 154/105  Pulse: 96  98 95  Resp:  17 (!) 24 16  Temp:  98.5 F (36.9 C) 98.5 F (36.9 C) 98.8 F (37.1 C)  TempSrc:  Oral  Oral  SpO2:    96%  Weight:      Height:       Temp (24hrs), Avg:98.6 F (37 C), Min:98 F (36.7 C), Max:99.3 F (37.4 C)  CBC Latest Ref Rng & Units 08/26/2019 09/23/2017 09/22/2017  WBC 4.0 - 10.5 K/uL 6.5 6.3 7.2  Hemoglobin 13.0 - 17.0 g/dL 16.5 14.8 17.1(H)  Hematocrit 39.0 - 52.0 % 50.3 45.5 50.1  Platelets 150 - 400 K/uL 226 195 202   BMP Latest Ref Rng & Units 08/26/2019 09/23/2017 09/22/2017  Glucose 70 - 99 mg/dL 114(H) 105(H) 111(H)  BUN 6 - 20 mg/dL 17 13 11   Creatinine 0.61 - 1.24 mg/dL 1.49(H) 1.15 1.09  Sodium 135 - 145 mmol/L 140 141 142  Potassium 3.5 - 5.1 mmol/L 4.2 4.1 3.5  Chloride 98 - 111 mmol/L 108 106 106  CO2 22 - 32 mmol/L 23 28 27   Calcium 8.9 - 10.3 mg/dL 8.6(L) 8.5(L) 8.9    Intake/Output Summary (Last 24 hours) at 08/28/2019 0847 Last data filed at 08/28/2019 0412 Gross per 24 hour  Intake 2754.59 ml  Output 2260 ml  Net 494.59 ml    Current Facility-Administered Medications:  .  0.9 %  sodium chloride infusion, 250 mL, Intravenous, Continuous, Yarieliz Wasser A, MD, Last Rate: 1 mL/hr at 08/26/19 2012, 250 mL at 08/26/19 2012 .  acetaminophen (TYLENOL) tablet 650 mg, 650 mg, Oral, Q4H PRN, 650 mg at 08/27/19 2102 **OR** acetaminophen (TYLENOL) suppository 650 mg, 650 mg, Rectal, Q4H PRN, Judith Part, MD .  amLODipine (NORVASC) tablet 10 mg, 10 mg, Oral, Daily, Taylie Helder, Joyice Faster, MD, 10 mg at 08/28/19 0824 .  docusate sodium (COLACE) capsule 100 mg, 100 mg, Oral, BID, Judith Part, MD, 100 mg at 08/28/19 0824 .  folic acid (FOLVITE) tablet 1 mg, 1  mg, Oral, Daily, Jendaya Gossett, Joyice Faster, MD, 1 mg at 08/28/19 0823 .  gabapentin (NEURONTIN) capsule 300 mg, 300 mg, Oral, TID, Judith Part, MD, 300 mg at 08/28/19 0824 .  HYDROmorphone (DILAUDID) injection 1 mg, 1 mg, Intravenous, Q3H PRN, Judith Part, MD, 1 mg at 08/28/19 0446 .  lactated ringers infusion, , Intravenous, Continuous, Judith Part, MD, Last Rate: 10 mL/hr at 08/27/19 1433, New Bag at 08/27/19 1433 .  lisinopril (ZESTRIL) tablet 5 mg, 5 mg, Oral, Daily, Sarahlynn Cisnero, Joyice Faster, MD, 5 mg at 08/28/19 0824 .  LORazepam (ATIVAN) tablet 1-4 mg, 1-4 mg, Oral, Q1H PRN, 2 mg at 08/28/19 0824 **OR** LORazepam (ATIVAN) injection 1-4 mg, 1-4 mg, Intravenous, Q1H PRN, Judith Part, MD, 1 mg at 08/27/19 1321 .  menthol-cetylpyridinium (CEPACOL) lozenge 3 mg, 1 lozenge, Oral, PRN **OR** phenol (CHLORASEPTIC) mouth spray 1 spray, 1 spray, Mouth/Throat, PRN, Verlan Grotz, Joyice Faster, MD .  multivitamin with minerals tablet 1 tablet, 1 tablet, Oral, Daily, Judith Part, MD, 1 tablet at 08/28/19 0824 .  nicotine (NICODERM CQ - dosed in mg/24 hours) patch 14 mg, 14 mg, Transdermal, Daily, Judith Part, MD, 14 mg at 08/27/19 0904 .  ondansetron (ZOFRAN)  tablet 4 mg, 4 mg, Oral, Q6H PRN **OR** ondansetron (ZOFRAN) injection 4 mg, 4 mg, Intravenous, Q6H PRN, Jadene Pierini, MD, 4 mg at 08/26/19 2017 .  oxyCODONE (Oxy IR/ROXICODONE) immediate release tablet 10 mg, 10 mg, Oral, Q4H PRN, Jadene Pierini, MD .  oxyCODONE (Oxy IR/ROXICODONE) immediate release tablet 15 mg, 15 mg, Oral, Q4H PRN, Jadene Pierini, MD, 15 mg at 08/28/19 0732 .  polyethylene glycol (MIRALAX / GLYCOLAX) packet 17 g, 17 g, Oral, Daily PRN, Akshitha Culmer A, MD .  sodium chloride flush (NS) 0.9 % injection 3 mL, 3 mL, Intravenous, Q12H, Zamari Vea, Clovis Pu, MD, 3 mL at 08/28/19 0825 .  sodium chloride flush (NS) 0.9 % injection 3 mL, 3 mL, Intravenous, PRN, Jadene Pierini, MD .   thiamine tablet 100 mg, 100 mg, Oral, Daily, 100 mg at 08/28/19 0822 **OR** thiamine (B-1) injection 100 mg, 100 mg, Intravenous, Daily, Jadene Pierini, MD, 100 mg at 08/27/19 2683   Physical Exam: Strength 5/5 on R, 4/5 on L with numbness and dysesthesias, +Hoffman's b/l Incision c/d/i, neck soft  Assessment & Plan: 44 y.o. man s/p fall with central cord syndrome, MRI with severe canal stenosis and cord signal change at C4-5. 5/18 s/p C4-5 ACDF  -PT/OT eval today to determine disposition, may require CIR -ADAT, activity as tolerated, no brace needed -medicine recs -SCDs/TEDs, start SQH POD2  Jadene Pierini  08/28/19 8:47 AM

## 2019-08-28 NOTE — Progress Notes (Addendum)
Patient in bed. Answers questions appropriately. Some questions  repeated because patient is very drowsy. Transferred from surgery at change of shift. Somewhat confused about situation. Patient says he don't remember having surgery.  Reoriented as necessary. Patient has high anxiety at this time. CIWAs per MD order.   NS currently infusing at 62ml/hr via left wrist. Stopped during the night because tolerating po well.    Patient very persistent about standing up to use urinal.  Patient sat on side of bed, dangled. Tolerated well. Vitals taken. B/p elevated. Rechecked 144/84. HR 74. Denies dizziness, or lightheadedness. Safety measures explained to patient. Patient states that he will just hold his urine. Very frustrated and agitated at this time. Patient remains adamant about getting out the bed. Continued to explain that he will require a little more assistance than just me considering his history of falls and reason for his admission. Encouraged to change positions slowly. Sat on side of bed for a short while. Weakness noted to bilateral legs upon standing. Unsteady on feet. Seem to tolerate fair x2 assist with walker. Patient becomes tearful and silent.  Patient wants pain medicine and something to eat. Diet was placed later in shift on days. Patient did not receive tray. Patient fed 2 tv dinners and fruit. Patient's sister calls this nurse and says that patient wants a family member to bring him something to eat and that he don't eat tv dinners. Patient reminded of policy of no visitors at this time. It appears that he called his sister and told her that he did not have anything to eat and we refused his family to bring him food.   Patient did receive meal from family member per charge nurse from ED. On room air. Explained pain med regimen to patient. Patient accepts this. Rates pain 10/10- to left arm and neck.  CIWA scores are not elevated for prn ativan. Scored 2, and 4 during the night. Patient uses  call bell appropriately. Assisted and ambulated with walker to the bathroom a several times. Tolerated well.   Attempted condom cath on patient due to his frequency of urination. Patient accepted this idea and later pulled condom catheter off. Movement encouraged but also reminded to take it slow.   Pain level monitored throughout the night. Patient refused any alternative measures for pain (refused heat/ice/ and po tylenol). Requests to have something stronger. Neck site tender, skin glue noted. No drainage or redness to site. Patient c/o sore throat, prn lozenges given.  One dose of dilaudid 1mg  given to this patient prn pain not being controlled by po oxy 15mg . Will pass on to day shift the order to only give dilaudid if his po meds are not working. Blood pressures stable throughout shift. 140s/ 80s

## 2019-08-28 NOTE — Consult Note (Signed)
Physical Medicine and Rehabilitation Consult   Reason for Consult: Central cord syndrome  Referring Physician: Dr. Maurice Small   HPI: Tim [name] is a 44 y.o. 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 History taken from chart review and patient. Plans to return home with girlfriend, who along with others, can provide 24/7 support. UDS positive for cocaine and THC. He was found to have multilevel degenerative changes in cerfical spine with severe canal stenosis and cord compression at C4-C5 and abnormal cord hyperintensity at C4 and C4/5 question edema or myelomalacia. He was taken to OR on 08/27/19 for ACDF C4/C5 by Dr. Maurice Small.  Therapy evaluations done today and CIR recommended due to functional deficits.   Review of Systems  Constitutional: Positive for malaise/fatigue. Negative for chills and fever.  HENT: Negative for hearing loss and tinnitus.   Eyes: Negative for blurred vision and double vision.  Respiratory: Negative for cough, hemoptysis and shortness of breath.   Cardiovascular: Negative for chest pain and palpitations.  Gastrointestinal: Negative for heartburn and nausea.  Genitourinary: Negative for dysuria and urgency.  Musculoskeletal: Positive for back pain, joint pain and myalgias.  Neurological: Positive for tingling, sensory change, focal weakness and weakness. Negative for dizziness, speech change and headaches.  Psychiatric/Behavioral: Negative for depression and suicidal ideas. The patient is not nervous/anxious and does not have insomnia.      Past Medical History:  Diagnosis Date  . Asthma   . Chronic combined systolic (congestive) and diastolic (congestive) heart failure (HCC)   . Coronary artery disease   . Hypertension   . Noncompliance   . Polysubstance abuse [facility])     Past Surgical History:  Procedure Laterality  Date  . TRANSTHORACIC ECHOCARDIOGRAM  09/23/2017   EF 20 to 25% with diffuse hypokinesis.  GR 1 DD.  Kinesis was more noted in the anterior and anterolateral wall  . WISDOM TOOTH EXTRACTION      Family History  Problem Relation Age of Onset  . Hypertension Mother     Social History:  Lives with friend. He reports that he has been smoking cigarettes. He has a 29.00 pack-year smoking history. He has never used smokeless tobacco. He reports current alcohol use. He reports previous drug use. Drugs: Cocaine and Marijuana.    Allergies: No Known Allergies    Medications Prior to Admission  Medication Sig Dispense Refill  . amLODipine (NORVASC) 10 MG tablet Take 1 tablet (10 mg total) by mouth daily. 30 tablet 11  . lisinopril (ZESTRIL) 5 MG tablet Take 1 tablet (5 mg total) by mouth daily. 30 tablet 0    Home: Home Living Family/patient expects to be discharged to:: Private residence Living Arrangements: Non-relatives/Friends Available Help at Discharge: Friend(s) Type of Home: House Home Access: Stairs to enter Secretary/administrator of Steps: 7 Home Layout: One level Bathroom Shower/Tub: Engineer, manufacturing systems: Handicapped height Home Equipment: None Additional Comments: friend and x2 kids ( 39 yo and 65 yo ) does not work. friend does not work  Functional History: Prior Function Level of Independence: Independent Functional Status:  Mobility: Bed Mobility Overal bed mobility: Needs Assistance Bed Mobility: Rolling, Supine to Sit Rolling: Supervision Supine to sit: Min assist General bed mobility comments: (A) to elevate trunk from bed surface Transfers Overall transfer level: Needs assistance Equipment used: Rolling walker (2 wheeled) Transfers: Sit to/from Stand Sit to  Stand: Min assist General transfer comment: verbal cues to push up from bed, minA to power up due to low surface height, minA to steady during transistion of  hands Ambulation/Gait Ambulation/Gait assistance: +2 safety/equipment, Min assist Gait Distance (Feet): 60 Feet Assistive device: Rolling walker (2 wheeled) Gait Pattern/deviations: Step-through pattern, Decreased stride length, Shuffle General Gait Details: pt with decreased step height, increased dependentce on RW, verbal cues to stand up straight, pt with progressive shuffling pattern with increased ambulation. Pt c/o my LEs are getting cold and I feel nauseous. BP taken 132/94 compared to 154/104 when in bed, noted 20 point difference systolically and 10 point diastolic Gait velocity: slow Gait velocity interpretation: <1.31 ft/sec, indicative of household ambulator    ADL: ADL Overall ADL's : Needs assistance/impaired Grooming: Minimal assistance, Sitting Grooming Details (indicate cue type and reason): pt requires increased time and (A) to open tooth paste. pt with decrease ability to hold items in L hand Lower Body Dressing Details (indicate cue type and reason): able to bring L LE to R knee in a figure 4 pattern General ADL Comments: pt states "feeling like he wanted to vomit but that he coudl control it" pt noted to have BP decrease orthostatic  Cognition: Cognition Orientation Level: Oriented X4 Cognition Arousal/Alertness: Awake/alert Behavior During Therapy: Restless General Comments: perseverating on L wrist IV site asking staff to remove it. RN checked prior to session . AFter Rn leaving asking therapist to remove the IV site . pt was not able to verbalize spinal cord injury but states "i coudl not get up as the reason for admission"   Blood pressure (!) 133/92, pulse 91, temperature 98.3 F (36.8 C), temperature source Oral, resp. rate 15, height 6\' 2"  (1.88 m), weight 107.3 kg, SpO2 95 %. Physical Exam  Nursing note and vitals reviewed. Constitutional: He is oriented to person, place, and time. He appears well-developed and well-nourished.  HENT:  Head: Normocephalic  and atraumatic.  Eyes: EOM are normal. Right eye exhibits no discharge. Left eye exhibits no discharge.  Neck: No tracheal deviation present. No thyromegaly present.  Respiratory: Effort normal. No stridor. No respiratory distress.  GI: Soft. He exhibits no distension. There is no abdominal tenderness.  Musculoskeletal:     Comments: No edema or tenderness in extremities  Neurological: He is alert and oriented to person, place, and time.  Motor: RUE: 4/5 proximal to distal LUE: 2+/5 proximal to distal RLE: 4+/5 proximal to distal LLE: 4-4+/5 proximal to distal Sensation diminished to light touch LUE, LLE toes  Skin: Skin is warm and dry.  Psychiatric: He has a normal mood and affect. His behavior is normal.    Results for orders placed or performed during the hospital encounter of 08/25/19 (from the past 24 hour(s))  CBC     Status: Abnormal   Collection Time: 08/28/19 10:15 AM  Result Value Ref Range   WBC 10.9 (H) 4.0 - 10.5 K/uL   RBC 4.99 4.22 - 5.81 MIL/uL   Hemoglobin 15.6 13.0 - 17.0 g/dL   HCT 08/30/19 63.0 - 16.0 %   MCV 95.4 80.0 - 100.0 fL   MCH 31.3 26.0 - 34.0 pg   MCHC 32.8 30.0 - 36.0 g/dL   RDW 10.9 (H) 32.3 - 55.7 %   Platelets 240 150 - 400 K/uL   nRBC 0.0 0.0 - 0.2 %  Basic metabolic panel     Status: Abnormal   Collection Time: 08/28/19 10:15 AM  Result Value Ref Range  Sodium 141 135 - 145 mmol/L   Potassium 4.2 3.5 - 5.1 mmol/L   Chloride 104 98 - 111 mmol/L   CO2 27 22 - 32 mmol/L   Glucose, Bld 155 (H) 70 - 99 mg/dL   BUN 16 6 - 20 mg/dL   Creatinine, Ser 8.111.33 (H) 0.61 - 1.24 mg/dL   Calcium 8.8 (L) 8.9 - 10.3 mg/dL   GFR calc non Af Amer >60 >60 mL/min   GFR calc Af Amer >60 >60 mL/min   Anion gap 10 5 - 15   DG Cervical Spine 1 View  Result Date: 08/27/2019 CLINICAL DATA:  C4-5 anterior cervical decompression/discectomy fusion. EXAM: DG CERVICAL SPINE - 1 VIEW; DG C-ARM 1-60 MIN COMPARISON:  None. FINDINGS: Single lateral fluoroscopic spot view  of the cervical spine obtained in the operating room. Anterior C4-C5 fusion hardware in place. Total fluoroscopy time 3 seconds. IMPRESSION: Lateral fluoroscopic spot view in the operating room after C4-C5 anterior fusion. Electronically Signed   By: Narda RutherfordMelanie  Sanford M.D.   On: 08/27/2019 17:58   DG C-Arm 1-60 Min  Result Date: 08/27/2019 CLINICAL DATA:  C4-5 anterior cervical decompression/discectomy fusion. EXAM: DG CERVICAL SPINE - 1 VIEW; DG C-ARM 1-60 MIN COMPARISON:  None. FINDINGS: Single lateral fluoroscopic spot view of the cervical spine obtained in the operating room. Anterior C4-C5 fusion hardware in place. Total fluoroscopy time 3 seconds. IMPRESSION: Lateral fluoroscopic spot view in the operating room after C4-C5 anterior fusion. Electronically Signed   By: Narda RutherfordMelanie  Sanford M.D.   On: 08/27/2019 17:58   ECHOCARDIOGRAM COMPLETE  Result Date: 08/26/2019    ECHOCARDIOGRAM REPORT   Patient Name:   Judie GrieveOCTAVIUS K Ballinas Date of Exam: 08/26/2019 Medical Rec #:  [id number]         Height:       73.0 in Accession #:    [id number]        Weight:       240.0 lb Date of Birth:  08/29/1975        BSA:          2.325 m Patient Age:    43 years          BP:           138/98 mmHg Patient Gender: M                 HR:           73 bpm. Exam Location:  Inpatient Procedure: 2D Echo Indications:    Congestive Heart Failure 428.0 / I50.9  History:        Patient has prior history of Echocardiogram examinations, most                 recent 09/23/2017. Dilated cardiomyopathy and CHF; Risk                 Factors:Hypertension and Tobacco dependence. Overdose of drug.  Sonographer:    Leeroy Bockhelsea Turrentine Referring Phys: [id number] JENNIFER YATES IMPRESSIONS  1. Left ventricular ejection fraction, by estimation, is 50 to 55%. The left ventricle has low normal function. The left ventricle has no regional wall motion abnormalities. Left ventricular diastolic parameters were normal.  2. Right ventricular systolic function is normal.  The right ventricular size is normal. Tricuspid regurgitation signal is inadequate for assessing PA pressure.  3. The mitral valve is normal in structure. Trivial mitral valve regurgitation. No evidence of mitral stenosis.  4. The aortic valve is grossly normal. Aortic valve  regurgitation is not visualized. No aortic stenosis is present.  5. The inferior vena cava is normal in size with greater than 50% respiratory variability, suggesting right atrial pressure of 3 mmHg. Comparison(s): Changes from prior study are noted. EF improved compared to prior study. FINDINGS  Left Ventricle: Left ventricular ejection fraction, by estimation, is 50 to 55%. The left ventricle has low normal function. The left ventricle has no regional wall motion abnormalities. The left ventricular internal cavity size was normal in size. There is no left ventricular hypertrophy. Left ventricular diastolic parameters were normal. Right Ventricle: The right ventricular size is normal. No increase in right ventricular wall thickness. Right ventricular systolic function is normal. Tricuspid regurgitation signal is inadequate for assessing PA pressure. Left Atrium: Left atrial size was normal in size. Right Atrium: Right atrial size was normal in size. Pericardium: There is no evidence of pericardial effusion. Mitral Valve: The mitral valve is normal in structure. Trivial mitral valve regurgitation. No evidence of mitral valve stenosis. Tricuspid Valve: The tricuspid valve is normal in structure. Tricuspid valve regurgitation is trivial. No evidence of tricuspid stenosis. Aortic Valve: The aortic valve is grossly normal. Aortic valve regurgitation is not visualized. No aortic stenosis is present. Pulmonic Valve: The pulmonic valve was not well visualized. Pulmonic valve regurgitation is not visualized. No evidence of pulmonic stenosis. Aorta: The aortic root, ascending aorta and aortic arch are all structurally normal, with no evidence of  dilitation or obstruction. Venous: The inferior vena cava is normal in size with greater than 50% respiratory variability, suggesting right atrial pressure of 3 mmHg. IAS/Shunts: No atrial level shunt detected by color flow Doppler.  LEFT VENTRICLE PLAX 2D LVIDd:         5.40 cm  Diastology LVIDs:         3.80 cm  LV e' lateral:   11.20 cm/s LV PW:         1.00 cm  LV E/e' lateral: 6.2 LV IVS:        1.00 cm  LV e' medial:    8.70 cm/s LVOT diam:     2.50 cm  LV E/e' medial:  8.0 LV SV:         103 LV SV Index:   44 LVOT Area:     4.91 cm  RIGHT VENTRICLE RV S prime:     12.80 cm/s LEFT ATRIUM             Index LA diam:        4.00 cm 1.72 cm/m LA Vol (A2C):   54.6 ml 23.48 ml/m LA Vol (A4C):   66.8 ml 28.73 ml/m LA Biplane Vol: 60.4 ml 25.98 ml/m  AORTIC VALVE LVOT Vmax:   103.00 cm/s LVOT Vmean:  77.300 cm/s LVOT VTI:    0.209 m  AORTA Ao Root diam: 2.90 cm MITRAL VALVE MV Area (PHT): 2.91 cm    SHUNTS MV Decel Time: 261 msec    Systemic VTI:  0.21 m MV E velocity: 69.80 cm/s  Systemic Diam: 2.50 cm MV A velocity: 61.80 cm/s MV E/A ratio:  1.13 Buford Dresser MD Electronically signed by Buford Dresser MD Signature Date/Time: 08/26/2019/5:10:40 PM    Final     Assessment/Plan: 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  Records reviewed and summated above.  1. Does the need for close, 24 hr/day medical supervision in concert with the patient's rehab needs make it unreasonable for this patient to be served in  a less intensive setting? Potentially 2. Co-Morbidities requiring supervision/potential complications: CAD, chronic systolic CHF (Monitor in accordance with increased physical activity and avoid UE resistance excercises), polysubstance abuse (counsel), leukocytosis (repeat labs, cont to monitor for signs and symptoms of infection, further workup if indicated) 3. Due to safety, disease management and patient education, does the patient  require 24 hr/day rehab nursing? Potentially 4. Does the patient require coordinated care of a physician, rehab nurse, therapy disciplines of PT/OT to address physical and functional deficits in the context of the above medical diagnosis(es)? Potentially Addressing deficits in the following areas: balance, endurance, locomotion, strength, transferring, bathing, dressing, toileting and psychosocial support 5. Can the patient actively participate in an intensive therapy program of at least 3 hrs of therapy per day at least 5 days per week? Yes 6. The potential for patient to make measurable gains while on inpatient rehab is good 7. Anticipated functional outcomes upon discharge from inpatient rehab are modified independent and supervision  with PT, modified independent and supervision with OT, n/a with SLP. 8. Estimated rehab length of stay to reach the above functional goals is: 6-10 days. 9. Anticipated discharge destination: Home 10. Overall Rehab/Functional Prognosis: good  RECOMMENDATIONS: This patient's condition is appropriate for continued rehabilitative care in the following setting: Patient doing well on day of evaluation.  Will consider CIR if pt does not progress to supervision level of functioning.  Patient has agreed to participate in recommended program. Yes Note that insurance prior authorization may be required for reimbursement for recommended care.  Comment: Rehab Admissions Coordinator to follow up.  I have personally performed a face to face diagnostic evaluation, including, but not limited to relevant history and physical exam findings, of this patient and developed relevant assessment and plan.  Additionally, I have reviewed and concur with the physician assistant's documentation above.   Maryla Morrow, MD, ABPMR Jacquelynn Cree, PA-C 08/28/2019

## 2019-08-28 NOTE — Evaluation (Signed)
Physical Therapy Evaluation Patient Details Name: Tim [name] MRN: [id number] DOB: Oct 16, 1975 Today's Date: 08/28/2019   History of Present Illness  This is a 44 y.o. man that presents after a fall, +h/o polysubstance abuse, striking his face. He has had difficulty ambulating and left sided weakness with left sided burning extremity pain since that time. Pt found to have cervical stenosis and central cord syndrome and underwent a C4-5 anterior cervical decompression/discectomy Fusion on 5/18.    Clinical Impression  Pt presenting with Left sided weakness and impaired sensation requiring assist for all transfers and ambulation. Pt indep PTA now requiring RW for safe ambulation. Pt experiencing drop in BP during ambulation reports nausea and the sensation of LEs feeling cold. Recommending using ace wraps around LEs when pt is up and walking to assist in preventing orthostasis. Recommending CIR upon d/c as patient is motivated and desires to return to indep. Pt to strongly benefit from aggressive rehab program. Acute PT to cont to follow.    Follow Up Recommendations CIR    Equipment Recommendations  (TBD at next venue)    Recommendations for Other Services Rehab consult     Precautions / Restrictions Precautions Precautions: Cervical;Fall Precaution Comments: pt educated on cervical precautions, pt with verbal understanding Required Braces or Orthoses: Cervical Brace(when up OOB) Restrictions Weight Bearing Restrictions: No      Mobility  Bed Mobility Overal bed mobility: Needs Assistance Bed Mobility: Rolling;Supine to Sit Rolling: Supervision   Supine to sit: Min assist     General bed mobility comments: (A) to elevate trunk from bed surface  Transfers Overall transfer level: Needs assistance Equipment used: Rolling walker (2 wheeled) Transfers: Sit to/from Stand Sit to Stand: Min assist         General transfer comment: verbal cues to push up from bed, minA to  power up due to low surface height, minA to steady during transistion of hands  Ambulation/Gait Ambulation/Gait assistance: +2 safety/equipment;Min assist Gait Distance (Feet): 60 Feet Assistive device: Rolling walker (2 wheeled) Gait Pattern/deviations: Step-through pattern;Decreased stride length;Shuffle Gait velocity: slow Gait velocity interpretation: <1.31 ft/sec, indicative of household ambulator General Gait Details: pt with decreased step height, increased dependentce on RW, verbal cues to stand up straight, pt with progressive shuffling pattern with increased ambulation. Pt c/o my LEs are getting cold and I feel nauseous. BP taken 132/94 compared to 154/104 when in bed, noted 20 point difference systolically and 10 point diastolic  Stairs            Wheelchair Mobility    Modified Rankin (Stroke Patients Only)       Balance Overall balance assessment: Needs assistance Sitting-balance support: Feet supported;No upper extremity supported Sitting balance-Leahy Scale: Fair     Standing balance support: Bilateral upper extremity supported Standing balance-Leahy Scale: Poor Standing balance comment: dependent on RW, pt able to stand on one leg in RW to adjust croc                             Pertinent Vitals/Pain Pain Assessment: 0-10    Home Living Family/patient expects to be discharged to:: Private residence Living Arrangements: Non-relatives/Friends Available Help at Discharge: Friend(s) Type of Home: House Home Access: Stairs to enter   Secretary/administrator of Steps: 7 Home Layout: One level Home Equipment: None Additional Comments: friend and x2 kids ( 70 yo and 61 yo ) does not work. friend does not work  Prior Function Level of Independence: Independent               Hand Dominance   Dominant Hand: Right    Extremity/Trunk Assessment   Upper Extremity Assessment Upper Extremity Assessment: RUE deficits/detail;LUE  deficits/detail RUE Deficits / Details: R forearm numbness less severe than L  grasp 4 out 5 with decrease pad to pad  RUE Sensation: decreased light touch RUE Coordination: decreased fine motor;decreased gross motor LUE Deficits / Details: L forearm numbness greater than R side pt with grasp 3 out 5 with decrease pad to pad LUE Sensation: decreased light touch LUE Coordination: decreased fine motor;decreased gross motor    Lower Extremity Assessment Lower Extremity Assessment: RLE deficits/detail;LLE deficits/detail RLE Deficits / Details: reports numbness in toes LLE Deficits / Details: reports numbness in toes, grossly 4-/5 compared to 4/5 on R LE    Cervical / Trunk Assessment Cervical / Trunk Assessment: Other exceptions Cervical / Trunk Exceptions: s/p ACDF   Communication   Communication: No difficulties  Cognition Arousal/Alertness: Awake/alert Behavior During Therapy: Restless                                   General Comments: perseverating on L wrist IV site asking staff to remove it. RN checked prior to session . AFter Rn leaving asking therapist to remove the IV site      General Comments General comments (skin integrity, edema, etc.): Pt with drop in BP from 154/104 to 132/94. HR 93-99, SpO2 96% on RA    Exercises     Assessment/Plan    PT Assessment Patient needs continued PT services  PT Problem List Decreased strength;Decreased range of motion;Decreased activity tolerance;Decreased balance;Decreased mobility;Decreased coordination;Decreased knowledge of use of DME;Decreased safety awareness       PT Treatment Interventions DME instruction;Gait training;Stair training;Functional mobility training;Therapeutic activities;Therapeutic exercise;Balance training    PT Goals (Current goals can be found in the Care Plan section)  Acute Rehab PT Goals Patient Stated Goal: home PT Goal Formulation: With patient Time For Goal Achievement:  09/11/19 Potential to Achieve Goals: Good    Frequency Min 4X/week   Barriers to discharge        Co-evaluation PT/OT/SLP Co-Evaluation/Treatment: Yes Reason for Co-Treatment: Complexity of the patient's impairments (multi-system involvement);Necessary to address cognition/behavior during functional activity;For patient/therapist safety;To address functional/ADL transfers PT goals addressed during session: Mobility/safety with mobility         AM-PAC PT "6 Clicks" Mobility  Outcome Measure Help needed turning from your back to your side while in a flat bed without using bedrails?: A Little Help needed moving from lying on your back to sitting on the side of a flat bed without using bedrails?: A Little Help needed moving to and from a bed to a chair (including a wheelchair)?: A Little Help needed standing up from a chair using your arms (e.g., wheelchair or bedside chair)?: A Little Help needed to walk in hospital room?: A Lot Help needed climbing 3-5 steps with a railing? : A Lot 6 Click Score: 16    End of Session Equipment Utilized During Treatment: Gait belt Activity Tolerance: Treatment limited secondary to medical complications (Comment)(limited by nausea and drop in BP) Patient left: in chair;with call bell/phone within reach;with chair alarm set Nurse Communication: Mobility status PT Visit Diagnosis: Unsteadiness on feet (R26.81);Difficulty in walking, not elsewhere classified (R26.2)    Time: 0867-6195 PT Time Calculation (  min) (ACUTE ONLY): 32 min   Charges:   PT Evaluation $PT Eval Moderate Complexity: 1 Mod PT Treatments $Gait Training: 8-22 mins        Tim [name], PT, DPT Acute Rehabilitation Services Pager #: [phone] Office #: [phone]   Tim [name] 08/28/2019, 11:50 AM

## 2019-08-28 NOTE — Progress Notes (Signed)
Inpatient Rehab Admissions Coordinator Note:   Per PT/OT recommendations, pt was screened for CIR candidacy by Estill Dooms, PT, DPT.  At this time we are recommending CIR consult.  I will place an order per our protocol.  Please contact me with questions.   Estill Dooms, PT, DPT [phone] 08/28/19 1:26 PM

## 2019-08-28 NOTE — Progress Notes (Signed)
  CONSULTANT PROGRESS NOTE  Tim [name]  OBS:[id number] DOB: Oct 18, 1975 DOA: 08/25/2019 PCP: Patient, No Pcp Per  Consulting Physician: Dr. Maurice Small  Assessment & Plan: Cervical stenosis with central cord syndrome: Moderate C3-4 stenosis with severe C4-5 stenosis due to fall s/p ACDF 5/18 by Dr. Maurice Small.  - Per primary. CIR recommended.  History of chronic combined HFrEF: This has improved significantly on echocardiogram performed 5/17 showing preserved EF and normal diastolic function. Appears euvolemic on exam. IVC normal caliber. No pulmonary edema on exam or CXR.  - Patient of Dr. [name]. Needs outpatient follow up for ischemic evaluation.  - I do not see calcifications on limited chest films and no documented CAD. He has "no showed" for outpatient ischemic work up in the past. He's having no anginal symptoms or symptoms of CHF. Not starting beta blocker with cocaine on board.   HTN:  - Continue norvasc, lisinopril. BP elevated consistently, will increase lisinopril modestly to 10mg . Note withdrawal and pain may be causing elevated BP.  Polysubstance abuse: +THC, cocaine, opioids on UDS. States cocaine must have been in the ecstasy he took, doesn't use cocaine intentionally.  - CSW consulted, cessation counseling provided.  - Continue CIWA. EtOH not checked at admission, presumably height of withdrawal period would be today into tomorrow. Currently stable.  AKI:  - SCr 1.49, improved to 1.33, CrCl wnl.  Tobacco use:  - Nicotine patch ordered - Cessation counseling provided  Obesity: BMI 30, noted.  Subjective: Got ativan this AM for CIWA score of 14, previously has been 2, 0, 4.  Objective: BP (!) 133/92 (BP Location: Right Arm)   Pulse 91   Temp 98.3 F (36.8 C) (Oral)   Resp 15   Ht 6\' 2"  (1.88 m)   Wt 107.3 kg   SpO2 95%   BMI 30.37 kg/m   Gen: 44 y.o. male in no distress Pulm: Nonlabored breathing room air. Clear. CV: Regular rate and rhythm. No murmur,  rub, or gallop. No JVD, no dependent edema. GI: Abdomen soft, non-tender, non-distended, with normoactive bowel sounds.  Ext: Warm, no deformities Skin: No rashes, lesions or ulcers on visualized skin. Anterior neck incision thru cervical collar has dermabond, no erythema or discharge.  Neuro: Alert and oriented. 4/5 LUE strength, dysesthesias improved. Psych: Judgement and insight appear fair. Mood euthymic & affect congruent. Behavior is appropriate.    Echocardiogram 08/26/2019: 1. Left ventricular ejection fraction, by estimation, is 50 to 55%. The  left ventricle has low normal function. The left ventricle has no regional  wall motion abnormalities. Left ventricular diastolic parameters were  normal.  2. Right ventricular systolic function is normal. The right ventricular  size is normal. Tricuspid regurgitation signal is inadequate for assessing  PA pressure.  3. The mitral valve is normal in structure. Trivial mitral valve  regurgitation. No evidence of mitral stenosis.  4. The aortic valve is grossly normal. Aortic valve regurgitation is not  visualized. No aortic stenosis is present.  5. The inferior vena cava is normal in size with greater than 50%  respiratory variability, suggesting right atrial pressure of 3 mmHg.    LOS: 2 days   Time spent: 25 minutes.  Marland Kitchen, MD [facility] [email or web address] 08/28/2019, 4:45 PM

## 2019-08-28 NOTE — Evaluation (Signed)
Occupational Therapy Evaluation Patient Details Name: Tim [name] MRN: [id number] DOB: 1976/01/19 Today's Date: 08/28/2019    History of Present Illness This is a 44 y.o. man that presents after a fall, +h/o polysubstance abuse, striking his face. He has had difficulty ambulating and left sided weakness with left sided burning extremity pain since that time. Pt found to have cervical stenosis and central cord syndrome and underwent a C4-5 anterior cervical decompression/discectomy Fusion on 5/18.   Clinical Impression   Patient is s/p ACDF C4-5 with cord compression surgery resulting in functional limitations due to the deficits listed below (see OT problem list). Pt currently with bil Ue decreased fine motor and sensation changes. Pt able to make basic transfer with noted decrease BP. Pt required sitting for oral care due to decreased BP.  Patient will benefit from skilled OT acutely to increase independence and safety with ADLS to allow discharge CIR.     Follow Up Recommendations  CIR    Equipment Recommendations  Other (comment)(RW)    Recommendations for Other Services Rehab consult     Precautions / Restrictions Precautions Precautions: Cervical;Fall Precaution Comments: pt educated on cervical precautions, pt with verbal understanding Required Braces or Orthoses: Cervical Brace(when up OOB) Restrictions Weight Bearing Restrictions: No      Mobility Bed Mobility Overal bed mobility: Needs Assistance Bed Mobility: Rolling;Supine to Sit Rolling: Supervision   Supine to sit: Min assist     General bed mobility comments: (A) to elevate trunk from bed surface  Transfers Overall transfer level: Needs assistance Equipment used: Rolling walker (2 wheeled) Transfers: Sit to/from Stand Sit to Stand: Min assist         General transfer comment: verbal cues to push up from bed, minA to power up due to low surface height, minA to steady during transistion of  hands    Balance Overall balance assessment: Needs assistance Sitting-balance support: Feet supported;No upper extremity supported Sitting balance-Leahy Scale: Fair     Standing balance support: Bilateral upper extremity supported Standing balance-Leahy Scale: Poor Standing balance comment: dependent on RW, pt able to stand on one leg in RW to adjust croc                           ADL either performed or assessed with clinical judgement   ADL Overall ADL's : Needs assistance/impaired     Grooming: Minimal assistance;Sitting Grooming Details (indicate cue type and reason): pt requires increased time and (A) to open tooth paste. pt with decrease ability to hold items in L hand               Lower Body Dressing Details (indicate cue type and reason): able to bring L LE to R knee in a figure 4 pattern               General ADL Comments: pt states "feeling like he wanted to vomit but that he coudl control it" pt noted to have BP decrease orthostatic     Vision Baseline Vision/History: No visual deficits Additional Comments: reports having blurred vision now since admission     Perception     Praxis      Pertinent Vitals/Pain Pain Assessment: 0-10     Hand Dominance Right   Extremity/Trunk Assessment Upper Extremity Assessment Upper Extremity Assessment: RUE deficits/detail;LUE deficits/detail RUE Deficits / Details: R forearm numbness less severe than L  grasp 4 out 5 with decrease pad to pad  RUE Sensation: decreased light touch RUE Coordination: decreased fine motor;decreased gross motor LUE Deficits / Details: L forearm numbness greater than R side pt with grasp 3 out 5 with decrease pad to pad LUE Sensation: decreased light touch LUE Coordination: decreased fine motor;decreased gross motor   Lower Extremity Assessment Lower Extremity Assessment: RLE deficits/detail;LLE deficits/detail RLE Deficits / Details: reports numbness in toes LLE  Deficits / Details: reports numbness in toes, grossly 4-/5 compared to 4/5 on R LE   Cervical / Trunk Assessment Cervical / Trunk Assessment: Other exceptions Cervical / Trunk Exceptions: s/p ACDF    Communication Communication Communication: No difficulties   Cognition Arousal/Alertness: Awake/alert Behavior During Therapy: Restless                                   General Comments: perseverating on L wrist IV site asking staff to remove it. RN checked prior to session . AFter Rn leaving asking therapist to remove the IV site . pt was not able to verbalize spinal cord injury but states "i coudl not get up as the reason for admission"   General Comments  Pt with drop in BP from 154/104 to 132/94. HR 93-99, SpO2 96% on RA    Exercises     Shoulder Instructions      Home Living Family/patient expects to be discharged to:: Private residence Living Arrangements: Non-relatives/Friends Available Help at Discharge: Friend(s) Type of Home: House Home Access: Stairs to enter Technical brewer of Steps: 7   Home Layout: One level     Bathroom Shower/Tub: Teacher, early years/pre: Handicapped height     Home Equipment: None   Additional Comments: friend and x2 kids ( 39 yo and 27 yo ) does not work. friend does not work      Prior Functioning/Environment Level of Independence: Independent                 OT Problem List: Decreased strength;Decreased activity tolerance;Decreased range of motion;Impaired balance (sitting and/or standing);Decreased coordination;Decreased cognition;Impaired sensation;Decreased safety awareness;Decreased knowledge of use of DME or AE;Impaired UE functional use      OT Treatment/Interventions: Self-care/ADL training;Therapeutic exercise;Neuromuscular education;Energy conservation;DME and/or AE instruction;Manual therapy;Therapeutic activities;Balance training;Patient/family education;Cognitive  remediation/compensation    OT Goals(Current goals can be found in the care plan section) Acute Rehab OT Goals Patient Stated Goal: home OT Goal Formulation: With patient Time For Goal Achievement: 09/11/19 Potential to Achieve Goals: Good  OT Frequency: Min 2X/week   Barriers to D/C:            Co-evaluation PT/OT/SLP Co-Evaluation/Treatment: Yes Reason for Co-Treatment: Complexity of the patient's impairments (multi-system involvement);For patient/therapist safety;To address functional/ADL transfers PT goals addressed during session: Mobility/safety with mobility OT goals addressed during session: ADL's and self-care;Proper use of Adaptive equipment and DME;Strengthening/ROM      AM-PAC OT "6 Clicks" Daily Activity     Outcome Measure Help from another person eating meals?: A Little Help from another person taking care of personal grooming?: A Little Help from another person toileting, which includes using toliet, bedpan, or urinal?: A Little Help from another person bathing (including washing, rinsing, drying)?: A Lot Help from another person to put on and taking off regular upper body clothing?: A Little Help from another person to put on and taking off regular lower body clothing?: A Lot 6 Click Score: 16   End of Session Equipment Utilized During Treatment:  Rolling walker;Gait belt Nurse Communication: Mobility status;Precautions  Activity Tolerance: Patient tolerated treatment well Patient left: in chair;with call bell/phone within reach;with chair alarm set  OT Visit Diagnosis: Unsteadiness on feet (R26.81);Muscle weakness (generalized) (M62.81)                Time: 8937-3428 OT Time Calculation (min): 35 min Charges:  OT General Charges $OT Visit: 1 Visit OT Evaluation $OT Eval Moderate Complexity: 1 Mod OT Treatments $Self Care/Home Management : 8-22 mins   Brynn, OTR/L  Acute Rehabilitation Services Pager: [phone] Office: [phone] .   Mateo Flow 08/28/2019, 12:46 PM

## 2019-08-28 NOTE — Discharge Summary (Signed)
Discharge Summary  Date of Admission: 08/25/2019  Date of Discharge: 08/29/19  Attending Physician: Autumn Patty, MD  Hospital Course: Patient presented to the ED with LUE/LLE weakness after a fall. MRI showed central canal stenosis with a cord contusion, consistent with central cord syndrome. He was taken to the OR on 08/27/19 for a C4-5 ACDF. He was recovered in PACU and transferred to 4NP. His hospital course was uncomplicated and the patient was discharged home on 08/29/19. He will follow up in clinic with me in 2 weeks.  Neurologic exam at discharge:  AOx3, PERRL, EOMI, FS, TM Strength 5/5 on R, 4/5 on L, SILTx4  Discharge diagnosis: Central cord syndrome  Jadene Pierini, MD

## 2019-08-29 MED ORDER — LISINOPRIL 10 MG PO TABS: 10.0000 mg | ORAL_TABLET | Freq: Every day | ORAL | 2 refills | Status: AC

## 2019-08-29 MED ORDER — GABAPENTIN 300 MG PO CAPS: 300.0000 mg | ORAL_CAPSULE | Freq: Three times a day (TID) | ORAL | 2 refills | Status: AC

## 2019-08-29 MED ORDER — CYCLOBENZAPRINE HCL 10 MG PO TABS: 10.0000 mg | ORAL_TABLET | Freq: Three times a day (TID) | ORAL | 2 refills | Status: AC | PRN

## 2019-08-29 MED ORDER — HEPARIN SODIUM (PORCINE) 5000 UNIT/ML IJ SOLN
5000.0000 [IU] | Freq: Three times a day (TID) | INTRAMUSCULAR | Status: DC
  Administered 2019-08-29: 5000 [IU] via SUBCUTANEOUS
  Filled 2019-08-29: qty 1

## 2019-08-29 MED ORDER — OXYCODONE HCL 10 MG PO TABS: 10.0000 mg | ORAL_TABLET | ORAL | 0 refills | Status: DC | PRN

## 2019-08-29 NOTE — Progress Notes (Signed)
Neurosurgery Service Progress Note  Subjective: No acute events overnight, dysesthesias and strength continue to improve  Objective: Vitals:   08/28/19 2320 08/29/19 0441 08/29/19 0441 08/29/19 0757  BP: (!) 140/92 (!) 136/96  (!) 144/86  Pulse: 87 83  93  Resp: (!) 22 16  18   Temp: 98.3 F (36.8 C)  98.4 F (36.9 C) 98.6 F (37 C)  TempSrc: Oral  Oral Oral  SpO2: 97% 96%  91%  Weight:      Height:       Temp (24hrs), Avg:98.6 F (37 C), Min:98.3 F (36.8 C), Max:99 F (37.2 C)  CBC Latest Ref Rng & Units 08/28/2019 08/26/2019 09/23/2017  WBC 4.0 - 10.5 K/uL 10.9(H) 6.5 6.3  Hemoglobin 13.0 - 17.0 g/dL 09/25/2017 83.3 82.5  Hematocrit 39.0 - 52.0 % 47.6 50.3 45.5  Platelets 150 - 400 K/uL 240 226 195   BMP Latest Ref Rng & Units 08/28/2019 08/26/2019 09/23/2017  Glucose 70 - 99 mg/dL 09/25/2017) 976(B) 341(P)  BUN 6 - 20 mg/dL 16 17 13   Creatinine 0.61 - 1.24 mg/dL 379(K) ) 2.40(X  Sodium 135 - 145 mmol/L 141 140 141  Potassium 3.5 - 5.1 mmol/L 4.2 4.2 4.1  Chloride 98 - 111 mmol/L 104 108 106  CO2 22 - 32 mmol/L 27 23 28   Calcium 8.9 - 10.3 mg/dL 7.35(H) 2.99) )    Intake/Output Summary (Last 24 hours) at 08/29/2019 1015 Last data filed at 08/28/2019 1456 Gross per 24 hour  Intake 250 ml  Output --  Net 250 ml    Current Facility-Administered Medications:  .  0.9 %  sodium chloride infusion, 250 mL, Intravenous, Continuous, Bobette Leyh A, MD, Last Rate: 1 mL/hr at 08/26/19 2012, 250 mL at 08/26/19 2012 .  acetaminophen (TYLENOL) tablet 650 mg, 650 mg, Oral, Q4H PRN, 650 mg at 08/27/19 2102 **OR** acetaminophen (TYLENOL) suppository 650 mg, 650 mg, Rectal, Q4H PRN, 2013, MD .  amLODipine (NORVASC) tablet 10 mg, 10 mg, Oral, Daily, Yuvaan Olander, 08/29/19, MD, 10 mg at 08/29/19 0857 .  docusate sodium (COLACE) capsule 100 mg, 100 mg, Oral, BID, Jadene Pierini, MD, 100 mg at 08/29/19 0858 .  folic acid (FOLVITE) tablet 1 mg, 1 mg, Oral, Daily,  Tymar Polyak, 08/31/19, MD, 1 mg at 08/29/19 0856 .  gabapentin (NEURONTIN) capsule 300 mg, 300 mg, Oral, TID, 08/31/19, MD, 300 mg at 08/29/19 0858 .  HYDROmorphone (DILAUDID) injection 1 mg, 1 mg, Intravenous, Q3H PRN, 08/31/19, MD, 1 mg at 08/29/19 0550 .  lactated ringers infusion, , Intravenous, Continuous, 08/31/19, MD, Last Rate: 10 mL/hr at 08/27/19 1433, New Bag at 08/27/19 1433 .  lisinopril (ZESTRIL) tablet 10 mg, 10 mg, Oral, Daily, Jadene Pierini, MD, 10 mg at 08/29/19 0857 .  LORazepam (ATIVAN) tablet 1-4 mg, 1-4 mg, Oral, Q1H PRN, 2 mg at 08/28/19 0824 **OR** LORazepam (ATIVAN) injection 1-4 mg, 1-4 mg, Intravenous, Q1H PRN, Tyrone Nine, MD, 2 mg at 08/28/19 2030 .  menthol-cetylpyridinium (CEPACOL) lozenge 3 mg, 1 lozenge, Oral, PRN **OR** phenol (CHLORASEPTIC) mouth spray 1 spray, 1 spray, Mouth/Throat, PRN, Kaheem Halleck, 08/30/19, MD .  multivitamin with minerals tablet 1 tablet, 1 tablet, Oral, Daily, Janelie Goltz, Jadene Pierini, MD, 1 tablet at 08/29/19 0857 .  nicotine (NICODERM CQ - dosed in mg/24 hours) patch 14 mg, 14 mg, Transdermal, Daily, Opyd, Clovis Pu, MD, 14 mg at 08/29/19 0029 .  ondansetron (ZOFRAN) tablet 4 mg, 4  mg, Oral, Q6H PRN **OR** ondansetron (ZOFRAN) injection 4 mg, 4 mg, Intravenous, Q6H PRN, Judith Part, MD, 4 mg at 08/26/19 2017 .  oxyCODONE (Oxy IR/ROXICODONE) immediate release tablet 10 mg, 10 mg, Oral, Q4H PRN, Judith Part, MD .  oxyCODONE (Oxy IR/ROXICODONE) immediate release tablet 15 mg, 15 mg, Oral, Q4H PRN, Judith Part, MD, 15 mg at 08/29/19 0856 .  polyethylene glycol (MIRALAX / GLYCOLAX) packet 17 g, 17 g, Oral, Daily PRN, Shaterica Mcclatchy A, MD .  sodium chloride flush (NS) 0.9 % injection 3 mL, 3 mL, Intravenous, Q12H, Dequante Tremaine, Joyice Faster, MD, 3 mL at 08/29/19 0906 .  sodium chloride flush (NS) 0.9 % injection 3 mL, 3 mL, Intravenous, PRN, Judith Part, MD .  thiamine tablet 100 mg, 100  mg, Oral, Daily, 100 mg at 08/29/19 0857 **OR** thiamine (B-1) injection 100 mg, 100 mg, Intravenous, Daily, Judith Part, MD, 100 mg at 08/27/19 1655   Physical Exam: Strength 5/5 on R, 4/5 on L with numbness and dysesthesias, +Hoffman's resolved on R, minimal on left Incision c/d/i, neck soft  Assessment & Plan: 44 y.o. man s/p fall with central cord syndrome, MRI with severe canal stenosis and cord signal change at C4-5. 5/18 s/p C4-5 ACDF  -PT/OT/PM&R recs, possible CIR transfer, medically ready for transfer from my perspective -ADAT, activity as tolerated, no brace needed -medicine recs -SCDs/TEDs, start [place]  08/29/19 10:15 AM

## 2019-08-29 NOTE — Progress Notes (Signed)
  CONSULTANT PROGRESS NOTE  Tim [name]  NLG:[id number] DOB: 01/09/1976 DOA: 08/25/2019 PCP: Patient, No Pcp Per  Consulting Physician: Dr. Maurice [name]  Assessment & Plan: Hospitalist will sign off today. Primary recommendations are for cardiology follow up as outpatient and recheck of HR, BP. DC on norvasc 10mg  and lisinopril 10mg . Continue substance abuse cessation counseling. Medically optimized for next venue of care.  Cervical stenosis with central cord syndrome: Moderate C3-4 stenosis with severe C4-5 stenosis due to fall s/p ACDF 5/18 by Dr. .  - Per primary. CIR consulted, waiting to see functional progress to inform disposition.   History of chronic combined HFrEF: This has improved significantly on echocardiogram performed 5/17 showing preserved EF and normal diastolic function. Appears euvolemic on exam. IVC normal caliber. No pulmonary edema on exam or CXR.  - Patient of Dr. Maurice [name]. Needs outpatient follow up for ischemic evaluation. Though no urgent need for consult. - I do not see calcifications on limited chest films and no documented CAD. He has "no showed" for outpatient ischemic work up in the past. He's having no anginal symptoms or symptoms of CHF. Not starting beta blocker with cocaine on board.   HTN:  - Continue norvasc, lisinopril, needs PCP follow up.  Polysubstance abuse: +THC, cocaine, opioids on UDS. States cocaine must have been in the ecstasy he took, doesn't use cocaine intentionally.  - CSW consulted, cessation counseling provided.  - Can DC CIWA protocol with continued low scores at 72 hours from admission.  AKI:  - SCr 1.49, improved to 1.33, CrCl wnl.  Tobacco use:  - Nicotine patch ordered - Cessation counseling provided  Obesity: BMI 30, noted.  Subjective: CIWA scores have been 3, 3, 0, 2, 2, we're nearing 72 hours from admission. No current evidence of withdrawal. Pain controlled.   Objective: BP (!) 144/86 (BP Location: Left Arm)    Pulse 93   Temp 98.6 F (37 C) (Oral)   Resp 18   Ht 6\' 2"  (1.88 m)   Wt 107.3 kg   SpO2 91%   BMI 30.37 kg/m  Gen: Well-appearing 43 y.o.male in NAD HEENT: MMM, posterior oropharynx clear Pulm: Non-labored; CTAB, no wheezes  CV: Regular rate, no murmur appreciated; distal pulses intact/symmetric GI: + BS; soft, non-tender, non-distended Skin: Anterior neck surgical site c/d/i without erythema/exudate Neuro: A&Ox3, CN II-XII without deficits. Dysesthesias and weakness L > RUE improved.  Echocardiogram 08/26/2019: 1. Left ventricular ejection fraction, by estimation, is 50 to 55%. The  left ventricle has low normal function. The left ventricle has no regional  wall motion abnormalities. Left ventricular diastolic parameters were  normal.  2. Right ventricular systolic function is normal. The right ventricular  size is normal. Tricuspid regurgitation signal is inadequate for assessing  PA pressure.  3. The mitral valve is normal in structure. Trivial mitral valve  regurgitation. No evidence of mitral stenosis.  4. The aortic valve is grossly normal. Aortic valve regurgitation is not  visualized. No aortic stenosis is present.  5. The inferior vena cava is normal in size with greater than 50%  respiratory variability, suggesting right atrial pressure of 3 mmHg.    LOS: 3 days   Time spent: 25 minutes.  Tim Baltimore, MD [facility] [email or web address] 08/29/2019, 8:34 AM

## 2019-08-29 NOTE — Progress Notes (Signed)
Inpatient Rehab Admissions:  Inpatient Rehab Consult received.  I met with patient at the bedside for rehabilitation assessment and to discuss goals and expectations of an inpatient rehab admission.  He is open to CIR, if needed, but will have 24/7 support from his sister at discharge (she lives with him).  He would like to see how he does with therapy and make a decision afterwards.  I will f/u with him tomorrow morning.   Signed: Shann Medal, PT, DPT Admissions Coordinator [phone] 08/29/19  12:14 PM

## 2019-08-29 NOTE — Plan of Care (Signed)

## 2019-08-29 NOTE — Discharge Instructions (Signed)
Discharge Instructions ° °No restriction in activities, slowly increase your activity back to normal.  ° °Your incision is closed with dermabond (purple glue). This will naturally fall off over the next 1-2 weeks.  ° °Okay to shower on the day of discharge. Use regular soap and water and try to be gentle when cleaning your incision.  ° °Follow up with Dr. Shalisha Clausing in 2 weeks after discharge. If you do not already have a discharge appointment, please call his office at [phone] to schedule a follow up appointment. If you have any concerns or questions, please call the office and let us know. °

## 2019-08-29 NOTE — Anesthesia Postprocedure Evaluation (Signed)
Anesthesia Post Note  Patient: RHONIN TROTT  Procedure(s) Performed: CERVICAL FOUR-FIVE ANTERIOR CERVICAL DECOMPRESSION/DISCECTOMY FUSION (N/A Neck)     Patient location during evaluation: PACU Anesthesia Type: General Level of consciousness: patient cooperative and awake Pain management: pain level controlled Vital Signs Assessment: post-procedure vital signs reviewed and stable Respiratory status: spontaneous breathing, nonlabored ventilation, respiratory function stable and patient connected to nasal cannula oxygen Cardiovascular status: blood pressure returned to baseline and stable Postop Assessment: no apparent nausea or vomiting Anesthetic complications: no    Last Vitals:  Vitals:   08/29/19 1132 08/29/19 1602  BP: 139/89 (!) 155/100  Pulse: 81   Resp: 16   Temp: 36.8 C 37.4 C  SpO2: 96% 99%    Last Pain:  Vitals:   08/29/19 1602  TempSrc: Oral  PainSc:                  Adelis Docter

## 2019-08-29 NOTE — Progress Notes (Signed)
Physical Therapy Treatment Patient Details Name: Tim [name] MRN: [id number] DOB: 20-Jul-1975 Today's Date: 08/29/2019    History of Present Illness This is a 44 y.o. man that presents after a fall, +h/o polysubstance abuse, striking his face. He has had difficulty ambulating and left sided weakness with left sided burning extremity pain since that time. Pt found to have cervical stenosis and central cord syndrome and underwent a C4-5 anterior cervical decompression/discectomy Fusion on 5/18.    PT Comments    Pt tolerates treatment well with improved ambulation distance and quality. Pt does not require physical assistance during session and demonstrates improved balance with unilateral UE support and without UE support. Pt expresses understanding of cervical precautions, electing to wear hard collar for OOB mobility (Neurosurgery notes report no brace needs). Pt is able to negotiate stairs and ambulate with supervision with unilateral UE support of railing or cane. Pt's main concern at this time is continued tingling in toes. PT updates recommendations to outpatient PT and recommends the patient receive a cane at the time of discharge to improve balance during gait and other ADLs.   Follow Up Recommendations  Outpatient PT;Supervision - Intermittent     Equipment Recommendations  Cane    Recommendations for Other Services       Precautions / Restrictions Precautions Precautions: Cervical;Fall Precaution Comments: pt educated on cervical precautions, pt with verbal understanding Required Braces or Orthoses: Cervical Brace Cervical Brace: Hard collar;Other (comment)(pt reports brace required for OOB, no brace needed per NSG) Restrictions Weight Bearing Restrictions: No    Mobility  Bed Mobility Overal bed mobility: Needs Assistance Bed Mobility: Sit to Supine       Sit to supine: Supervision      Transfers Overall transfer level: Needs assistance Equipment used:  None Transfers: Sit to/from Stand Sit to Stand: Supervision            Ambulation/Gait Ambulation/Gait assistance: Supervision Gait Distance (Feet): 200 Feet Assistive device: Standard walker;IV Pole Gait Pattern/deviations: Step-through pattern Gait velocity: functional Gait velocity interpretation: 1.31 - 2.62 ft/sec, indicative of limited community ambulator General Gait Details: pt with slight increase in lateral sway, ambulates for 15' with RW, 150 with IV pole and 35' without device, increased lateral sway without use of device, no significant balance deviations noted with UE support, able to change speed, stop abruptly, turn without LOB.   Stairs Stairs: Yes Stairs assistance: Supervision Stair Management: One rail Right;Forwards Number of Stairs: 12     Wheelchair Mobility    Modified Rankin (Stroke Patients Only)       Balance Overall balance assessment: Needs assistance Sitting-balance support: Feet supported;No upper extremity supported Sitting balance-Leahy Scale: Good Sitting balance - Comments: independent   Standing balance support: No upper extremity supported;During functional activity Standing balance-Leahy Scale: Good Standing balance comment: supervision, urinating in bathroom and hand washing                            Cognition Arousal/Alertness: Awake/alert Behavior During Therapy: WFL for tasks assessed/performed Overall Cognitive Status: Within Functional Limits for tasks assessed                                        Exercises      General Comments General comments (skin integrity, edema, etc.): BP pre-mobility 158/105, post-mobility 136/90. Pt denies symptoms of orthostasis  Pertinent Vitals/Pain Pain Assessment: No/denies pain    Home Living                      Prior Function            PT Goals (current goals can now be found in the care plan section) Acute Rehab PT  Goals Patient Stated Goal: home Progress towards PT goals: Progressing toward goals    Frequency    Min 4X/week      PT Plan Current plan remains appropriate    Co-evaluation              AM-PAC PT "6 Clicks" Mobility   Outcome Measure  Help needed turning from your back to your side while in a flat bed without using bedrails?: None Help needed moving from lying on your back to sitting on the side of a flat bed without using bedrails?: None Help needed moving to and from a bed to a chair (including a wheelchair)?: None Help needed standing up from a chair using your arms (e.g., wheelchair or bedside chair)?: None Help needed to walk in hospital room?: None Help needed climbing 3-5 steps with a railing? : None 6 Click Score: 24    End of Session   Activity Tolerance: Patient tolerated treatment well Patient left: in bed;with call bell/phone within reach Nurse Communication: Mobility status PT Visit Diagnosis: Unsteadiness on feet (R26.81);Difficulty in walking, not elsewhere classified (R26.2)     Time: 0355-9741 PT Time Calculation (min) (ACUTE ONLY): 25 min  Charges:  $Gait Training: 23-37 mins                     Zenaida Niece, PT, DPT Acute Rehabilitation Pager: [phone]    Zenaida Niece 08/29/2019, 2:58 PM

## 2019-08-29 NOTE — Progress Notes (Signed)
Inpatient Rehab Admissions Coordinator:   Note therapy recommendations updated to outpatient.  Will sign off for CIR at this time.   Estill Dooms, PT, DPT Admissions Coordinator [phone] 08/29/19  4:15 PM

## 2019-09-18 ENCOUNTER — Encounter: Payer: Self-pay | Admitting: Physical Medicine and Rehabilitation

## 2019-11-01 ENCOUNTER — Encounter: Payer: Self-pay | Attending: Physical Medicine and Rehabilitation | Admitting: Physical Medicine and Rehabilitation

## 2020-04-02 IMAGING — DX DG WRIST COMPLETE 3+V*L*
4 series · 4 of 4 positions shown · non-contrast
Comparison: None.

CLINICAL DATA: Laceration to the posterior left wrist with pain at
the fourth and fifth metacarpal area.

EXAM:
LEFT WRIST - COMPLETE 3+ VIEW

[wrist ap (1 of 2)]
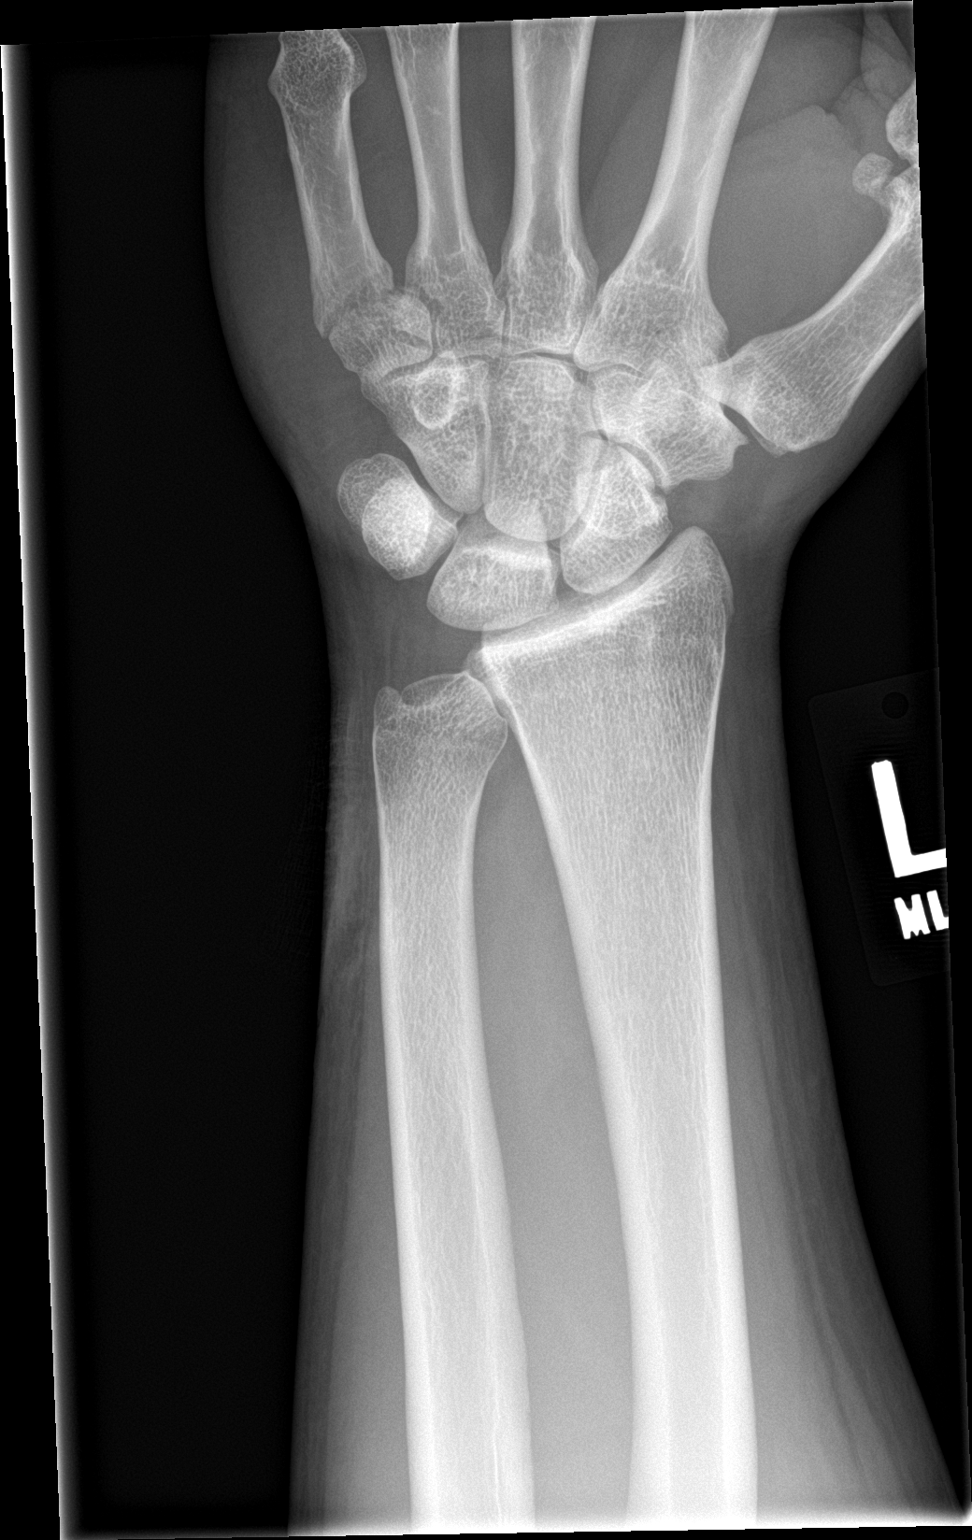

[wrist obl]
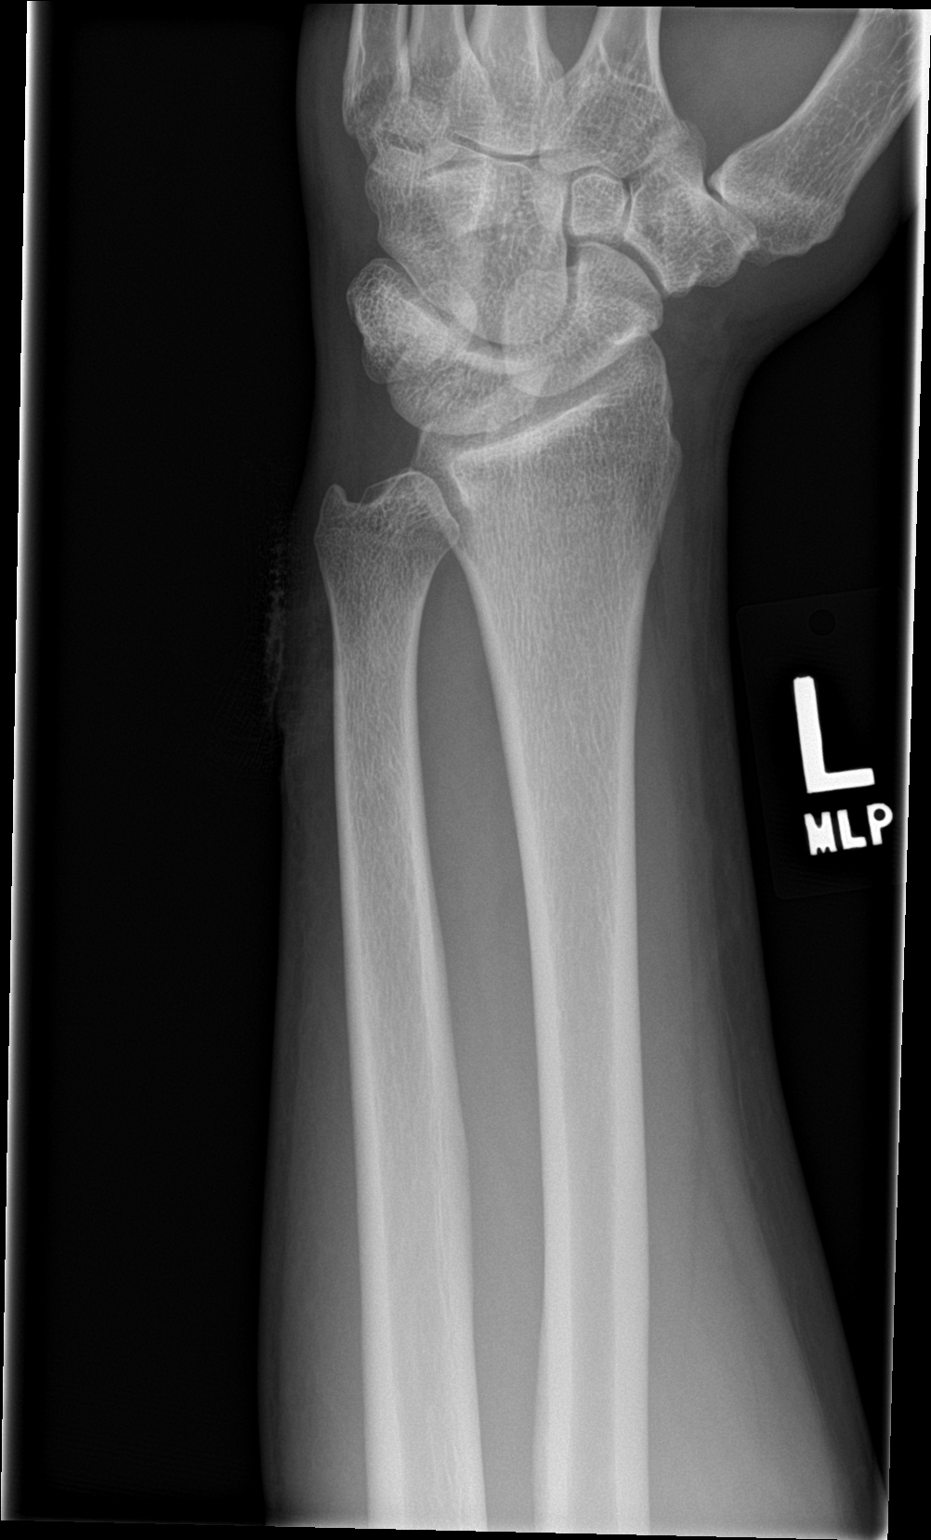

[wrist tunnel]
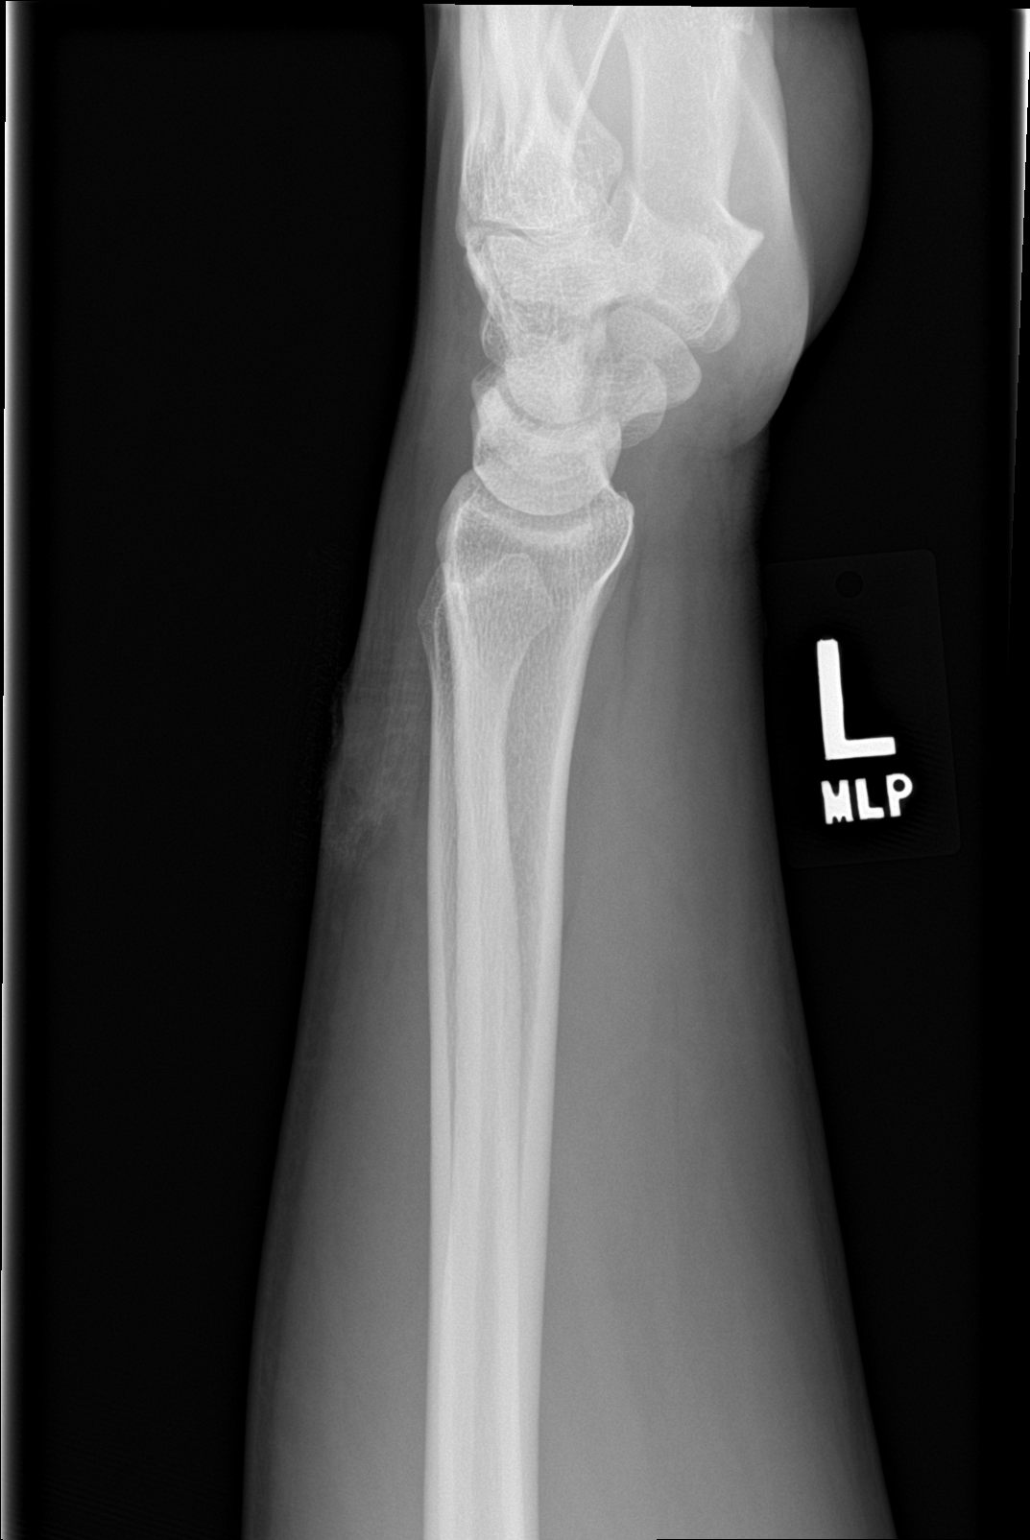

[wrist ap (2 of 2)]
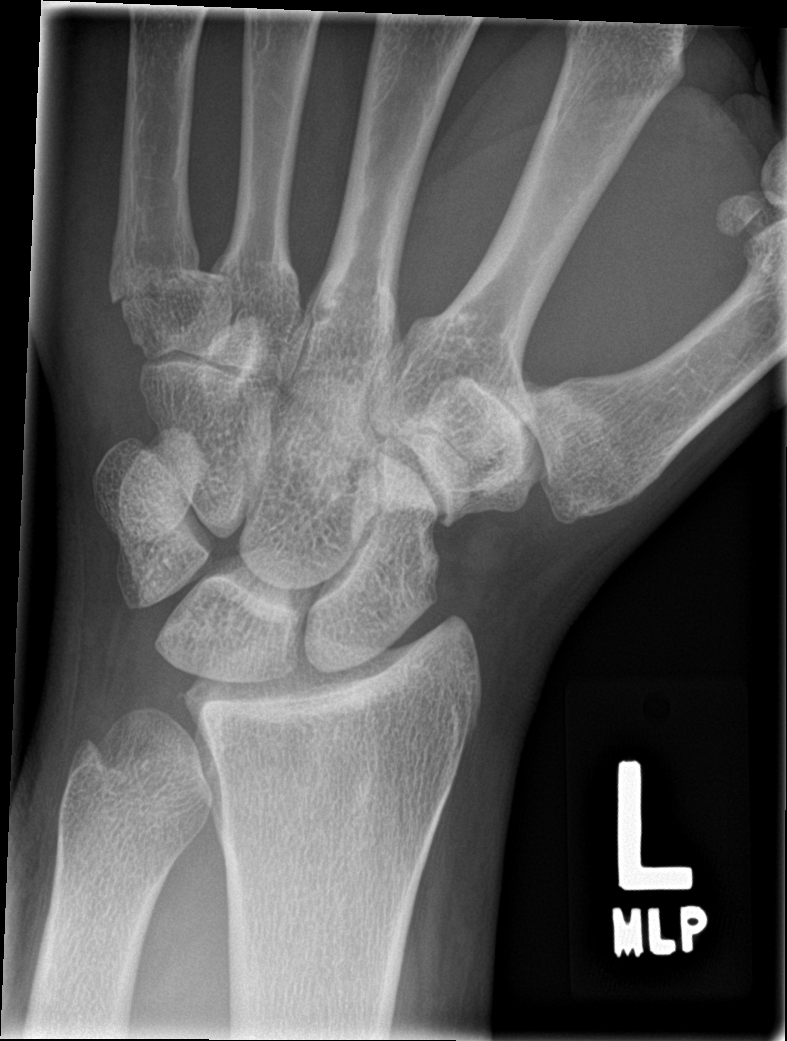

[4 of 4 positions shown; findings below may reference images not displayed]

FINDINGS: There is a transverse fracture of the proximal left fifth metacarpal
bone with mild medial displacement of the distal fracture fragment.
Soft tissue swelling over the dorsum of the left wrist. Bandage
material is present. No radiopaque soft tissue foreign bodies.
IMPRESSION: Fracture of the proximal left fifth metacarpal bone. No radiopaque
soft tissue foreign bodies.

## 2020-04-09 ENCOUNTER — Emergency Department (HOSPITAL_COMMUNITY)
Admission: EM | Admit: 2020-04-09 | Discharge: 2020-04-09 | Disposition: A | Discharge status: Home / Self Care | Payer: Self-pay | Attending: Emergency Medicine | Admitting: Emergency Medicine

## 2020-04-09 ENCOUNTER — Other Ambulatory Visit: Payer: Self-pay

## 2020-04-09 ENCOUNTER — Encounter (HOSPITAL_COMMUNITY): Payer: Self-pay

## 2020-04-09 DIAGNOSIS — R519 Headache, unspecified: Secondary | ICD-10-CM | POA: Insufficient documentation

## 2020-04-09 DIAGNOSIS — R0981 Nasal congestion: Secondary | ICD-10-CM | POA: Insufficient documentation

## 2020-04-09 DIAGNOSIS — Z5321 Procedure and treatment not carried out due to patient leaving prior to being seen by health care provider: Secondary | ICD-10-CM | POA: Insufficient documentation

## 2020-04-09 NOTE — ED Triage Notes (Signed)
Pt presents with c/o headache for 2 days. Pt also reports that one of his wisdom teeth broke off several days ago and he is having facial pain at this time as well. Pt also c/o nasal congestion and some body aches.

## 2020-04-27 IMAGING — DX DG HAND COMPLETE 3+V*L*
3 series · 3 of 3 positions shown · non-contrast
Comparison: 08/06/2017

CLINICAL DATA: Fracture 1 month ago, metal bed railing fell off
truck and struck his LEFT hand while moving furniture, pain at
fourth and fifth metacarpals

EXAM:
LEFT HAND - COMPLETE 3+ VIEW

[hand pa]
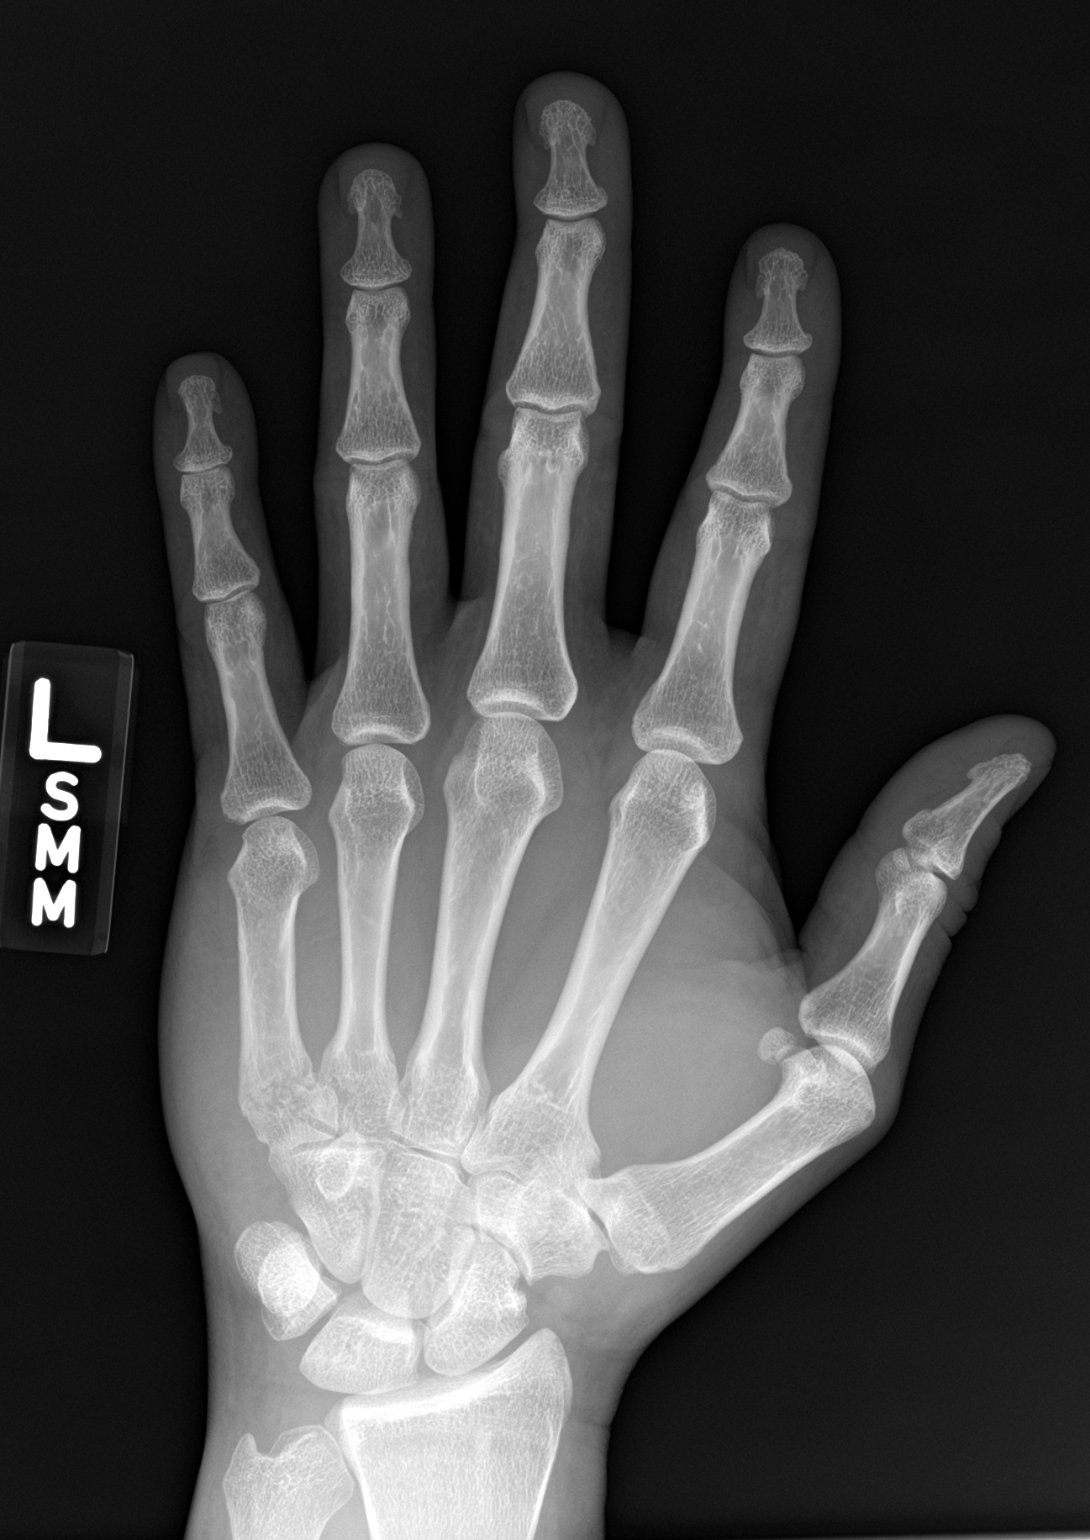

[hand obl]
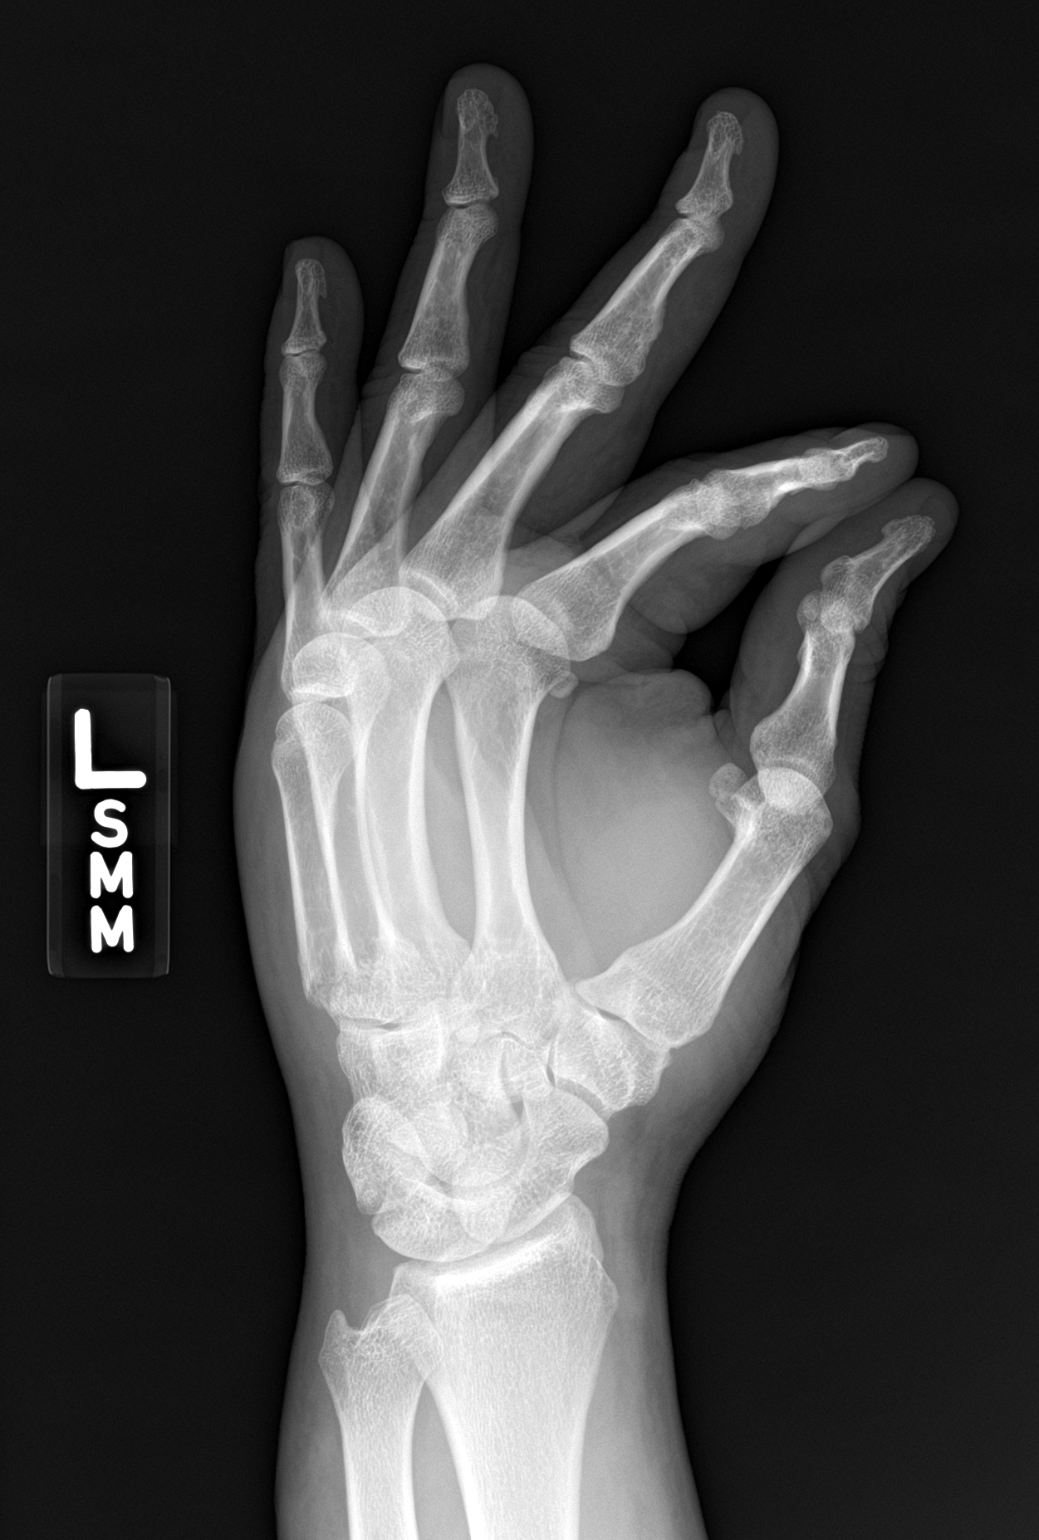

[hand lat]
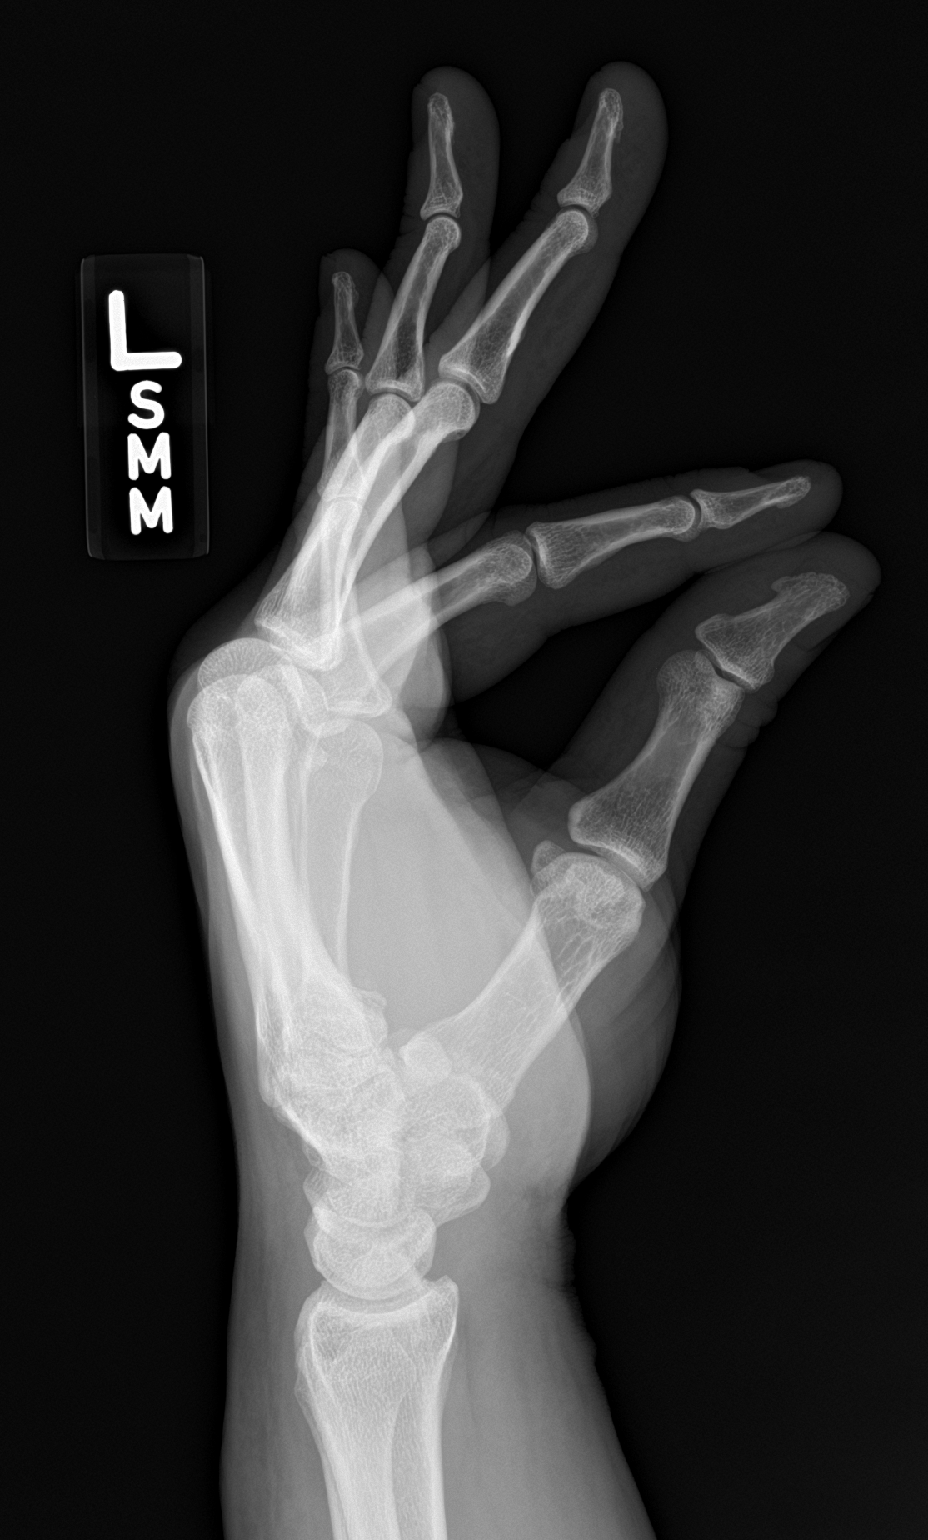

[3 of 3 positions shown; findings below may reference images not displayed]

FINDINGS: Osseous mineralization normal.

Joint spaces preserved.

Subacute minimally displaced fracture identified at base of fifth
metacarpal.

No additional fracture, dislocation, or bone destruction.
IMPRESSION: Subacute minimally displaced fracture at base of LEFT fifth
metacarpal.

## 2020-08-17 ENCOUNTER — Encounter (HOSPITAL_COMMUNITY): Payer: Self-pay

## 2020-08-17 ENCOUNTER — Other Ambulatory Visit: Payer: Self-pay

## 2020-08-17 ENCOUNTER — Ambulatory Visit (HOSPITAL_COMMUNITY)
Admission: EM | Admit: 2020-08-17 | Discharge: 2020-08-17 | Disposition: A | Discharge status: Home / Self Care | Payer: Self-pay | Attending: Emergency Medicine | Admitting: Emergency Medicine

## 2020-08-17 DIAGNOSIS — L0211 Cutaneous abscess of neck: Secondary | ICD-10-CM

## 2020-08-17 MED ORDER — DOXYCYCLINE HYCLATE 100 MG PO CAPS
100.0000 mg | ORAL_CAPSULE | Freq: Two times a day (BID) | ORAL | 0 refills | Status: DC
Start: 2020-08-17 — End: 2020-11-01

## 2020-08-17 NOTE — Discharge Instructions (Addendum)
Take doxycycline twice a day for the next 7 days  Hold warm compresses to site at least 4 times a day to help promote drainage, the more you do it the better  Can return urgent care for follow-up for increased pain, increased swelling, area still draining after antibiotic use, if you do not feel like it is properly healing

## 2020-08-17 NOTE — ED Triage Notes (Signed)
Pt presents with insect bite to right side behind his ear X 4 days.

## 2020-08-18 NOTE — ED Provider Notes (Signed)
[facility]    CSN: [id number] Arrival date & time: 08/17/20  1858      History   Chief Complaint Chief Complaint  Patient presents with  . Insect Bite    HPI Tim [name] is a 45 y.o. male.   Patient presents with possible insect bite behind right ear that occurred 4 days ago.  States that while he did not feel the bug bite him, he did see a brown spider crawling on his shirt and killed it.  Area has gotten bigger over the last 4 days and is mildly tender.  Area did open and was draining pus and blood.  Denies pruritus,redness, fever, chills.  Has not attempted treatment of the area.  Past Medical History:  Diagnosis Date  . Asthma   . Chronic combined systolic (congestive) and diastolic (congestive) heart failure (HCC)   . Coronary artery disease   . Hypertension   . Noncompliance   . Polysubstance abuse [facility])     Patient Active Problem List   Diagnosis Date Noted  . Cervical stenosis of spine   . Cord compression (HCC)   . Leukopenia   . Central cord syndrome (HCC) 08/26/2019  . Chronic combined systolic (congestive) and diastolic (congestive) heart failure (HCC) 08/26/2019  . Obesity (BMI 30.0-34.9) 08/26/2019  . Tobacco dependence 08/26/2019  . Dilated cardiomyopathy (HCC) 10/04/2017  . Polysubstance abuse (HCC) 10/04/2017  . OD (overdose of drug) 09/22/2017  . Elevated troponin 09/22/2017  . Essential hypertension     Past Surgical History:  Procedure Laterality Date  . ANTERIOR CERVICAL DECOMP/DISCECTOMY FUSION N/A 08/27/2019   Procedure: CERVICAL FOUR-FIVE ANTERIOR CERVICAL DECOMPRESSION/DISCECTOMY FUSION;  Surgeon: Jadene Pierini, MD;  Location: [facility] OR;  Service: Neurosurgery;  Laterality: N/A;  . TRANSTHORACIC ECHOCARDIOGRAM  09/23/2017   EF 20 to 25% with diffuse hypokinesis.  GR 1 DD.  Kinesis was more noted in the anterior and anterolateral wall  . WISDOM TOOTH EXTRACTION         Home Medications    Prior to Admission  medications   Medication Sig Start Date End Date Taking? Authorizing Provider  doxycycline (VIBRAMYCIN) 100 MG capsule Take 1 capsule (100 mg total) by mouth 2 (two) times daily. 08/17/20  Yes Jaleen Finch R, NP  amLODipine (NORVASC) 10 MG tablet Take 1 tablet (10 mg total) by mouth daily. 03/30/19   Elvina Sidle, MD  cyclobenzaprine (FLEXERIL) 10 MG tablet Take 1 tablet (10 mg total) by mouth 3 (three) times daily as needed for muscle spasms. 08/29/19   Jadene Pierini, MD  gabapentin (NEURONTIN) 300 MG capsule Take 1 capsule (300 mg total) by mouth 3 (three) times daily. 08/29/19   Jadene Pierini, MD  lisinopril (ZESTRIL) 10 MG tablet Take 1 tablet (10 mg total) by mouth daily. 08/30/19   Jadene Pierini, MD  oxyCODONE 10 MG TABS Take 1 tablet (10 mg total) by mouth every 4 (four) hours as needed (pain). 08/29/19   Jadene Pierini, MD    Family History Family History  Problem Relation Age of Onset  . Hypertension Mother   . Alcoholism Father     Social History Social History   Tobacco Use  . Smoking status: Current Every Day Smoker    Packs/day: 1.00    Years: 29.00    Pack years: 29.00    Types: Cigarettes  . Smokeless tobacco: Never Used  Vaping Use  . Vaping Use: Never used  Substance Use Topics  . Alcohol  use: Yes    Comment: occassionally, intermittent heavy drinking  . Drug use: Not Currently    Types: Cocaine, Marijuana    Comment: "barely" uses marijuana, denies cocaine (prior UDS + cocaine and opiates     Allergies   Patient has no known allergies.   Review of Systems Review of Systems  Constitutional: Negative.   HENT: Negative.   Respiratory: Negative.   Cardiovascular: Negative.   Skin: Positive for wound. Negative for color change, pallor and rash.     Physical Exam Triage Vital Signs ED Triage Vitals  Enc Vitals Group     BP 08/17/20 2018 (!) 149/99     Pulse Rate 08/17/20 2018 88     Resp 08/17/20 2018 18     Temp 08/17/20  2018 98.9 F (37.2 C)     Temp Source 08/17/20 2018 Oral     SpO2 08/17/20 2018 100 %     Weight --      Height --      Head Circumference --      Peak Flow --      Pain Score 08/17/20 2017 7     Pain Loc --      Pain Edu? --      Excl. in GC? --    No data found.  Updated Vital Signs BP (!) 149/99 (BP Location: Left Arm)   Pulse 88   Temp 98.9 F (37.2 C) (Oral)   Resp 18   SpO2 100%   Visual Acuity Right Eye Distance:   Left Eye Distance:   Bilateral Distance:    Right Eye Near:   Left Eye Near:    Bilateral Near:     Physical Exam Constitutional:      Appearance: Normal appearance. He is normal weight.  HENT:     Head: Normocephalic.      Comments: Flesh tone abscess behind right ear with mild tenderness and swelling, no erythema noted, nondraining at this time, no bite mark or point of entry noted Eyes:     Extraocular Movements: Extraocular movements intact.  Pulmonary:     Effort: Pulmonary effort is normal.  Musculoskeletal:        General: Normal range of motion.     Cervical back: Normal range of motion.  Neurological:     General: No focal deficit present.     Mental Status: He is alert and oriented to person, place, and time. Mental status is at baseline.  Psychiatric:        Mood and Affect: Mood normal.        Behavior: Behavior normal.        Thought Content: Thought content normal.      UC Treatments / Results  Labs (all labs ordered are listed, but only abnormal results are displayed) Labs Reviewed - No data to display  EKG   Radiology No results found.  Procedures Incision and Drainage  Date/Time: 08/18/2020 8:29 AM Performed by: Valinda Hoar, NP Authorized by: Valinda Hoar, NP   Consent:    Consent obtained:  Verbal   Consent given by:  Patient   Risks, benefits, and alternatives were discussed: yes     Risks discussed:  Bleeding, infection and pain   Alternatives discussed:  No treatment Universal protocol:     Procedure explained and questions answered to patient or proxy's satisfaction: yes     Patient identity confirmed:  Verbally with patient Location:    Type:  Abscess  Location:  Neck   Neck location:  R posterior Pre-procedure details:    Skin preparation:  Chlorhexidine with alcohol Sedation:    Sedation type:  None Anesthesia:    Anesthesia method:  Local infiltration   Local anesthetic:  Lidocaine 1% w/o epi Procedure type:    Complexity:  Simple Procedure details:    Ultrasound guidance: no     Needle aspiration: no     Incision types:  Single straight   Drainage:  Bloody   Drainage amount:  Scant   Wound treatment:  Wound left open   Packing materials:  None Post-procedure details:    Procedure completion:  Tolerated   (including critical care time)  Medications Ordered in UC Medications - No data to display  Initial Impression / Assessment and Plan / UC Course  I have reviewed the triage vital signs and the nursing notes.  Pertinent labs & imaging results that were available during my care of the patient were reviewed by me and considered in my medical decision making (see chart for details).  Abscess of neck  1.  Discussed with patient that area is not consistent with a typical descriptors of a spider bite and while that is a possibility that I believe that is low suspicion for the etiology of the site 2.  Doxycycline 100 mg twice daily for 7 days 3.  Warm compresses at least 4 times a day 4.  Strict return precautions given for worsening infection , verbalized understanding for when to return for reevaluation Final Clinical Impressions(s) / UC Diagnoses   Final diagnoses:  Abscess, neck     Discharge Instructions     Take doxycycline twice a day for the next 7 days  Hold warm compresses to site at least 4 times a day to help promote drainage, the more you do it the better  Can return urgent care for follow-up for increased pain, increased swelling,  area still draining after antibiotic use, if you do not feel like it is properly healing   ED Prescriptions    Medication Sig Dispense Auth. Provider   doxycycline (VIBRAMYCIN) 100 MG capsule Take 1 capsule (100 mg total) by mouth 2 (two) times daily. 14 capsule Triana Coover, Elita Boone, NP     PDMP not reviewed this encounter.   Valinda Hoar, NP 08/18/20 0830

## 2020-11-01 ENCOUNTER — Other Ambulatory Visit: Payer: Self-pay

## 2020-11-01 ENCOUNTER — Encounter (HOSPITAL_COMMUNITY): Payer: Self-pay | Admitting: Emergency Medicine

## 2020-11-01 ENCOUNTER — Ambulatory Visit (HOSPITAL_COMMUNITY)
Admission: EM | Admit: 2020-11-01 | Discharge: 2020-11-01 | Disposition: A | Discharge status: Home / Self Care | Payer: Self-pay | Attending: Emergency Medicine | Admitting: Emergency Medicine

## 2020-11-01 DIAGNOSIS — K029 Dental caries, unspecified: Secondary | ICD-10-CM

## 2020-11-01 DIAGNOSIS — K047 Periapical abscess without sinus: Secondary | ICD-10-CM

## 2020-11-01 MED ORDER — LIDOCAINE VISCOUS HCL 2 % MT SOLN: 15.0000 mL | OROMUCOSAL | 0 refills | Status: AC | PRN

## 2020-11-01 MED ORDER — KETOROLAC TROMETHAMINE 30 MG/ML IJ SOLN
INTRAMUSCULAR | Status: AC
  Filled 2020-11-01: qty 1

## 2020-11-01 MED ORDER — KETOROLAC TROMETHAMINE 30 MG/ML IJ SOLN
30.0000 mg | Freq: Once | INTRAMUSCULAR | Status: AC
  Administered 2020-11-01: 30 mg via INTRAMUSCULAR

## 2020-11-01 MED ORDER — AMOXICILLIN-POT CLAVULANATE 875-125 MG PO TABS
1.0000 | ORAL_TABLET | Freq: Two times a day (BID) | ORAL | 0 refills | Status: DC
Start: 2020-11-01 — End: 2021-06-30

## 2020-11-01 NOTE — ED Triage Notes (Signed)
Pt presents with dental pain xs 3-4 weeks. States unable to eat due to the pain.

## 2020-11-01 NOTE — ED Provider Notes (Signed)
[facility]   [id number] 11/01/20 Arrival Time: 1658  CC: DENTAL pain  SUBJECTIVE:  Judie GrieveOctavius K Stratton is a 45 y.o. male who presents with dental pain x 3-4 days. Reports broken tooth about 3-4 weeks ago. Localizes pain to right jaw. Has tried OTC analgesics without relief. Worse with chewing. Does not see a dentist regularly. Denies similar symptoms in the past. Denies fever, chills, dysphagia, odynophagia, oral or neck swelling, nausea, vomiting, chest pain, SOB.    ROS: As per HPI.  All other pertinent ROS negative.     Past Medical History:  Diagnosis Date   Asthma    Chronic combined systolic (congestive) and diastolic (congestive) heart failure (HCC)    Coronary artery disease    Hypertension    Noncompliance    Polysubstance abuse [facility](HCC)    Past Surgical History:  Procedure Laterality Date   ANTERIOR CERVICAL DECOMP/DISCECTOMY FUSION N/A 08/27/2019   Procedure: CERVICAL FOUR-FIVE ANTERIOR CERVICAL DECOMPRESSION/DISCECTOMY FUSION;  Surgeon: Jadene Pierinistergard, Thomas A, MD;  Location: [facility] OR;  Service: Neurosurgery;  Laterality: N/A;   TRANSTHORACIC ECHOCARDIOGRAM  09/23/2017   EF 20 to 25% with diffuse hypokinesis.  GR 1 DD.  Kinesis was more noted in the anterior and anterolateral wall   WISDOM TOOTH EXTRACTION     No Known Allergies No current facility-administered medications on file prior to encounter.   Current Outpatient Medications on File Prior to Encounter  Medication Sig Dispense Refill   amLODipine (NORVASC) 10 MG tablet Take 1 tablet (10 mg total) by mouth daily. 30 tablet 11   cyclobenzaprine (FLEXERIL) 10 MG tablet Take 1 tablet (10 mg total) by mouth 3 (three) times daily as needed for muscle spasms. 30 tablet 2   gabapentin (NEURONTIN) 300 MG capsule Take 1 capsule (300 mg total) by mouth 3 (three) times daily. 90 capsule 2   lisinopril (ZESTRIL) 10 MG tablet Take 1 tablet (10 mg total) by mouth daily. 30 tablet 2   oxyCODONE 10 MG TABS Take 1 tablet (10 mg  total) by mouth every 4 (four) hours as needed (pain). 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     Off & on Smoker - 19 yrs ~1/2 - 1 PPD   Currently unemployed - having a hard time finding a "good fit".   He currently lives with his brother & is working on his GED.   Admits to Cocaine (not Crack) use.   Social Determinants of Health   Financial Resource Strain: Not on file  Food Insecurity: Not on  file  Transportation Needs: Not on file  Physical Activity: Not on file  Stress: Not on file  Social Connections: Not on file  Intimate Partner Violence: Not on file   Family History  Problem Relation Age of Onset   Hypertension Mother    Alcoholism Father     OBJECTIVE:  Vitals:   11/01/20 1806  BP: (!) 148/98  Pulse: 80  Resp: 18  Temp: 98.9 F (37.2 C)  TempSrc: Oral  SpO2: 98%    General appearance: alert; no distress HENT: normocephalic; atraumatic; dentition: multiple carries; abscess present- right upper over right lower gums without areas of fluctuance Neck: supple without LAD Lungs: normal respirations Skin: warm and dry Psychological: alert and cooperative;  normal mood and affect  ASSESSMENT & PLAN:  1. Dental caries   2. Dental abscess     Meds ordered this encounter  Medications   ketorolac (TORADOL) 30 MG/ML injection 30 mg   amoxicillin-clavulanate (AUGMENTIN) 875-125 MG tablet    Sig: Take 1 tablet by mouth every 12 (twelve) hours.    Dispense:  14 tablet    Refill:  0    Order Specific Question:   Supervising Provider    Answer:   Merrilee Jansky [[id number]]   lidocaine (XYLOCAINE) 2 % solution    Sig: Use as directed 15 mLs in the mouth or throat as needed for mouth pain.    Dispense:  100 mL    Refill:  0    Order Specific Question:   Supervising Provider    Answer:   Merrilee Jansky [id number]    @CSR @  Augmentin prescribed Ibuprofen prescribed.  Use as directed for pain relief Recommend soft diet until evaluated by dentist Maintain oral hygiene care Follow up with dentist as soon as possible for further evaluation and treatment  Return or go to the ED if you have any new or worsening symptoms such as fever, chills, difficulty swallowing, painful swallowing, oral or neck swelling, nausea, vomiting, chest pain, SOB.  Reviewed expectations re: course of current medical issues. Questions answered. Outlined signs and symptoms indicating need for more acute intervention. Patient verbalized understanding. After Visit Summary given.  [facility]    CSN: Arrival date & time: 11/01/20  1658      History   Chief Complaint Chief Complaint  Patient presents with   Dental Pain    HPI SHJON LIZARRAGA is a 45 y.o. male.   HPI  Past Medical History:  Diagnosis Date   Asthma    Chronic combined systolic (congestive) and diastolic (congestive) heart failure (HCC)    Coronary artery disease    Hypertension    Noncompliance    Polysubstance abuse [facility])     Patient Active Problem List   Diagnosis Date Noted   Cervical stenosis of spine    Cord compression (HCC)    Leukopenia     Central cord syndrome (HCC) 08/26/2019   Chronic combined systolic (congestive) and diastolic (congestive) heart failure (HCC) 08/26/2019   Obesity (BMI 30.0-34.9) 08/26/2019   Tobacco dependence 08/26/2019   Dilated cardiomyopathy (HCC) 10/04/2017   Polysubstance abuse (HCC) 10/04/2017   OD (overdose of drug) 09/22/2017   Elevated troponin 09/22/2017   Essential hypertension     Past Surgical History:  Procedure Laterality Date   ANTERIOR CERVICAL DECOMP/DISCECTOMY FUSION N/A 08/27/2019   Procedure: CERVICAL FOUR-FIVE ANTERIOR CERVICAL DECOMPRESSION/DISCECTOMY FUSION;  Surgeon: 08/29/2019, MD;  Location: [facility] OR;  Service: Neurosurgery;  Laterality:  N/A;   TRANSTHORACIC ECHOCARDIOGRAM  09/23/2017   EF 20 to 25% with diffuse hypokinesis.  GR 1 DD.  Kinesis was more noted in the anterior and anterolateral wall   WISDOM TOOTH EXTRACTION         Home Medications    Prior to Admission medications   Medication Sig Start Date End Date Taking? Authorizing Provider  amoxicillin-clavulanate (AUGMENTIN) 875-125 MG tablet Take 1 tablet by mouth every 12 (twelve) hours. 11/01/20  Yes Ivette Loyal, NP  lidocaine (XYLOCAINE) 2 % solution Use as directed 15 mLs in the mouth or throat as needed for mouth pain. 11/01/20  Yes Ivette Loyal, NP  amLODipine (NORVASC) 10 MG tablet Take 1 tablet (10 mg total) by mouth daily. 03/30/19   Elvina Sidle, MD  cyclobenzaprine (FLEXERIL) 10 MG tablet Take 1 tablet (10 mg total) by mouth 3 (three) times daily as needed for muscle spasms. 08/29/19   Jadene Pierini, MD  gabapentin (NEURONTIN) 300 MG capsule Take 1 capsule (300 mg total) by mouth 3 (three) times daily. 08/29/19   Jadene Pierini, MD  lisinopril (ZESTRIL) 10 MG tablet Take 1 tablet (10 mg total) by mouth daily. 08/30/19   Jadene Pierini, MD  oxyCODONE 10 MG TABS Take 1 tablet (10 mg total) by mouth every 4 (four) hours as needed (pain). 08/29/19   Jadene Pierini, MD     Family History Family History  Problem Relation Age of Onset   Hypertension Mother    Alcoholism Father     Social History Social History   Tobacco Use   Smoking status: Every Day    Packs/day: 1.00    Years: 29.00    Pack years: 29.00    Types: Cigarettes   Smokeless tobacco: Never  Vaping Use   Vaping Use: Never used  Substance Use Topics   Alcohol use: Yes    Comment: occassionally, intermittent heavy drinking   Drug use: Not Currently    Types: Cocaine, Marijuana    Comment: "barely" uses marijuana, denies cocaine (prior UDS + cocaine and opiates     Allergies   Patient has no known allergies.   Review of Systems Review of Systems   Physical Exam Triage Vital Signs ED Triage Vitals  Enc Vitals Group     BP 11/01/20 1806 (!) 148/98     Pulse Rate 11/01/20 1806 80     Resp 11/01/20 1806 18     Temp 11/01/20 1806 98.9 F (37.2 C)     Temp Source 11/01/20 1806 Oral     SpO2 11/01/20 1806 98 %     Weight --      Height --      Head Circumference --      Peak Flow --      Pain Score 11/01/20 1803 10     Pain Loc --      Pain Edu? --      Excl. in GC? --    No data found.  Updated Vital Signs BP (!) 148/98 (BP Location: Left Arm)   Pulse 80   Temp 98.9 F (37.2 C) (Oral)   Resp 18   SpO2 98%   Visual Acuity Right Eye Distance:   Left Eye Distance:   Bilateral Distance:    Right Eye Near:   Left Eye Near:    Bilateral Near:     Physical Exam   UC Treatments / Results  Labs (all labs ordered are listed, but  only abnormal results are displayed) Labs Reviewed - No data to display  EKG   Radiology No results found.  Procedures Procedures (including critical care time)  Medications Ordered in UC Medications  ketorolac (TORADOL) 30 MG/ML injection 30 mg (30 mg Intramuscular Given 11/01/20 1829)    Initial Impression / Assessment and Plan / UC Course  I have reviewed the triage vital signs and the nursing notes.  Pertinent  labs & imaging results that were available during my care of the patient were reviewed by me and considered in my medical decision making (see chart for details).    Assessment negative for red flags or concerns.   Augmentin prescribed Recommend Ibuprofen and/or Tylenol as needed.  Prescribed viscous lidocaine for pain relief.  Toradol given in office for pain relief.   Recommend soft diet until evaluated by dentist Maintain oral hygiene care Follow up with dentist as soon as possible for further evaluation and treatment and given dental resource sheet Return or go to the ED if you have any new or worsening symptoms such as fever, chills, difficulty swallowing, painful swallowing, oral or neck swelling, nausea, vomiting, chest pain, SOB.  Reviewed expectations re: course of current medical issues. Questions answered. Outlined signs and symptoms indicating need for more acute intervention. Patient verbalized understanding. After Visit Summary given.  Final Clinical Impressions(s) / UC Diagnoses   Final diagnoses:  Dental caries  Dental abscess     Discharge Instructions      Take the Augmentin twice a day for the next 7 days.   Take Ibuprofen and/or Tylenol as needed. You can also use the viscous lidocaine for pain relief.  Stick with a soft diet until evaluated by dentist Maintain oral hygiene care  Follow up with dentist as soon as possible for further evaluation and treatment  Return or go to the ED if you have any new or worsening symptoms such as fever, chills, difficulty swallowing, painful swallowing, oral or neck swelling, nausea, vomiting, chest pain, SOB.      ED Prescriptions     Medication Sig Dispense Auth. Provider   amoxicillin-clavulanate (AUGMENTIN) 875-125 MG tablet Take 1 tablet by mouth every 12 (twelve) hours. 14 tablet Chales Salmon R, NP   lidocaine (XYLOCAINE) 2 % solution Use as directed 15 mLs in the mouth or throat as needed for mouth pain. 100 mL  Ivette Loyal, NP      PDMP not reviewed this encounter.   Ivette Loyal, NP 11/01/20 [phone]

## 2020-11-01 NOTE — Discharge Instructions (Addendum)
Take the Augmentin twice a day for the next 7 days.   Take Ibuprofen and/or Tylenol as needed. You can also use the viscous lidocaine for pain relief.  Stick with a soft diet until evaluated by dentist Maintain oral hygiene care  Follow up with dentist as soon as possible for further evaluation and treatment  Return or go to the ED if you have any new or worsening symptoms such as fever, chills, difficulty swallowing, painful swallowing, oral or neck swelling, nausea, vomiting, chest pain, SOB.

## 2021-02-06 ENCOUNTER — Ambulatory Visit (HOSPITAL_COMMUNITY)
Admission: EM | Admit: 2021-02-06 | Discharge: 2021-02-06 | Disposition: A | Discharge status: Home / Self Care | Payer: Self-pay | Attending: Emergency Medicine | Admitting: Emergency Medicine

## 2021-02-06 ENCOUNTER — Other Ambulatory Visit: Payer: Self-pay

## 2021-02-06 ENCOUNTER — Encounter (HOSPITAL_COMMUNITY): Payer: Self-pay

## 2021-02-06 DIAGNOSIS — M542 Cervicalgia: Secondary | ICD-10-CM

## 2021-02-06 DIAGNOSIS — J029 Acute pharyngitis, unspecified: Secondary | ICD-10-CM

## 2021-02-06 HISTORY — DX: Essential (primary) hypertension: I10

## 2021-02-06 HISTORY — DX: Unspecified asthma, uncomplicated: J45.909

## 2021-02-06 LAB — POCT RAPID STREP A, ED / UC: Streptococcus, Group A Screen (Direct): NEGATIVE

## 2021-02-06 MED ORDER — KETOROLAC TROMETHAMINE 30 MG/ML IJ SOLN
30.0000 mg | Freq: Once | INTRAMUSCULAR | Status: AC
Start: 2021-02-06 — End: 2021-02-06
  Administered 2021-02-06: 30 mg via INTRAMUSCULAR

## 2021-02-06 MED ORDER — KETOROLAC TROMETHAMINE 30 MG/ML IJ SOLN
INTRAMUSCULAR | Status: AC
  Filled 2021-02-06: qty 1

## 2021-02-06 NOTE — ED Triage Notes (Signed)
Pt presents with sore throat X 2 days; pt also complains of bilateral arm pain that he believes is from a surgery he had.

## 2021-02-06 NOTE — Discharge Instructions (Signed)

## 2021-02-06 NOTE — ED Provider Notes (Signed)
[facility]    CSN: [id number] Arrival date & time: 02/06/21  1658      History   Chief Complaint Chief Complaint  Patient presents with   Sore Throat   Arm Pain    HPI Nikki Rusnak is a 45 y.o. male.   Patient here for evaluation of sore throat that has been ongoing for the past 2 days.  Patient reports throat is painful to swallow but he is managing secretions at this time.  Patient also requesting something for pain.  Reports having spinal surgery approximately 1 year ago that "messed up his nerves".  Reports pain in the back of his neck that radiates down his arms.  Reports initially being given oxycodone and then switched to gabapentin but states that was not effective.  Reports that he has been unable to be seen by his spinal surgery provider.  Denies any trauma, injury, or other precipitating event.  Denies any fevers, chest pain, shortness of breath, N/V/D, numbness, tingling, weakness, abdominal pain, or headaches.    The history is provided by the patient.  Sore Throat   Past Medical History:  Diagnosis Date   Asthma    Hypertension     There are no problems to display for this patient.   Past Surgical History:  Procedure Laterality Date   NECK SURGERY         Home Medications    Prior to Admission medications   Not on File    Family History Family History  Family history unknown: Yes    Social History Social History   Tobacco Use   Smoking status: Some Days    Types: Cigarettes   Smokeless tobacco: Never     Allergies   Patient has no known allergies.   Review of Systems Review of Systems  Constitutional:  Positive for chills.  HENT:  Positive for sore throat and trouble swallowing.   Musculoskeletal:  Positive for neck pain.  All other systems reviewed and are negative.   Physical Exam Triage Vital Signs ED Triage Vitals  Enc Vitals Group     BP 02/06/21 1816 (!) 159/117     Pulse Rate 02/06/21 1816 74      Resp 02/06/21 1816 17     Temp 02/06/21 1816 98.4 F (36.9 C)     Temp Source 02/06/21 1816 Oral     SpO2 02/06/21 1816 98 %     Weight --      Height --      Head Circumference --      Peak Flow --      Pain Score 02/06/21 1815 9     Pain Loc --      Pain Edu? --      Excl. in GC? --    No data found.  Updated Vital Signs BP (!) 159/117 (BP Location: Right Arm)   Pulse 74   Temp 98.4 F (36.9 C) (Oral)   Resp 17   SpO2 98%   Visual Acuity Right Eye Distance:   Left Eye Distance:   Bilateral Distance:    Right Eye Near:   Left Eye Near:    Bilateral Near:     Physical Exam Vitals and nursing note reviewed.  Constitutional:      General: He is not in acute distress.    Appearance: Normal appearance. He is not ill-appearing, toxic-appearing or diaphoretic.  HENT:     Head: Normocephalic and atraumatic.     Mouth/Throat:  Pharynx: Uvula midline. Pharyngeal swelling and posterior oropharyngeal erythema present.     Tonsils: No tonsillar exudate or tonsillar abscesses. 2+ on the right. 2+ on the left.  Eyes:     Conjunctiva/sclera: Conjunctivae normal.  Cardiovascular:     Rate and Rhythm: Normal rate and regular rhythm.     Pulses: Normal pulses.     Heart sounds: Normal heart sounds.  Pulmonary:     Effort: Pulmonary effort is normal.     Breath sounds: Normal breath sounds.  Abdominal:     General: Abdomen is flat.  Musculoskeletal:        General: Normal range of motion.     Cervical back: Normal range of motion.  Skin:    General: Skin is warm and dry.  Neurological:     General: No focal deficit present.     Mental Status: He is alert and oriented to person, place, and time.  Psychiatric:        Mood and Affect: Mood normal.     UC Treatments / Results  Labs (all labs ordered are listed, but only abnormal results are displayed) Labs Reviewed  CULTURE, GROUP A STREP [facility])  POCT RAPID STREP A, ED / UC    EKG   Radiology No results  found.  Procedures Procedures (including critical care time)  Medications Ordered in UC Medications  ketorolac (TORADOL) 30 MG/ML injection 30 mg (has no administration in time range)    Initial Impression / Assessment and Plan / UC Course  I have reviewed the triage vital signs and the nursing notes.  Pertinent labs & imaging results that were available during my care of the patient were reviewed by me and considered in my medical decision making (see chart for details).    Assessment negative for red flags or concerns.  Rapid strep negative, throat culture pending.  This likely viral pharyngitis.  Neck pain is chronic related to surgery.  Patient will need to follow-up with surgeon or primary care provider for any specific pain medications.  Otherwise he may take Tylenol and or ibuprofen as needed.  Encourage fluids and rest.  Discussed conservative symptom management as described in discharge instructions.  Follow-up as needed. Final Clinical Impressions(s) / UC Diagnoses   Final diagnoses:  Viral pharyngitis  Neck pain     Discharge Instructions      You can take Tylenol and/or Ibuprofen as needed for fever reduction and pain relief.   For cough: honey 1/2 to 1 teaspoon (you can dilute the honey in water or another fluid).  You can also use guaifenesin and dextromethorphan for cough. You can use a humidifier for chest congestion and cough.  If you don't have a humidifier, you can sit in the bathroom with the hot shower running.     For sore throat: try warm salt water gargles, cepacol lozenges, throat spray, warm tea or water with lemon/honey, popsicles or ice, or OTC cold relief medicine for throat discomfort.    For congestion: take a daily anti-histamine like Zyrtec, Claritin, and a oral decongestant, such as pseudoephedrine.  You can also use Flonase 1-2 sprays in each nostril daily.    It is important to stay hydrated: drink plenty of fluids (water,  gatorade/powerade/pedialyte, juices, or teas) to keep your throat moisturized and help further relieve irritation/discomfort.   Return or go to the Emergency Department if symptoms worsen or do not improve in the next few days.      ED Prescriptions  None    PDMP not reviewed this encounter.   Ivette Loyal, NP 02/06/21 1907

## 2021-02-06 NOTE — ED Notes (Signed)
Pt reports that he out of HTN medications. Unsure what he was taking. Concerned about how high readings are here.

## 2021-02-08 LAB — CULTURE, GROUP A STREP (THRC)

## 2021-02-10 ENCOUNTER — Encounter (HOSPITAL_COMMUNITY): Payer: Self-pay | Admitting: Emergency Medicine

## 2021-06-30 ENCOUNTER — Telehealth (HOSPITAL_COMMUNITY): Payer: Self-pay | Admitting: Family Medicine

## 2021-06-30 ENCOUNTER — Encounter (HOSPITAL_COMMUNITY): Payer: Self-pay

## 2021-06-30 ENCOUNTER — Ambulatory Visit (HOSPITAL_COMMUNITY)
Admission: EM | Admit: 2021-06-30 | Discharge: 2021-06-30 | Disposition: A | Discharge status: Home / Self Care | Payer: Self-pay | Attending: Family Medicine | Admitting: Family Medicine

## 2021-06-30 DIAGNOSIS — I10 Essential (primary) hypertension: Secondary | ICD-10-CM

## 2021-06-30 DIAGNOSIS — K0889 Other specified disorders of teeth and supporting structures: Secondary | ICD-10-CM

## 2021-06-30 DIAGNOSIS — R369 Urethral discharge, unspecified: Secondary | ICD-10-CM

## 2021-06-30 MED ORDER — CHLORHEXIDINE GLUCONATE 0.12 % MT SOLN: 15.0000 mL | Freq: Two times a day (BID) | OROMUCOSAL | 0 refills | Status: DC

## 2021-06-30 MED ORDER — CEFTRIAXONE SODIUM 1 G IJ SOLR
INTRAMUSCULAR | Status: AC
  Filled 2021-06-30: qty 10

## 2021-06-30 MED ORDER — CHLORHEXIDINE GLUCONATE 0.12 % MT SOLN: 15.0000 mL | Freq: Two times a day (BID) | OROMUCOSAL | 0 refills | Status: AC

## 2021-06-30 MED ORDER — DOXYCYCLINE HYCLATE 100 MG PO CAPS: 100.0000 mg | ORAL_CAPSULE | Freq: Two times a day (BID) | ORAL | 0 refills | Status: DC

## 2021-06-30 MED ORDER — LIDOCAINE HCL (PF) 1 % IJ SOLN
INTRAMUSCULAR | Status: AC
  Filled 2021-06-30: qty 2

## 2021-06-30 MED ORDER — CEFTRIAXONE SODIUM 1 G IJ SOLR
1.0000 g | Freq: Once | INTRAMUSCULAR | Status: AC
  Administered 2021-06-30: 1 g via INTRAMUSCULAR

## 2021-06-30 MED ORDER — OXYCODONE-ACETAMINOPHEN 5-325 MG PO TABS: 1.0000 | ORAL_TABLET | Freq: Four times a day (QID) | ORAL | 0 refills | Status: DC | PRN

## 2021-06-30 NOTE — ED Triage Notes (Signed)
Pt presents today with penile discharge  over two weeks ago. Pain denies dysuria.  ? ?Patient states right tooth pain on upper and lower side of the mouth.  ?

## 2021-06-30 NOTE — Telephone Encounter (Signed)
Pt called requesting prescriptions from today resent to [facility] on [address].  ? ?Reporting request to provider.  ?

## 2021-06-30 NOTE — Discharge Instructions (Addendum)
Be aware, you have been prescribed pain medications that may cause drowsiness. While taking this medication, do not take any other medications containing acetaminophen (Tylenol). Do not combine with alcohol or other illicit drugs. Please do not drive, operate heavy machinery, or take part in activities that require making important decisions while on this medication as your judgement may be clouded. ? ?We have sent testing for sexually transmitted infections. We will notify you of any positive results once they are received. If required, we will prescribe any medications you might need. ? ?Please refrain from all sexual activity for at least the next seven days. ? ? ?

## 2021-06-30 NOTE — ED Provider Notes (Signed)
?[facility] ? ? ?[id number] ?06/30/21 Arrival Time: 1652 ? ?ASSESSMENT & PLAN: ? ?1. Pain, dental   ?2. Penile discharge   ?3. Elevated blood pressure reading in office with diagnosis of hypertension   ? ?No sign of abscess requiring I&D at this time. Discussed. ?Treating for concern for STI. Should help with possible tooth infection. Will have him complete doxy even if STI testing returns negative. Given 1gm Rocephin instead of 500mg . ? ?Meds ordered this encounter  ?Medications  ? cefTRIAXone (ROCEPHIN) injection 1 g  ? doxycycline (VIBRAMYCIN) 100 MG capsule  ?  Sig: Take 1 capsule (100 mg total) by mouth 2 (two) times daily.  ?  Dispense:  14 capsule  ?  Refill:  0  ? oxyCODONE-acetaminophen (PERCOCET/ROXICET) 5-325 MG tablet  ?  Sig: Take 1 tablet by mouth every 6 (six) hours as needed for severe pain.  ?  Dispense:  8 tablet  ?  Refill:  0  ? chlorhexidine (PERIDEX) 0.12 % solution  ?  Sig: Use as directed 15 mLs in the mouth or throat 2 (two) times daily.  ?  Dispense:  120 mL  ?  Refill:  0  ? ?[place] Controlled Substances Registry consulted for this patient. I feel the risk/benefit ratio today is favorable for proceeding with this prescription for a controlled substance. Medication sedation precautions given. ? ?Dental resource written instructions given. He will schedule dental evaluation as soon as possible if not improving over the next 24-48 hours. ? ?Reviewed expectations re: course of current medical issues. Questions answered. ?Outlined signs and symptoms indicating need for more acute intervention. ?Patient verbalized understanding. ?After Visit Summary given. ? ? ?SUBJECTIVE: ? ?Tim [name] is a 46 y.o. male who reports gradual onset of right lower dental pain described as sharp. Present for several days. Fever: absent. Tolerating PO intake but reports pain with chewing. Normal swallowing. He does not see a dentist regularly. No neck swelling or pain. OTC analgesics without  relief. ? ?Also worried re: penile discharge. Sexually active with one male partner but suspects she has STI. Penile discharge for sev days. No dysuria. Afebrile. No abd pain. ? ?Increased blood pressure noted today. Reports that he is treated for HTN. ? ?He reports no chest pain on exertion and no dyspnea on exertion. ? ? ?OBJECTIVE: ?Vitals:  ? 06/30/21 1731 06/30/21 1734  ?BP: (!) 167/101   ?Pulse: 92   ?Resp:  20  ?Temp: 98.6 ?F (37 ?C)   ?TempSrc: Oral   ?SpO2: 94%   ?  ?General appearance: alert; no distress ?HENT: normocephalic; atraumatic; dentition: poor; right lower gum without areas of fluctuance, drainage, or bleeding and with tenderness to palpation; normal jaw movement without difficulty; small ulcer on R side of tongue ?Neck: supple without LAD; FROM; trachea midline ?CV: reg ?Lungs: normal respirations; unlabored; speaks full sentences without difficulty ?GU: deferred ?Skin: warm and dry ?Psychological: alert and cooperative; normal mood and affect ? ?No Known Allergies ? ?Past Medical History:  ?Diagnosis Date  ? Asthma   ? Chronic combined systolic (congestive) and diastolic (congestive) heart failure (HCC)   ? Coronary artery disease   ? Hypertension   ? Noncompliance   ? Polysubstance abuse (HCC)   ? ?Social History  ? ?Socioeconomic History  ? Marital status: Single  ?  Spouse name: Not on file  ? Number of children: Not on file  ? Years of education: Not on file  ? Highest education level: Not on file  ?  Occupational History  ? Occupation: Administrator, [employer]  ?Tobacco Use  ? Smoking status: Some Days  ?  Types: Cigarettes  ? Smokeless tobacco: Never  ?Vaping Use  ? Vaping Use: Never used  ?Substance and Sexual Activity  ? Alcohol use: Yes  ?  Comment: occassionally, intermittent heavy drinking  ? Drug use: Not Currently  ?  Types: Cocaine, Marijuana  ?  Comment: "barely" uses marijuana, denies cocaine (prior UDS + cocaine and opiates  ? Sexual activity: Yes  ?Other Topics Concern  ? Not on file   ?Social History Narrative  ? ** Merged History Encounter **  ?    ? Previously incarcerated ?Single with multiple children (~8).   ?Off & on Smoker - 17 yrs ~1/2 - 1 PPD ?Currently unemployed - having a hard time finding a "good fit". ?He currently lives with his brother & is working on his GED. ?Admits to Cocaine (not C  ? rack) use.  ? ?Social Determinants of Health  ? ?Financial Resource Strain: Not on file  ?Food Insecurity: Not on file  ?Transportation Needs: Not on file  ?Physical Activity: Not on file  ?Stress: Not on file  ?Social Connections: Not on file  ?Intimate Partner Violence: Not on file  ? ?Family History  ?Problem Relation Age of Onset  ? Hypertension Mother   ? Alcoholism Father   ? ?Past Surgical History:  ?Procedure Laterality Date  ? ANTERIOR CERVICAL DECOMP/DISCECTOMY FUSION N/A 08/27/2019  ? Procedure: CERVICAL FOUR-FIVE ANTERIOR CERVICAL DECOMPRESSION/DISCECTOMY FUSION;  Surgeon: Jadene Pierini, MD;  Location: [facility] OR;  Service: Neurosurgery;  Laterality: N/A;  ? NECK SURGERY    ? TRANSTHORACIC ECHOCARDIOGRAM  09/23/2017  ? EF 20 to 25% with diffuse hypokinesis.  GR 1 DD.  Kinesis was more noted in the anterior and anterolateral wall  ? WISDOM TOOTH EXTRACTION    ? ? ?  ?Mardella Layman, MD ?06/30/21 1819 ? ?

## 2021-07-01 LAB — CYTOLOGY, (ORAL, ANAL, URETHRAL) ANCILLARY ONLY
Chlamydia: NEGATIVE
Comment: NEGATIVE
Comment: NEGATIVE
Comment: NORMAL
Neisseria Gonorrhea: NEGATIVE
Trichomonas: POSITIVE — AB

## 2021-07-02 ENCOUNTER — Telehealth (HOSPITAL_COMMUNITY): Payer: Self-pay | Admitting: Emergency Medicine

## 2021-07-02 MED ORDER — METRONIDAZOLE 500 MG PO TABS: 2000.0000 mg | ORAL_TABLET | Freq: Once | ORAL | 0 refills | Status: AC

## 2021-09-26 ENCOUNTER — Encounter (HOSPITAL_COMMUNITY): Payer: Self-pay

## 2021-09-26 ENCOUNTER — Ambulatory Visit (HOSPITAL_COMMUNITY)
Admission: EM | Admit: 2021-09-26 | Discharge: 2021-09-26 | Disposition: A | Discharge status: Home / Self Care | Payer: Self-pay | Attending: Nurse Practitioner | Admitting: Nurse Practitioner

## 2021-09-26 DIAGNOSIS — J3489 Other specified disorders of nose and nasal sinuses: Secondary | ICD-10-CM

## 2021-09-26 DIAGNOSIS — R030 Elevated blood-pressure reading, without diagnosis of hypertension: Secondary | ICD-10-CM

## 2021-09-26 DIAGNOSIS — K0889 Other specified disorders of teeth and supporting structures: Secondary | ICD-10-CM

## 2021-09-26 MED ORDER — AMOXICILLIN-POT CLAVULANATE 875-125 MG PO TABS: 1.0000 | ORAL_TABLET | Freq: Two times a day (BID) | ORAL | 0 refills | Status: AC

## 2021-09-26 MED ORDER — KETOROLAC TROMETHAMINE 30 MG/ML IJ SOLN: 30.0000 mg | Freq: Once | INTRAMUSCULAR | Status: DC

## 2021-09-26 MED ORDER — AMLODIPINE BESYLATE 5 MG PO TABS: 5.0000 mg | ORAL_TABLET | Freq: Every day | ORAL | 0 refills | Status: DC

## 2021-09-26 MED ORDER — KETOROLAC TROMETHAMINE 30 MG/ML IJ SOLN
INTRAMUSCULAR | Status: AC
  Filled 2021-09-26: qty 1

## 2021-09-26 NOTE — ED Triage Notes (Signed)
Patient presents to Urgent Care with complaints of dental pain on upper right side since 6 months ago but pain increasing last week or 2. Patient reports his wisdom tooth on right upper side is cracked and he has been experiencing pain.   Pt also sinus issues and pain in nares. Pt has been using otc nasal spray. Pt st this started 1 week ago. Pt reports using antibacterial cream in nose to help with dryness

## 2021-09-26 NOTE — ED Provider Notes (Addendum)
[facility]    CSN: [id number] Arrival date & time: 09/26/21  1420      History   Chief Complaint Chief Complaint  Patient presents with   Dental Pain   Facial Pain    HPI Tim [name] is a 46 y.o. male.   Patient presents with 2 concern today.  First, he is concerned about a tooth that broke off in his upper jaw a few days ago.  Reports it is extremely painful, swollen, and the entire right side of his face hurts.  He denies drainage inside his mouth, fevers, nausea/vomiting.  He is aware that he has other teeth that need work in his mouth, however they are not bothering him today.  Secondly, he is complaining of a sinus infection.  Reports he has had a lot of sinus pain and pressure, nasal congestion and drainage, and headache.  He denies chest pain, chest tightness, shortness of breath, wheezing, abdominal pain, new rash.  Denies sore throat.  He has not been taking anything for his symptoms.  He denies any vision changes, swelling in his legs.    Past Medical History:  Diagnosis Date   Asthma    Chronic combined systolic (congestive) and diastolic (congestive) heart failure (HCC)    Coronary artery disease    Hypertension    Noncompliance    Polysubstance abuse [facility])     Patient Active Problem List   Diagnosis Date Noted   Cervical stenosis of spine    Cord compression (HCC)    Leukopenia    Central cord syndrome (HCC) 08/26/2019   Chronic combined systolic (congestive) and diastolic (congestive) heart failure (HCC) 08/26/2019   Obesity (BMI 30.0-34.9) 08/26/2019   Tobacco dependence 08/26/2019   Dilated cardiomyopathy (HCC) 10/04/2017   Polysubstance abuse (HCC) 10/04/2017   OD (overdose of drug) 09/22/2017   Elevated troponin 09/22/2017   Essential hypertension     Past Surgical History:  Procedure Laterality Date   ANTERIOR CERVICAL DECOMP/DISCECTOMY FUSION N/A 08/27/2019   Procedure: CERVICAL FOUR-FIVE ANTERIOR CERVICAL  DECOMPRESSION/DISCECTOMY FUSION;  Surgeon: Jadene Pierini, MD;  Location: [facility] OR;  Service: Neurosurgery;  Laterality: N/A;   NECK SURGERY     TRANSTHORACIC ECHOCARDIOGRAM  09/23/2017   EF 20 to 25% with diffuse hypokinesis.  GR 1 DD.  Kinesis was more noted in the anterior and anterolateral wall   WISDOM TOOTH EXTRACTION         Home Medications    Prior to Admission medications   Medication Sig Start Date End Date Taking? Authorizing Provider  amoxicillin-clavulanate (AUGMENTIN) 875-125 MG tablet Take 1 tablet by mouth every 12 (twelve) hours for 7 days. 09/26/21 10/03/21 Yes Cathlean Marseilles A, NP  amLODipine (NORVASC) 5 MG tablet Take 1 tablet (5 mg total) by mouth daily. 09/26/21 10/26/21  Valentino Nose, NP  chlorhexidine (PERIDEX) 0.12 % solution Use as directed 15 mLs in the mouth or throat 2 (two) times daily. 06/30/21   Mardella Layman, MD  cyclobenzaprine (FLEXERIL) 10 MG tablet Take 1 tablet (10 mg total) by mouth 3 (three) times daily as needed for muscle spasms. 08/29/19   Jadene Pierini, MD  gabapentin (NEURONTIN) 300 MG capsule Take 1 capsule (300 mg total) by mouth 3 (three) times daily. 08/29/19   Jadene Pierini, MD  lidocaine (XYLOCAINE) 2 % solution Use as directed 15 mLs in the mouth or throat as needed for mouth pain. 11/01/20   Ivette Loyal, NP  lisinopril (ZESTRIL) 10 MG  tablet Take 1 tablet (10 mg total) by mouth daily. 08/30/19   Jadene Pierini, MD    Family History Family History  Problem Relation Age of Onset   Hypertension Mother    Alcoholism Father     Social History Social History   Tobacco Use   Smoking status: Some Days    Types: Cigarettes   Smokeless tobacco: Never  Vaping Use   Vaping Use: Never used  Substance Use Topics   Alcohol use: Yes    Comment: occassionally, intermittent heavy drinking   Drug use: Not Currently    Types: Cocaine, Marijuana    Comment: "barely" uses marijuana, denies cocaine (prior UDS + cocaine  and opiates     Allergies   Patient has no known allergies.   Review of Systems Review of Systems Per HPI  Physical Exam Triage Vital Signs ED Triage Vitals  Enc Vitals Group     BP 09/26/21 1458 (!) 166/113     Pulse Rate 09/26/21 1458 (!) 106     Resp 09/26/21 1458 18     Temp 09/26/21 1458 98.7 F (37.1 C)     Temp Source 09/26/21 1458 Oral     SpO2 09/26/21 1458 97 %     Weight --      Height --      Head Circumference --      Peak Flow --      Pain Score 09/26/21 1457 10     Pain Loc --      Pain Edu? --      Excl. in GC? --    No data found.  Updated Vital Signs BP (!) 166/113   Pulse (!) 106   Temp 98.7 F (37.1 C) (Oral)   Resp 18   SpO2 97%   Visual Acuity Right Eye Distance:   Left Eye Distance:   Bilateral Distance:    Right Eye Near:   Left Eye Near:    Bilateral Near:     Physical Exam Vitals and nursing note reviewed.  Constitutional:      General: He is not in acute distress.    Appearance: Normal appearance. He is not ill-appearing or toxic-appearing.  HENT:     Head: Normocephalic and atraumatic.     Right Ear: Tympanic membrane, ear canal and external ear normal.     Left Ear: Tympanic membrane, ear canal and external ear normal.     Nose: No congestion or rhinorrhea.     Right Sinus: Maxillary sinus tenderness and frontal sinus tenderness present.     Left Sinus: No maxillary sinus tenderness or frontal sinus tenderness.     Mouth/Throat:     Mouth: Mucous membranes are moist.     Dentition: Abnormal dentition. Dental tenderness and dental caries present.     Pharynx: Oropharynx is clear. Posterior oropharyngeal erythema present. No oropharyngeal exudate.     Tonsils: No tonsillar exudate or tonsillar abscesses.   Eyes:     General: No scleral icterus.    Extraocular Movements: Extraocular movements intact.  Cardiovascular:     Rate and Rhythm: Regular rhythm. Tachycardia present.  Pulmonary:     Effort: Pulmonary effort is  normal. No respiratory distress.     Breath sounds: Normal breath sounds. No wheezing, rhonchi or rales.  Musculoskeletal:     Cervical back: Normal range of motion and neck supple.  Lymphadenopathy:     Cervical: No cervical adenopathy.  Skin:    General: Skin is  warm and dry.     Coloration: Skin is not jaundiced or pale.     Findings: No erythema or rash.  Neurological:     Mental Status: He is alert and oriented to person, place, and time.     Motor: No weakness.  Psychiatric:        Behavior: Behavior is cooperative.      UC Treatments / Results  Labs (all labs ordered are listed, but only abnormal results are displayed) Labs Reviewed - No data to display  EKG   Radiology No results found.  Procedures Procedures (including critical care time)  Medications Ordered in UC Medications  ketorolac (TORADOL) 30 MG/ML injection 30 mg (has no administration in time range)    Initial Impression / Assessment and Plan / UC Course  I have reviewed the triage vital signs and the nursing notes.  Pertinent labs & imaging results that were available during my care of the patient were reviewed by me and considered in my medical decision making (see chart for details).    46 year old male presenting with dental pain, possible sinus infection.  We will treat both with Augmentin twice daily for 7 days.  Low cost dental resources given.  Toradol 30 mg IM injection given today in urgent care for apin.  Additionally, patient is hypertensive.  This could be secondary to pain, although he does have a history of combined systolic and diastolic heart failure.  Resume amlodipine 5 mg daily, assistance for primary care placed and I encouraged close follow-up given history.  Final Clinical Impressions(s) / UC Diagnoses   Final diagnoses:  Pain, dental  Sinus pain  Elevated blood pressure reading in office without diagnosis of hypertension     Discharge Instructions      - Please  start the Augmentin for both the tooth infection and sinus infection; this medicine should clear up both - You can use cold compresses and Tylenol 500 mg every 6 hours as needed for pain - Please follow up with a Dentist for further evaluation and treatment of your teeth - Please start the amlodipine 5 mg daily for blood pressure and we will assist with getting you a primary care provider     ED Prescriptions     Medication Sig Dispense Auth. Provider   amLODipine (NORVASC) 5 MG tablet Take 1 tablet (5 mg total) by mouth daily. 30 tablet Cathlean Marseilles A, NP   amoxicillin-clavulanate (AUGMENTIN) 875-125 MG tablet Take 1 tablet by mouth every 12 (twelve) hours for 7 days. 14 tablet Valentino Nose, NP      I have reviewed the PDMP during this encounter.   Valentino Nose, NP 09/26/21 1623    Valentino Nose, NP 09/26/21 1624

## 2021-09-26 NOTE — Discharge Instructions (Addendum)
-   Please start the Augmentin for both the tooth infection and sinus infection; this medicine should clear up both - You can use cold compresses and Tylenol 500 mg every 6 hours as needed for pain - Please follow up with a Dentist for further evaluation and treatment of your teeth - Please start the amlodipine 5 mg daily for blood pressure and we will assist with getting you a primary care provider

## 2021-10-24 ENCOUNTER — Other Ambulatory Visit: Payer: Self-pay | Admitting: Nurse Practitioner

## 2021-10-26 NOTE — Telephone Encounter (Signed)
rx from ED provider. No PCP to send this to. Requested Prescriptions  Pending Prescriptions Disp Refills  . amLODipine (NORVASC) 5 MG tablet [Pharmacy Med Name: AMLODIPINE BESYLATE 5MG  TABLETS] 30 tablet 0    Sig: TAKE 1 TABLET(5 MG) BY MOUTH DAILY     Cardiovascular: Calcium Channel Blockers 2 Failed - 10/24/2021  8:56 AM      Failed - Last BP in normal range    BP Readings from Last 1 Encounters:  09/26/21 (!) 166/113         Failed - Valid encounter within last 6 months    Recent Outpatient Visits   None            Passed - Last Heart Rate in normal range    Pulse Readings from Last 1 Encounters:  09/26/21 (!) 106

## 2022-04-11 ENCOUNTER — Ambulatory Visit (HOSPITAL_COMMUNITY)
Admission: EM | Admit: 2022-04-11 | Discharge: 2022-04-11 | Disposition: A | Discharge status: Home / Self Care | Payer: Medicaid Other | Attending: Internal Medicine | Admitting: Internal Medicine

## 2022-04-11 ENCOUNTER — Encounter (HOSPITAL_COMMUNITY): Payer: Self-pay

## 2022-04-11 DIAGNOSIS — I10 Essential (primary) hypertension: Secondary | ICD-10-CM | POA: Insufficient documentation

## 2022-04-11 DIAGNOSIS — R369 Urethral discharge, unspecified: Secondary | ICD-10-CM | POA: Diagnosis present

## 2022-04-11 DIAGNOSIS — Z202 Contact with and (suspected) exposure to infections with a predominantly sexual mode of transmission: Secondary | ICD-10-CM | POA: Insufficient documentation

## 2022-04-11 MED ORDER — CEFTRIAXONE SODIUM 1 G IJ SOLR
1.0000 g | Freq: Once | INTRAMUSCULAR | Status: AC
  Administered 2022-04-11: 1 g via INTRAMUSCULAR

## 2022-04-11 MED ORDER — CEFTRIAXONE SODIUM 1 G IJ SOLR
INTRAMUSCULAR | Status: AC
  Filled 2022-04-11: qty 10

## 2022-04-11 MED ORDER — AMLODIPINE BESYLATE 5 MG PO TABS: 5.0000 mg | ORAL_TABLET | Freq: Every day | ORAL | 0 refills | Status: AC

## 2022-04-11 MED ORDER — ACETAMINOPHEN 325 MG PO TABS
975.0000 mg | ORAL_TABLET | Freq: Once | ORAL | Status: AC
  Administered 2022-04-11: 975 mg via ORAL

## 2022-04-11 MED ORDER — LIDOCAINE HCL (PF) 1 % IJ SOLN
INTRAMUSCULAR | Status: AC
  Filled 2022-04-11: qty 4

## 2022-04-11 MED ORDER — ACETAMINOPHEN 325 MG PO TABS
ORAL_TABLET | ORAL | Status: AC
  Filled 2022-04-11: qty 3

## 2022-04-11 MED ORDER — KETOROLAC TROMETHAMINE 30 MG/ML IJ SOLN
INTRAMUSCULAR | Status: AC
  Filled 2022-04-11: qty 1

## 2022-04-11 MED ORDER — KETOROLAC TROMETHAMINE 30 MG/ML IJ SOLN
15.0000 mg | Freq: Once | INTRAMUSCULAR | Status: AC
  Administered 2022-04-11: 15 mg via INTRAMUSCULAR

## 2022-04-11 NOTE — ED Provider Notes (Signed)
[facility]    CSN: [id number] Arrival date & time: 04/11/22  1125      History   Chief Complaint Chief Complaint  Patient presents with   Penile Discharge    HPI Tim [name] is a 47 y.o. male.   Patient presents urgent care for evaluation of clear penile discharge that started a couple of days ago with associated bilateral groin swelling and penile/testicular pain.  Symptoms began with penile discharge, then progressed to generalized pain of the genitourinary area.  He denies rash, penile itching, urinary frequency, urinary hesitancy, and urinary urgency.  Reports dysuria that started around the same time as penile discharge.  He is sexually active without condoms with male partner.  Reports recent new sexual partner as well, no known exposure to STD to his knowledge.  Male partner is experiencing symptoms as well.  Denies recent antibiotic or steroid use.  No fever, chills, nausea, vomiting, abdominal pain, or constipation/diarrhea.  Patient would also like for his amlodipine to be refilled today.  He does not have a primary care provider and does not have insurance.  Blood pressure is elevated at 171/101 in clinic, he denies headache, dizziness, and chest pain/shortness of breath.  States he has been out of his medicine for "a while".   Penile Discharge    Past Medical History:  Diagnosis Date   Asthma    Chronic combined systolic (congestive) and diastolic (congestive) heart failure (HCC)    Coronary artery disease    Hypertension    Noncompliance    Polysubstance abuse [facility])     Patient Active Problem List   Diagnosis Date Noted   Cervical stenosis of spine    Cord compression (HCC)    Leukopenia    Central cord syndrome (HCC) 08/26/2019   Chronic combined systolic (congestive) and diastolic (congestive) heart failure (HCC) 08/26/2019   Obesity (BMI 30.0-34.9) 08/26/2019   Tobacco dependence 08/26/2019   Dilated cardiomyopathy (HCC) 10/04/2017    Polysubstance abuse (HCC) 10/04/2017   OD (overdose of drug) 09/22/2017   Elevated troponin 09/22/2017   Essential hypertension     Past Surgical History:  Procedure Laterality Date   ANTERIOR CERVICAL DECOMP/DISCECTOMY FUSION N/A 08/27/2019   Procedure: CERVICAL FOUR-FIVE ANTERIOR CERVICAL DECOMPRESSION/DISCECTOMY FUSION;  Surgeon: Jadene Pierini, MD;  Location: [facility] OR;  Service: Neurosurgery;  Laterality: N/A;   NECK SURGERY     TRANSTHORACIC ECHOCARDIOGRAM  09/23/2017   EF 20 to 25% with diffuse hypokinesis.  GR 1 DD.  Kinesis was more noted in the anterior and anterolateral wall   WISDOM TOOTH EXTRACTION         Home Medications    Prior to Admission medications   Medication Sig Start Date End Date Taking? Authorizing Provider  amLODipine (NORVASC) 5 MG tablet Take 1 tablet (5 mg total) by mouth daily. 04/11/22 05/11/22  Carlisle Beers, FNP  chlorhexidine (PERIDEX) 0.12 % solution Use as directed 15 mLs in the mouth or throat 2 (two) times daily. 06/30/21   Mardella Layman, MD  cyclobenzaprine (FLEXERIL) 10 MG tablet Take 1 tablet (10 mg total) by mouth 3 (three) times daily as needed for muscle spasms. 08/29/19   Jadene Pierini, MD  gabapentin (NEURONTIN) 300 MG capsule Take 1 capsule (300 mg total) by mouth 3 (three) times daily. 08/29/19   Jadene Pierini, MD  lidocaine (XYLOCAINE) 2 % solution Use as directed 15 mLs in the mouth or throat as needed for mouth pain. 11/01/20  Pearson Forster, NP  lisinopril (ZESTRIL) 10 MG tablet Take 1 tablet (10 mg total) by mouth daily. 08/30/19   Judith Part, MD    Family History Family History  Problem Relation Age of Onset   Hypertension Mother    Alcoholism Father     Social History Social History   Tobacco Use   Smoking status: Some Days    Types: Cigarettes   Smokeless tobacco: Never  Vaping Use   Vaping Use: Never used  Substance Use Topics   Alcohol use: Yes    Comment: occassionally, intermittent  heavy drinking   Drug use: Not Currently    Types: Cocaine, Marijuana    Comment: "barely" uses marijuana, denies cocaine (prior UDS + cocaine and opiates     Allergies   Patient has no known allergies.   Review of Systems Review of Systems  Genitourinary:  Positive for penile discharge.  Per HPI   Physical Exam Triage Vital Signs ED Triage Vitals [04/11/22 1154]  Enc Vitals Group     BP (!) 171/101     Pulse Rate 77     Resp 16     Temp 98 F (36.7 C)     Temp Source Oral     SpO2 98 %     Weight      Height      Head Circumference      Peak Flow      Pain Score      Pain Loc      Pain Edu?      Excl. in [place]?    No data found.  Updated Vital Signs BP (!) 171/101 (BP Location: Left Arm)   Pulse 77   Temp 98 F (36.7 C) (Oral)   Resp 16   SpO2 98%   Visual Acuity Right Eye Distance:   Left Eye Distance:   Bilateral Distance:    Right Eye Near:   Left Eye Near:    Bilateral Near:     Physical Exam Vitals and nursing note reviewed.  Constitutional:      Appearance: He is not ill-appearing or toxic-appearing.  HENT:     Head: Normocephalic and atraumatic.     Right Ear: Hearing and external ear normal.     Left Ear: Hearing and external ear normal.     Nose: Nose normal.     Mouth/Throat:     Lips: Pink.  Eyes:     General: Lids are normal. Vision grossly intact. Gaze aligned appropriately.     Extraocular Movements: Extraocular movements intact.     Conjunctiva/sclera: Conjunctivae normal.  Cardiovascular:     Rate and Rhythm: Normal rate and regular rhythm.     Heart sounds: Normal heart sounds, S1 normal and S2 normal.  Pulmonary:     Effort: Pulmonary effort is normal. No respiratory distress.     Breath sounds: Normal breath sounds and air entry.  Genitourinary:    Testes: Normal.     Comments: Slight erythema to the glans penis without evidence of ulceration or drainage.  No obvious rash or swelling to the bilateral inguinal areas.  No  inguinal lymphadenopathy. Musculoskeletal:     Cervical back: Neck supple.  Skin:    General: Skin is warm and dry.     Capillary Refill: Capillary refill takes less than 2 seconds.     Findings: No rash.  Neurological:     General: No focal deficit present.     Mental Status:  He is alert and oriented to person, place, and time. Mental status is at baseline.     Cranial Nerves: No dysarthria or facial asymmetry.  Psychiatric:        Mood and Affect: Mood normal.        Speech: Speech normal.        Behavior: Behavior normal.        Thought Content: Thought content normal.        Judgment: Judgment normal.      UC Treatments / Results  Labs (all labs ordered are listed, but only abnormal results are displayed) Labs Reviewed  CYTOLOGY, (ORAL, ANAL, URETHRAL) ANCILLARY ONLY    EKG   Radiology No results found.  Procedures Procedures (including critical care time)  Medications Ordered in UC Medications  cefTRIAXone (ROCEPHIN) injection 1 g (has no administration in time range)  acetaminophen (TYLENOL) tablet 975 mg (has no administration in time range)  ketorolac (TORADOL) 30 MG/ML injection 15 mg (has no administration in time range)    Initial Impression / Assessment and Plan / UC Course  I have reviewed the triage vital signs and the nursing notes.  Pertinent labs & imaging results that were available during my care of the patient were reviewed by me and considered in my medical decision making (see chart for details).   1.  Possible exposure to STD, penile discharge We will go ahead and treat for possible gonorrhea today due to dysuria and penile discharge.  We will treat for all other STDs when testing comes back in the next 2 to 3 days.  Advised patient to avoid sexual intercourse for 7 days while being treated for STD.  Discussed condom use.  Patient requesting "shot for pain" in clinic today due to significant groin and penile discomfort.  Patient given ketorolac  15 mg IM for pain and has been advised to not use NSAIDs until tomorrow due to ketorolac injection in clinic.  2.  Essential hypertension Amlodipine 5 mg restarted and sent to pharmacy.  He is to take this once daily.  Discussed DASH diet and limiting salt intake/increasing exercise.  Advised patient to call [facility] to establish care as a except sliding-scale payment due to lack of health insurance.  No signs of endorgan damage requiring immediate referral to ED.  He is agreeable with this plan.  Discussed physical exam and available lab work findings in clinic with patient.  Counseled patient regarding appropriate use of medications and potential side effects for all medications recommended or prescribed today. Discussed red flag signs and symptoms of worsening condition,when to call the PCP office, return to urgent care, and when to seek higher level of care in the emergency department. Patient verbalizes understanding and agreement with plan. All questions answered. Patient discharged in stable condition.    Final Clinical Impressions(s) / UC Diagnoses   Final diagnoses:  Possible exposure to STD  Penile discharge  Essential hypertension     Discharge Instructions      Your STD testing has been sent to the lab and will come back in the next 2 to 3 days.  We will call you if any of your results are positive requiring treatment and treat you at that time.  You are not allowed to have sexual intercourse for 7 days while you are being treated for STD. I gave you an injection of Rocephin in the clinic to treat possible gonorrhea infection. We will treat you for any  other STD that you test positive for once your testing comes back in the next 2 to 3 days.  I also gave you a shot of Toradol in the clinic today for your testicular and penile pain. Do not take any ibuprofen, Advil, naproxen, or Aleve until tomorrow due to the Toradol shot that I gave you in  the clinic.  Take amlodipine 10 mg once daily to help reduce your blood pressure. I strongly encourage you to find a primary care provider in the area who will take sliding scale payments due to your lack of insurance. [facility] is 1 of these places.  I have listed their phone number and their address on your paperwork so that you are able to's call and schedule an appointment for follow-up regarding your elevated blood pressure. Limit your salt intake to less than 1 g/day and begin exercising to reduce your blood pressure naturally.  If you develop any new or worsening symptoms or do not improve in the next 2 to 3 days, please return.  If your symptoms are severe, please go to the emergency room.  Follow-up with your primary care provider for further evaluation and management of your symptoms as well as ongoing wellness visits.  I hope you feel better!     ED Prescriptions     Medication Sig Dispense Auth. Provider   amLODipine (NORVASC) 5 MG tablet Take 1 tablet (5 mg total) by mouth daily. 30 tablet Talbot Grumbling, FNP      PDMP not reviewed this encounter.   Talbot Grumbling, [place] 04/11/22 1224

## 2022-04-11 NOTE — Discharge Instructions (Signed)
Your STD testing has been sent to the lab and will come back in the next 2 to 3 days.  We will call you if any of your results are positive requiring treatment and treat you at that time.  You are not allowed to have sexual intercourse for 7 days while you are being treated for STD. I gave you an injection of Rocephin in the clinic to treat possible gonorrhea infection. We will treat you for any other STD that you test positive for once your testing comes back in the next 2 to 3 days.  I also gave you a shot of Toradol in the clinic today for your testicular and penile pain. Do not take any ibuprofen, Advil, naproxen, or Aleve until tomorrow due to the Toradol shot that I gave you in the clinic.  Take amlodipine 10 mg once daily to help reduce your blood pressure. I strongly encourage you to find a primary care provider in the area who will take sliding scale payments due to your lack of insurance. [facility] is 1 of these places.  I have listed their phone number and their address on your paperwork so that you are able to's call and schedule an appointment for follow-up regarding your elevated blood pressure. Limit your salt intake to less than 1 g/day and begin exercising to reduce your blood pressure naturally.  If you develop any new or worsening symptoms or do not improve in the next 2 to 3 days, please return.  If your symptoms are severe, please go to the emergency room.  Follow-up with your primary care provider for further evaluation and management of your symptoms as well as ongoing wellness visits.  I hope you feel better!

## 2022-04-11 NOTE — ED Triage Notes (Signed)
Pt stated he would like to be treated today. Pt is not sure what he has been expose to at this time. Pt c/o groin pain and swelling. Pt c/o penile discharge.

## 2022-04-12 LAB — CYTOLOGY, (ORAL, ANAL, URETHRAL) ANCILLARY ONLY
Chlamydia: NEGATIVE
Comment: NEGATIVE
Comment: NEGATIVE
Comment: NORMAL
Neisseria Gonorrhea: NEGATIVE
Trichomonas: NEGATIVE

## 2022-05-06 ENCOUNTER — Ambulatory Visit (HOSPITAL_COMMUNITY): Admission: EM | Admit: 2022-05-06 | Discharge: 2022-05-06 | Discharge status: Against Medical Advice | Payer: Self-pay

## 2022-05-06 NOTE — ED Triage Notes (Signed)
Informed by front desk staff that patient has left. Patient left without being seen.

## 2022-06-30 ENCOUNTER — Other Ambulatory Visit: Payer: Self-pay

## 2022-06-30 ENCOUNTER — Emergency Department (HOSPITAL_COMMUNITY)
Admission: EM | Admit: 2022-06-30 | Discharge: 2022-06-30 | Disposition: A | Discharge status: Home / Self Care | Payer: Commercial Managed Care - HMO | Attending: Emergency Medicine | Admitting: Emergency Medicine

## 2022-06-30 ENCOUNTER — Encounter (HOSPITAL_COMMUNITY): Payer: Self-pay | Admitting: *Deleted

## 2022-06-30 ENCOUNTER — Emergency Department (HOSPITAL_COMMUNITY): Payer: Commercial Managed Care - HMO

## 2022-06-30 DIAGNOSIS — R079 Chest pain, unspecified: Secondary | ICD-10-CM | POA: Diagnosis present

## 2022-06-30 DIAGNOSIS — S0031XA Abrasion of nose, initial encounter: Secondary | ICD-10-CM | POA: Insufficient documentation

## 2022-06-30 DIAGNOSIS — R0781 Pleurodynia: Secondary | ICD-10-CM | POA: Diagnosis not present

## 2022-06-30 DIAGNOSIS — W01198A Fall on same level from slipping, tripping and stumbling with subsequent striking against other object, initial encounter: Secondary | ICD-10-CM | POA: Diagnosis not present

## 2022-06-30 DIAGNOSIS — S60511A Abrasion of right hand, initial encounter: Secondary | ICD-10-CM | POA: Insufficient documentation

## 2022-06-30 MED ORDER — NAPROXEN 500 MG PO TABS: 500.0000 mg | ORAL_TABLET | Freq: Two times a day (BID) | ORAL | 0 refills | Status: AC

## 2022-06-30 MED ORDER — NAPROXEN 250 MG PO TABS
500.0000 mg | ORAL_TABLET | Freq: Once | ORAL | Status: AC
  Administered 2022-06-30: 500 mg via ORAL
  Filled 2022-06-30: qty 2

## 2022-06-30 NOTE — Discharge Instructions (Signed)
Take the prescribed medication as directed.  Can take tylenol with this if needed. Follow-up with your primary care doctor. Return to the ED for new or worsening symptoms.

## 2022-06-30 NOTE — ED Provider Notes (Signed)
[facility] Provider Note   CSN: [id number] Arrival date & time: 06/30/22  0407     History  Chief Complaint  Patient presents with   Fall        Chest Pain    Tim [name] is a 47 y.o. male.  The history is provided by the patient and medical records.  Fall Associated symptoms include chest pain.  Chest Pain  47 y.o. M presenting to the ED with right rib pain after he fell and struck his ribs on the corner of the porch.  Sustained minor abrasion to the nose and right hand as well, no LOC.  States only has pain in right ribs.  Pain worse with movement, deep breathing, etc.  No intervention tried PTA.  He is not on anticoagulation.    Home Medications Prior to Admission medications   Medication Sig Start Date End Date Taking? Authorizing Provider  naproxen (NAPROSYN) 500 MG tablet Take 1 tablet (500 mg total) by mouth 2 (two) times daily. 06/30/22  Yes Larene Pickett, PA-C  amLODipine (NORVASC) 5 MG tablet Take 1 tablet (5 mg total) by mouth daily. 04/11/22 05/11/22  Talbot Grumbling, FNP  chlorhexidine (PERIDEX) 0.12 % solution Use as directed 15 mLs in the mouth or throat 2 (two) times daily. 06/30/21   Vanessa Kick, MD  cyclobenzaprine (FLEXERIL) 10 MG tablet Take 1 tablet (10 mg total) by mouth 3 (three) times daily as needed for muscle spasms. 08/29/19   Judith Part, MD  gabapentin (NEURONTIN) 300 MG capsule Take 1 capsule (300 mg total) by mouth 3 (three) times daily. 08/29/19   Judith Part, MD  lidocaine (XYLOCAINE) 2 % solution Use as directed 15 mLs in the mouth or throat as needed for mouth pain. 11/01/20   Pearson Forster, NP  lisinopril (ZESTRIL) 10 MG tablet Take 1 tablet (10 mg total) by mouth daily. 08/30/19   Judith Part, MD      Allergies    Patient has no known allergies.    Review of Systems   Review of Systems  Cardiovascular:  Positive for chest pain.  All other systems reviewed  and are negative.   Physical Exam Updated Vital Signs BP (!) 157/96 (BP Location: Left Arm)   Pulse 94   Temp 97.6 F (36.4 C) (Oral)   Resp (!) 21   Ht 6' (1.829 m)   Wt 108.9 kg   SpO2 100%   BMI 32.55 kg/m   Physical Exam Vitals and nursing note reviewed.  Constitutional:      Appearance: He is well-developed.     Comments: Curled up into fetal position, lying on right side  HENT:     Head: Normocephalic and atraumatic.     Nose:     Comments: Minor abrasion across bridge of nose, no acute deformity, no epistaxis Eyes:     Conjunctiva/sclera: Conjunctivae normal.     Pupils: Pupils are equal, round, and reactive to light.  Cardiovascular:     Rate and Rhythm: Normal rate and regular rhythm.     Heart sounds: Normal heart sounds.  Pulmonary:     Effort: Pulmonary effort is normal.     Breath sounds: Normal breath sounds.  Chest:       Comments: Tenderness of right anterior ribs just below the pectoralis as depicted, no bruising, open wound, or acute deformity Abdominal:     General: Bowel sounds are normal.  Palpations: Abdomen is soft.  Musculoskeletal:        General: Normal range of motion.     Cervical back: Normal range of motion.     Comments: Minor abrasion dorsal right hand, no bleeding, no swelling or deformity  Skin:    General: Skin is warm and dry.  Neurological:     Mental Status: He is alert and oriented to person, place, and time.     ED Results / Procedures / Treatments   Labs (all labs ordered are listed, but only abnormal results are displayed) Labs Reviewed - No data to display  EKG None  Radiology DG Ribs Unilateral W/Chest Right  Result Date: 06/30/2022 CLINICAL DATA:  Right ribcage trauma.  Fall injury. EXAM: RIGHT RIBS AND CHEST - 3+ VIEW COMPARISON:  Portable chest 08/26/2019 FINDINGS: No displaced fracture or other bone lesions are seen involving the ribs. There is no evidence of pneumothorax or pleural effusion. Both lungs  are clear aside from a few linear atelectatic foci in the bases. Heart size and mediastinal contours are within normal limits. IMPRESSION: 1. No displaced rib fractures. 2. Minimal bibasilar atelectasis. Electronically Signed   By: Telford Nab M.D.   On: 06/30/2022 05:32    Procedures Procedures    Medications Ordered in ED Medications  naproxen (NAPROSYN) tablet 500 mg (500 mg Oral Given 06/30/22 0556)    ED Course/ Medical Decision Making/ A&P                             Medical Decision Making Amount and/or Complexity of Data Reviewed Radiology: ordered and independent interpretation performed.  Risk Prescription drug management.   47 y.o. M here with right rib pain after striking it against corner of the porch.  Also scraped his nose and right hand, no LOC.  He is AAOx3 here.  Denies pain aside from his ribs.  Does have minor abrasion across bridge of nose without acute deformity or swelling. No epistaxis.  Minor abrasion right hand, no swelling or bony deformity, normal ROM.  Rib films obtained, no acute findings.  Suspect likely contusions.  He has no hypoxia or increased WOB.  Will plan to d/c with symptomatic control, incentive spirometer.  Can follow-up with PCP.  Return here for new concerns.  Final Clinical Impression(s) / ED Diagnoses Final diagnoses:  Rib pain    Rx / DC Orders ED Discharge Orders          Ordered    naproxen (NAPROSYN) 500 MG tablet  2 times daily        06/30/22 0551              Larene Pickett, PA-C 06/30/22 [id number]    Quintella Reichert, MD 06/30/22 [phone]

## 2022-06-30 NOTE — ED Triage Notes (Signed)
Patient reports he fell on the edge of the porch at 2100.  He is having pain in the right side of his chest that is worse with movement and deep breathing.  He has abrasion to the bridge of his nose and to the right hand.  Patient denies loc.  He states he did have oral surgery today.  No pain meds prior to arrival.

## 2022-08-13 ENCOUNTER — Ambulatory Visit
Admission: EM | Admit: 2022-08-13 | Discharge: 2022-08-13 | Disposition: A | Discharge status: Home / Self Care | Payer: Commercial Managed Care - HMO

## 2022-08-13 DIAGNOSIS — M542 Cervicalgia: Secondary | ICD-10-CM | POA: Diagnosis not present

## 2022-08-13 DIAGNOSIS — R030 Elevated blood-pressure reading, without diagnosis of hypertension: Secondary | ICD-10-CM

## 2022-08-13 DIAGNOSIS — G8929 Other chronic pain: Secondary | ICD-10-CM | POA: Diagnosis not present

## 2022-08-13 MED ORDER — KETOROLAC TROMETHAMINE 30 MG/ML IJ SOLN: 30.0000 mg | Freq: Once | INTRAMUSCULAR | Status: DC

## 2022-08-13 MED ORDER — KETOROLAC TROMETHAMINE 15 MG/ML IJ SOLN
15.0000 mg | Freq: Once | INTRAMUSCULAR | Status: AC
  Administered 2022-08-13: 15 mg via INTRAMUSCULAR

## 2022-08-13 NOTE — Discharge Instructions (Addendum)
I am not able to refill your medication.  Toradol shot was given today.  Do not take any ibuprofen, Advil, Aleve for at least 24 hours following injection.  Follow-up with your pain management doctor and spine specialty for further refills.  Please monitor blood pressure closely at home and take medication as prescribed.  Follow-up family medicine doctor or urgent care if blood pressure remains elevated.

## 2022-08-13 NOTE — ED Provider Notes (Signed)
[facility]    CSN: [id number] Arrival date & time: 08/13/22  1411      History   Chief Complaint Chief Complaint  Patient presents with   Extremity Pain    HPI Tim [name] is a 47 y.o. male.   Patient presents today for medication refill for oxycodone and gabapentin.  He reports that he takes this daily and as needed and is followed by pain management.  He had a neck injury approximately 2 years ago where a piece of a metal bed fell on his neck.  He had surgery to fix it but has had chronic pain.  He reports he has posterior neck pain that radiates down the bilateral arms.  This pain is typical for his chronic pain.  It flared up a few days ago when he ran out of his medication.  Patient states that he recently saw pain management doctor on 4/29 who changed his hydrocodone to oxycodone to see if this will be more helpful.  He reports that they gave him a 5-day supply and he is now ran out of it.  Denies any recent injuries to the neck or arms.  His blood pressure is also significantly elevated.  He reports that he takes amlodipine daily and is supposed to be taking lisinopril that was recently prescribed 1 week ago but he has not started taking it given insurance complications.  He states that he is going today to try to pick it up as the pharmacy has been working with his insurance to try to get it covered.  Patient not reporting any chest pain, shortness of breath, palpitations, nausea, vomiting, diarrhea, blurred vision.   Extremity Pain    Past Medical History:  Diagnosis Date   Asthma    Chronic combined systolic (congestive) and diastolic (congestive) heart failure (HCC)    Coronary artery disease    Hypertension    Noncompliance    Polysubstance abuse [facility])     Patient Active Problem List   Diagnosis Date Noted   Cervical stenosis of spine    Cord compression (HCC)    Leukopenia    Central cord syndrome (HCC) 08/26/2019   Chronic combined systolic  (congestive) and diastolic (congestive) heart failure (HCC) 08/26/2019   Obesity (BMI 30.0-34.9) 08/26/2019   Tobacco dependence 08/26/2019   Dilated cardiomyopathy (HCC) 10/04/2017   Polysubstance abuse (HCC) 10/04/2017   OD (overdose of drug) 09/22/2017   Elevated troponin 09/22/2017   Essential hypertension     Past Surgical History:  Procedure Laterality Date   ANTERIOR CERVICAL DECOMP/DISCECTOMY FUSION N/A 08/27/2019   Procedure: CERVICAL FOUR-FIVE ANTERIOR CERVICAL DECOMPRESSION/DISCECTOMY FUSION;  Surgeon: Jadene Pierini, MD;  Location: [facility] OR;  Service: Neurosurgery;  Laterality: N/A;   NECK SURGERY     TRANSTHORACIC ECHOCARDIOGRAM  09/23/2017   EF 20 to 25% with diffuse hypokinesis.  GR 1 DD.  Kinesis was more noted in the anterior and anterolateral wall   WISDOM TOOTH EXTRACTION         Home Medications    Prior to Admission medications   Medication Sig Start Date End Date Taking? Authorizing Provider  HYDROcodone-acetaminophen (NORCO/VICODIN) 5-325 MG tablet TAKE 1 TABLET BY MOUTH UP TO TWICE DAILY AS NEEDED FOR CERVICALGIA AS DIRECTED 07/06/22  Yes [provider]  amLODipine (NORVASC) 5 MG tablet Take 1 tablet (5 mg total) by mouth daily. 04/11/22 05/11/22  Carlisle Beers, FNP  chlorhexidine (PERIDEX) 0.12 % solution Use as directed 15 mLs in the  mouth or throat 2 (two) times daily. 06/30/21   Mardella Layman, MD  cyclobenzaprine (FLEXERIL) 10 MG tablet Take 1 tablet (10 mg total) by mouth 3 (three) times daily as needed for muscle spasms. 08/29/19   Jadene Pierini, MD  gabapentin (NEURONTIN) 300 MG capsule Take 1 capsule (300 mg total) by mouth 3 (three) times daily. 08/29/19   Jadene Pierini, MD  lidocaine (XYLOCAINE) 2 % solution Use as directed 15 mLs in the mouth or throat as needed for mouth pain. 11/01/20   Ivette Loyal, NP  lisinopril (ZESTRIL) 10 MG tablet Take 1 tablet (10 mg total) by mouth daily. 08/30/19   Jadene Pierini, MD   naproxen (NAPROSYN) 500 MG tablet Take 1 tablet (500 mg total) by mouth 2 (two) times daily. 06/30/22   Garlon Hatchet, PA-C    Family History Family History  Problem Relation Age of Onset   Hypertension Mother    Alcoholism Father     Social History Social History   Tobacco Use   Smoking status: Some Days    Types: Cigarettes   Smokeless tobacco: Never  Vaping Use   Vaping Use: Never used  Substance Use Topics   Alcohol use: Yes    Comment: occassionally, intermittent heavy drinking   Drug use: Not Currently    Types: Cocaine, Marijuana    Comment: "barely" uses marijuana, denies cocaine (prior UDS + cocaine and opiates     Allergies   Patient has no known allergies.   Review of Systems Review of Systems Per HPI  Physical Exam Triage Vital Signs ED Triage Vitals  Enc Vitals Group     BP 08/13/22 1521 (!) 174/125     Pulse Rate 08/13/22 1521 (!) 104     Resp 08/13/22 1521 18     Temp 08/13/22 1521 98.7 F (37.1 C)     Temp Source 08/13/22 1521 Oral     SpO2 --      Weight --      Height --      Head Circumference --      Peak Flow --      Pain Score 08/13/22 1524 7     Pain Loc --      Pain Edu? --      Excl. in GC? --    No data found.  Updated Vital Signs BP (!) 166/126 (BP Location: Left Arm)   Pulse (!) 104   Temp 98.7 F (37.1 C) (Oral)   Resp 18   Visual Acuity Right Eye Distance:   Left Eye Distance:   Bilateral Distance:    Right Eye Near:   Left Eye Near:    Bilateral Near:     Physical Exam Constitutional:      General: He is not in acute distress.    Appearance: Normal appearance. He is not toxic-appearing or diaphoretic.  HENT:     Head: Normocephalic and atraumatic.  Eyes:     Extraocular Movements: Extraocular movements intact.     Conjunctiva/sclera: Conjunctivae normal.  Neck:     Comments: Patient reports mild tenderness to palpation to right lateral neck that extends slightly into the trapezius muscle.  No obvious  direct spinal tenderness, crepitus, step-off noted.  Full range of motion of neck present.  Patient has full range of motion of upper extremities.  Grip strength 5/5.  Neurovascularly intact. Pulmonary:     Effort: Pulmonary effort is normal.  Neurological:     General:  No focal deficit present.     Mental Status: He is alert and oriented to person, place, and time. Mental status is at baseline.  Psychiatric:        Mood and Affect: Mood normal.        Behavior: Behavior normal.        Thought Content: Thought content normal.        Judgment: Judgment normal.      UC Treatments / Results  Labs (all labs ordered are listed, but only abnormal results are displayed) Labs Reviewed - No data to display  EKG   Radiology No results found.  Procedures Procedures (including critical care time)  Medications Ordered in UC Medications  ketorolac (TORADOL) 15 MG/ML injection 15 mg (15 mg Intramuscular Given 08/13/22 1551)  ketorolac (TORADOL) 15 MG/ML injection 15 mg (15 mg Intramuscular Given 08/13/22 1550)    Initial Impression / Assessment and Plan / UC Course  I have reviewed the triage vital signs and the nursing notes.  Pertinent labs & imaging results that were available during my care of the patient were reviewed by me and considered in my medical decision making (see chart for details).     Patient here for flareup of chronic neck pain which appears to be possibly cervical radiculopathy.  Patient requesting refill on oxycodone and gabapentin.  After further review of PDMP, it appears the patient should not yet be out of oxycodone as he was given a 7-day supply on 4/30.  Discussed with patient that I am not able to refill these medications given they are controlled substances and he is followed by pain management.  Will treat with IM Toradol today to help alleviate patient's pain until he can see pain management as he reports that he has an appointment with them in a few days.   Patient's latest creatinine in 2021 was 1.33 with no recent blood work able to be visualized.  Patient recently took naproxen a few months prior prescribed by different MD so I do think that 1 dose of IM Toradol would be safe today to help alleviate patient's pain.  Advise no NSAIDs for at least 24 hours following injection.  Blood pressure elevated as well as heart rate slightly but suspect this is due to pain and patient not taking his blood pressure medication appropriately.  Patient appears asymptomatic regarding blood pressure so do not think that emergent evaluation is necessary.  Patient encouraged to take medication as prescribed and follow-up with family medicine or urgent care if it remains elevated.  Patient verbalized understanding and was agreeable with plan. Final Clinical Impressions(s) / UC Diagnoses   Final diagnoses:  None     Discharge Instructions      I am not able to refill your medication.  Toradol shot was given today.  Do not take any ibuprofen, Advil, Aleve for at least 24 hours following injection.  Follow-up with your pain management doctor and spine specialty for further refills.  Please monitor blood pressure closely at home and take medication as prescribed.  Follow-up family medicine doctor or urgent care if blood pressure remains elevated.    ED Prescriptions   None    I have reviewed the PDMP during this encounter.   Gustavus Bryant, [place] 08/13/22 1626

## 2022-08-13 NOTE — ED Triage Notes (Addendum)
Patient presents to UC for chronic right arm pain from flare up x 3 days ago. States pain is at the shoulder and radiates to lower arm. Describes it a 7/10 burning shooting sensation. States he is out of his norco and gabapentin, has an appt next week at the pain management clinic. Has not taken any OTC meds.

## 2022-11-16 ENCOUNTER — Encounter: Payer: Self-pay | Admitting: Neurology

## 2022-11-22 ENCOUNTER — Ambulatory Visit: Payer: Commercial Managed Care - HMO | Admitting: Neurology

## 2022-11-22 ENCOUNTER — Encounter: Payer: Self-pay | Admitting: Neurology

## 2022-12-19 ENCOUNTER — Ambulatory Visit: Payer: Commercial Managed Care - HMO | Admitting: Neurology

## 2023-04-25 ENCOUNTER — Ambulatory Visit (HOSPITAL_COMMUNITY): Payer: Commercial Managed Care - HMO

## 2023-05-08 ENCOUNTER — Other Ambulatory Visit: Payer: Self-pay

## 2023-05-08 ENCOUNTER — Emergency Department (HOSPITAL_COMMUNITY)
Admission: EM | Admit: 2023-05-08 | Discharge: 2023-05-08 | Disposition: A | Discharge status: Home / Self Care | Payer: Commercial Managed Care - HMO | Attending: Emergency Medicine | Admitting: Emergency Medicine

## 2023-05-08 ENCOUNTER — Encounter (HOSPITAL_COMMUNITY): Payer: Self-pay

## 2023-05-08 DIAGNOSIS — R369 Urethral discharge, unspecified: Secondary | ICD-10-CM | POA: Insufficient documentation

## 2023-05-08 LAB — HIV ANTIBODY (ROUTINE TESTING W REFLEX): HIV Screen 4th Generation wRfx: NONREACTIVE

## 2023-05-08 MED ORDER — AZITHROMYCIN 250 MG PO TABS
1000.0000 mg | ORAL_TABLET | Freq: Once | ORAL | Status: AC
  Administered 2023-05-08: 1000 mg via ORAL
  Filled 2023-05-08: qty 4

## 2023-05-08 MED ORDER — CEFTRIAXONE SODIUM 1 G IJ SOLR
500.0000 mg | Freq: Once | INTRAMUSCULAR | Status: AC
  Administered 2023-05-08: 500 mg via INTRAMUSCULAR
  Filled 2023-05-08: qty 10

## 2023-05-08 MED ORDER — METRONIDAZOLE 500 MG PO TABS
2000.0000 mg | ORAL_TABLET | Freq: Once | ORAL | Status: AC
  Administered 2023-05-08: 2000 mg via ORAL
  Filled 2023-05-08: qty 4

## 2023-05-08 NOTE — ED Triage Notes (Signed)
Discahrge from penis for a couple of weeks. Denies pains or any other symptoms

## 2023-05-08 NOTE — ED Provider Notes (Signed)
[place] EMERGENCY DEPARTMENT AT [facility] Provider Note   CSN: [id number] Arrival date & time: 05/08/23  1421     History  Chief Complaint  Patient presents with   Penile Discharge    Tim [name] is a 48 y.o. male.  49 yo M with a chief complaint of penile discharge.  Going on for about a week.  His wife is now here with abdominal pain.  He denies any pain to the penis or testicles.  Denies inguinal lymphadenopathy denies rash.   Penile Discharge       Home Medications Prior to Admission medications   Medication Sig Start Date End Date Taking? Authorizing Provider  amLODipine (NORVASC) 5 MG tablet Take 1 tablet (5 mg total) by mouth daily. 04/11/22 05/11/22  Carlisle Beers, FNP  chlorhexidine (PERIDEX) 0.12 % solution Use as directed 15 mLs in the mouth or throat 2 (two) times daily. 06/30/21   Mardella Layman, MD  cyclobenzaprine (FLEXERIL) 10 MG tablet Take 1 tablet (10 mg total) by mouth 3 (three) times daily as needed for muscle spasms. 08/29/19   Jadene Pierini, MD  gabapentin (NEURONTIN) 300 MG capsule Take 1 capsule (300 mg total) by mouth 3 (three) times daily. 08/29/19   Jadene Pierini, MD  HYDROcodone-acetaminophen (NORCO/VICODIN) 5-325 MG tablet TAKE 1 TABLET BY MOUTH UP TO TWICE DAILY AS NEEDED FOR CERVICALGIA AS DIRECTED 07/06/22   [provider]  lidocaine (XYLOCAINE) 2 % solution Use as directed 15 mLs in the mouth or throat as needed for mouth pain. 11/01/20   Ivette Loyal, NP  lisinopril (ZESTRIL) 10 MG tablet Take 1 tablet (10 mg total) by mouth daily. 08/30/19   Jadene Pierini, MD  naproxen (NAPROSYN) 500 MG tablet Take 1 tablet (500 mg total) by mouth 2 (two) times daily. 06/30/22   Garlon Hatchet, PA-C      Allergies    Patient has no known allergies.    Review of Systems   Review of Systems  Genitourinary:  Positive for penile discharge.    Physical Exam Updated Vital Signs Pulse 79   Temp 98.1 F  (36.7 C) (Oral)   Resp 16   Ht 6' (1.829 m)   Wt 108.9 kg   SpO2 100%   BMI 32.56 kg/m  Physical Exam Vitals and nursing note reviewed.  Constitutional:      Appearance: He is well-developed.  HENT:     Head: Normocephalic and atraumatic.  Eyes:     Pupils: Pupils are equal, round, and reactive to light.  Neck:     Vascular: No JVD.  Cardiovascular:     Rate and Rhythm: Normal rate and regular rhythm.     Heart sounds: No murmur heard.    No friction rub. No gallop.  Pulmonary:     Effort: No respiratory distress.     Breath sounds: No wheezing.  Abdominal:     General: There is no distension.     Tenderness: There is no abdominal tenderness. There is no guarding or rebound.  Genitourinary:    Penis: Circumcised.      Comments: Cremasteric reflex intact bilaterally.  No obvious testicular pain or masses.  No inguinal lymphadenopathy.  No rash. Musculoskeletal:        General: Normal range of motion.     Cervical back: Normal range of motion and neck supple.  Skin:    Coloration: Skin is not pale.     Findings: No  rash.  Neurological:     Mental Status: He is alert and oriented to person, place, and time.  Psychiatric:        Behavior: Behavior normal.     ED Results / Procedures / Treatments   Labs (all labs ordered are listed, but only abnormal results are displayed) Labs Reviewed  RPR  HIV ANTIBODY (ROUTINE TESTING W REFLEX)  GC/CHLAMYDIA PROBE AMP ([place]) NOT AT [facility]    EKG None  Radiology No results found.  Procedures Procedures    Medications Ordered in ED Medications  cefTRIAXone (ROCEPHIN) injection 500 mg (has no administration in time range)  metroNIDAZOLE (FLAGYL) tablet 2,000 mg (has no administration in time range)  azithromycin (ZITHROMAX) tablet 1,000 mg (has no administration in time range)    ED Course/ Medical Decision Making/ A&P                                 Medical Decision Making Amount and/or Complexity of Data  Reviewed Labs: ordered.  Risk Prescription drug management.   48 yo M with chief complaints of penile discharge.  This has been going on for about a week.  Will test and treat presumptively for sexually transmitted diseases.  PCP follow-up.  2:36 PM:  I have discussed the diagnosis/risks/treatment options with the patient.  Evaluation and diagnostic testing in the emergency department does not suggest an emergent condition requiring admission or immediate intervention beyond what has been performed at this time.  They will follow up with PCP. We also discussed returning to the ED immediately if new or worsening sx occur. We discussed the sx which are most concerning (e.g., sudden worsening pain, fever, inability to tolerate by mouth) that necessitate immediate return. Medications administered to the patient during their visit and any new prescriptions provided to the patient are listed below.  Medications given during this visit Medications  cefTRIAXone (ROCEPHIN) injection 500 mg (has no administration in time range)  metroNIDAZOLE (FLAGYL) tablet 2,000 mg (has no administration in time range)  azithromycin (ZITHROMAX) tablet 1,000 mg (has no administration in time range)     The patient appears reasonably screen and/or stabilized for discharge and I doubt any other medical condition or other [facility] requiring further screening, evaluation, or treatment in the ED at this time prior to discharge.          Final Clinical Impression(s) / ED Diagnoses Final diagnoses:  Penile discharge    Rx / DC Orders ED Discharge Orders     None         Melene Plan, DO 05/08/23 1436

## 2023-05-08 NOTE — Discharge Instructions (Addendum)
Follow up with your doctor in the office.  If you are positive please let your partners up to 90 days before today know so they can be tested and treated.

## 2023-05-09 LAB — RPR: RPR Ser Ql: NONREACTIVE

## 2023-05-11 LAB — GC/CHLAMYDIA PROBE AMP (~~LOC~~) NOT AT ARMC
Chlamydia: NEGATIVE
Comment: NEGATIVE
Comment: NORMAL
Neisseria Gonorrhea: NEGATIVE

## 2024-04-18 ENCOUNTER — Emergency Department (HOSPITAL_COMMUNITY)
Admission: EM | Admit: 2024-04-18 | Discharge: 2024-04-19 | Disposition: A | Discharge status: Home / Self Care | Attending: Emergency Medicine | Admitting: Emergency Medicine

## 2024-04-18 ENCOUNTER — Encounter (HOSPITAL_COMMUNITY): Payer: Self-pay

## 2024-04-18 ENCOUNTER — Emergency Department (HOSPITAL_COMMUNITY)

## 2024-04-18 ENCOUNTER — Other Ambulatory Visit: Payer: Self-pay

## 2024-04-18 DIAGNOSIS — R6883 Chills (without fever): Secondary | ICD-10-CM | POA: Insufficient documentation

## 2024-04-18 DIAGNOSIS — D72829 Elevated white blood cell count, unspecified: Secondary | ICD-10-CM | POA: Insufficient documentation

## 2024-04-18 DIAGNOSIS — I110 Hypertensive heart disease with heart failure: Secondary | ICD-10-CM | POA: Diagnosis not present

## 2024-04-18 DIAGNOSIS — R112 Nausea with vomiting, unspecified: Secondary | ICD-10-CM | POA: Diagnosis not present

## 2024-04-18 DIAGNOSIS — W503XXA Accidental bite by another person, initial encounter: Secondary | ICD-10-CM

## 2024-04-18 DIAGNOSIS — R197 Diarrhea, unspecified: Secondary | ICD-10-CM | POA: Diagnosis not present

## 2024-04-18 DIAGNOSIS — M791 Myalgia, unspecified site: Secondary | ICD-10-CM | POA: Diagnosis not present

## 2024-04-18 DIAGNOSIS — R519 Headache, unspecified: Secondary | ICD-10-CM | POA: Diagnosis not present

## 2024-04-18 DIAGNOSIS — I5022 Chronic systolic (congestive) heart failure: Secondary | ICD-10-CM | POA: Diagnosis not present

## 2024-04-18 DIAGNOSIS — Z79899 Other long term (current) drug therapy: Secondary | ICD-10-CM | POA: Diagnosis not present

## 2024-04-18 DIAGNOSIS — R109 Unspecified abdominal pain: Secondary | ICD-10-CM | POA: Diagnosis not present

## 2024-04-18 DIAGNOSIS — S41152A Open bite of left upper arm, initial encounter: Secondary | ICD-10-CM | POA: Diagnosis present

## 2024-04-18 LAB — CBC
HCT: 54.2 % — ABNORMAL HIGH (ref 39.0–52.0)
Hemoglobin: 18.4 g/dL — ABNORMAL HIGH (ref 13.0–17.0)
MCH: 31.6 pg (ref 26.0–34.0)
MCHC: 33.9 g/dL (ref 30.0–36.0)
MCV: 93.1 fL (ref 80.0–100.0)
Platelets: 267 K/uL (ref 150–400)
RBC: 5.82 MIL/uL — ABNORMAL HIGH (ref 4.22–5.81)
RDW: 17.5 % — ABNORMAL HIGH (ref 11.5–15.5)
WBC: 16 K/uL — ABNORMAL HIGH (ref 4.0–10.5)
nRBC: 0 % (ref 0.0–0.2)

## 2024-04-18 LAB — COMPREHENSIVE METABOLIC PANEL WITH GFR
ALT: 18 U/L (ref 0–44)
AST: 31 U/L (ref 15–41)
Albumin: 4.6 g/dL (ref 3.5–5.0)
Alkaline Phosphatase: 108 U/L (ref 38–126)
Anion gap: 14 (ref 5–15)
BUN: 20 mg/dL (ref 6–20)
CO2: 21 mmol/L — ABNORMAL LOW (ref 22–32)
Calcium: 9.5 mg/dL (ref 8.9–10.3)
Chloride: 101 mmol/L (ref 98–111)
Creatinine, Ser: 1.64 mg/dL — ABNORMAL HIGH (ref 0.61–1.24)
GFR, Estimated: 51 mL/min — ABNORMAL LOW
Glucose, Bld: 113 mg/dL — ABNORMAL HIGH (ref 70–99)
Potassium: 4.3 mmol/L (ref 3.5–5.1)
Sodium: 135 mmol/L (ref 135–145)
Total Bilirubin: 1.1 mg/dL (ref 0.0–1.2)
Total Protein: 8.1 g/dL (ref 6.5–8.1)

## 2024-04-18 LAB — LIPASE, BLOOD: Lipase: 17 U/L (ref 11–51)

## 2024-04-18 MED ORDER — SODIUM CHLORIDE 0.9 % IV BOLUS
1000.0000 mL | Freq: Once | INTRAVENOUS | Status: AC
  Administered 2024-04-18: 1000 mL via INTRAVENOUS

## 2024-04-18 MED ORDER — ONDANSETRON HCL 4 MG/2ML IJ SOLN
4.0000 mg | Freq: Once | INTRAMUSCULAR | Status: AC
  Administered 2024-04-18: 4 mg via INTRAVENOUS
  Filled 2024-04-18: qty 2

## 2024-04-18 MED ORDER — IOHEXOL 300 MG/ML  SOLN
100.0000 mL | Freq: Once | INTRAMUSCULAR | Status: AC | PRN
  Administered 2024-04-18: 100 mL via INTRAVENOUS

## 2024-04-18 MED ORDER — TETANUS-DIPHTH-ACELL PERTUSSIS 5-2-15.5 LF-MCG/0.5 IM SUSP
0.5000 mL | Freq: Once | INTRAMUSCULAR | Status: AC
  Administered 2024-04-18: 0.5 mL via INTRAMUSCULAR
  Filled 2024-04-18: qty 0.5

## 2024-04-18 NOTE — ED Triage Notes (Signed)
 Pt BIB [facility] for multiple complaints including nausea, vomiting and right lower back pain. Pt hasn't been able to take his medications x 4 days due to vomiting so he is also complaining of chronic neuropathic pain.
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
## 2024-04-19 LAB — RESP PANEL BY RT-PCR (RSV, FLU A&B, COVID)  RVPGX2
Influenza A by PCR: NEGATIVE
Influenza B by PCR: NEGATIVE
Resp Syncytial Virus by PCR: NEGATIVE
SARS Coronavirus 2 by RT PCR: NEGATIVE

## 2024-04-19 MED ORDER — AMOXICILLIN-POT CLAVULANATE 875-125 MG PO TABS: 1.0000 | ORAL_TABLET | Freq: Two times a day (BID) | ORAL | 0 refills | Status: AC

## 2024-04-19 MED ORDER — AMOXICILLIN-POT CLAVULANATE 875-125 MG PO TABS: 1.0000 | ORAL_TABLET | Freq: Two times a day (BID) | ORAL | 0 refills | Status: DC

## 2024-04-19 MED ORDER — ONDANSETRON 4 MG PO TBDP: 4.0000 mg | ORAL_TABLET | Freq: Three times a day (TID) | ORAL | 0 refills | Status: DC | PRN

## 2024-04-19 MED ORDER — GABAPENTIN 300 MG PO CAPS
300.0000 mg | ORAL_CAPSULE | ORAL | Status: AC
  Administered 2024-04-19: 300 mg via ORAL
  Filled 2024-04-19: qty 1

## 2024-04-19 MED ORDER — HYDROCODONE-ACETAMINOPHEN 5-325 MG PO TABS
1.0000 | ORAL_TABLET | Freq: Once | ORAL | Status: AC
  Administered 2024-04-19: 1 via ORAL
  Filled 2024-04-19: qty 1

## 2024-04-19 MED ORDER — AMOXICILLIN-POT CLAVULANATE 875-125 MG PO TABS
1.0000 | ORAL_TABLET | Freq: Once | ORAL | Status: AC
  Administered 2024-04-19: 1 via ORAL
  Filled 2024-04-19: qty 1

## 2024-04-19 MED ORDER — ONDANSETRON 4 MG PO TBDP: 4.0000 mg | ORAL_TABLET | Freq: Three times a day (TID) | ORAL | 0 refills | Status: AC | PRN

## 2024-04-19 NOTE — ED Notes (Signed)
 Pt passed PO challenge. Pt reports no N/V.
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
